# Patient Record
Sex: Female | Born: 1965 | Race: Black or African American | Hispanic: No | Smoking: Never smoker
Health system: Southern US, Community
[De-identification: ages and names within clinical notes are randomized; demographics above are authoritative.]

## PROBLEM LIST (undated history)

## (undated) DIAGNOSIS — E669 Obesity, unspecified: Secondary | ICD-10-CM

## (undated) DIAGNOSIS — F329 Major depressive disorder, single episode, unspecified: Secondary | ICD-10-CM

## (undated) DIAGNOSIS — M1990 Unspecified osteoarthritis, unspecified site: Secondary | ICD-10-CM

## (undated) DIAGNOSIS — K219 Gastro-esophageal reflux disease without esophagitis: Secondary | ICD-10-CM

## (undated) DIAGNOSIS — I10 Essential (primary) hypertension: Secondary | ICD-10-CM

## (undated) DIAGNOSIS — G43909 Migraine, unspecified, not intractable, without status migrainosus: Secondary | ICD-10-CM

## (undated) DIAGNOSIS — F3181 Bipolar II disorder: Secondary | ICD-10-CM

## (undated) DIAGNOSIS — G4730 Sleep apnea, unspecified: Secondary | ICD-10-CM

## (undated) DIAGNOSIS — J329 Chronic sinusitis, unspecified: Secondary | ICD-10-CM

## (undated) DIAGNOSIS — F32A Depression, unspecified: Secondary | ICD-10-CM

## (undated) DIAGNOSIS — M4800 Spinal stenosis, site unspecified: Secondary | ICD-10-CM

## (undated) DIAGNOSIS — M4802 Spinal stenosis, cervical region: Secondary | ICD-10-CM

## (undated) DIAGNOSIS — D259 Leiomyoma of uterus, unspecified: Secondary | ICD-10-CM

## (undated) DIAGNOSIS — G5600 Carpal tunnel syndrome, unspecified upper limb: Secondary | ICD-10-CM

## (undated) HISTORY — PX: ABDOMINAL HYSTERECTOMY: SHX81

## (undated) HISTORY — PX: CARPAL TUNNEL RELEASE: SHX101

## (undated) HISTORY — DX: Leiomyoma of uterus, unspecified: D25.9

## (undated) HISTORY — PX: KNEE SURGERY: SHX244

## (undated) HISTORY — PX: BREAST REDUCTION SURGERY: SHX8

## (undated) HISTORY — DX: Spinal stenosis, cervical region: M48.02

## (undated) HISTORY — PX: MYELOGRAM: SHX5347

---

## 2008-09-17 ENCOUNTER — Emergency Department (HOSPITAL_COMMUNITY): Admission: EM | Admit: 2008-09-17 | Discharge: 2008-09-17 | Payer: Self-pay | Admitting: Family Medicine

## 2008-09-22 ENCOUNTER — Emergency Department (HOSPITAL_COMMUNITY): Admission: EM | Admit: 2008-09-22 | Discharge: 2008-09-22 | Payer: Self-pay | Admitting: Emergency Medicine

## 2008-09-26 ENCOUNTER — Ambulatory Visit (HOSPITAL_COMMUNITY): Admission: RE | Admit: 2008-09-26 | Discharge: 2008-09-26 | Payer: Self-pay | Admitting: Obstetrics

## 2008-11-20 ENCOUNTER — Ambulatory Visit (HOSPITAL_COMMUNITY): Admission: RE | Admit: 2008-11-20 | Discharge: 2008-11-20 | Payer: Self-pay | Admitting: Obstetrics

## 2008-12-28 ENCOUNTER — Emergency Department (HOSPITAL_COMMUNITY): Admission: EM | Admit: 2008-12-28 | Discharge: 2008-12-28 | Payer: Self-pay | Admitting: Emergency Medicine

## 2009-02-08 ENCOUNTER — Emergency Department (HOSPITAL_COMMUNITY): Admission: EM | Admit: 2009-02-08 | Discharge: 2009-02-08 | Payer: Self-pay | Admitting: Emergency Medicine

## 2009-03-14 ENCOUNTER — Emergency Department (HOSPITAL_COMMUNITY): Admission: EM | Admit: 2009-03-14 | Discharge: 2009-03-15 | Payer: Self-pay | Admitting: Emergency Medicine

## 2009-03-31 ENCOUNTER — Emergency Department (HOSPITAL_COMMUNITY): Admission: EM | Admit: 2009-03-31 | Discharge: 2009-03-31 | Payer: Self-pay | Admitting: Emergency Medicine

## 2009-04-04 ENCOUNTER — Emergency Department (HOSPITAL_COMMUNITY): Admission: EM | Admit: 2009-04-04 | Discharge: 2009-04-04 | Payer: Self-pay | Admitting: Emergency Medicine

## 2009-05-01 ENCOUNTER — Encounter: Admission: RE | Admit: 2009-05-01 | Discharge: 2009-05-03 | Payer: Self-pay | Admitting: Orthopaedic Surgery

## 2009-05-08 ENCOUNTER — Encounter: Admission: RE | Admit: 2009-05-08 | Discharge: 2009-05-21 | Payer: Self-pay | Admitting: Orthopaedic Surgery

## 2009-05-29 ENCOUNTER — Encounter: Admission: RE | Admit: 2009-05-29 | Discharge: 2009-05-29 | Payer: Self-pay | Admitting: Orthopaedic Surgery

## 2009-06-07 ENCOUNTER — Emergency Department (HOSPITAL_COMMUNITY): Admission: EM | Admit: 2009-06-07 | Discharge: 2009-06-07 | Payer: Self-pay | Admitting: Emergency Medicine

## 2009-06-22 ENCOUNTER — Emergency Department (HOSPITAL_COMMUNITY): Admission: EM | Admit: 2009-06-22 | Discharge: 2009-06-22 | Payer: Self-pay | Admitting: Emergency Medicine

## 2009-07-25 ENCOUNTER — Ambulatory Visit (HOSPITAL_COMMUNITY): Admission: RE | Admit: 2009-07-25 | Discharge: 2009-07-25 | Payer: Self-pay | Admitting: Obstetrics

## 2009-12-06 ENCOUNTER — Emergency Department (HOSPITAL_COMMUNITY): Admission: EM | Admit: 2009-12-06 | Discharge: 2009-12-07 | Payer: Self-pay | Admitting: General Surgery

## 2010-02-12 ENCOUNTER — Encounter: Admission: RE | Admit: 2010-02-12 | Discharge: 2010-02-12 | Payer: Self-pay | Admitting: Orthopaedic Surgery

## 2010-05-01 ENCOUNTER — Ambulatory Visit (HOSPITAL_COMMUNITY)
Admission: RE | Admit: 2010-05-01 | Discharge: 2010-05-01 | Payer: Self-pay | Source: Home / Self Care | Attending: Obstetrics | Admitting: Obstetrics

## 2010-05-25 ENCOUNTER — Encounter: Payer: Self-pay | Admitting: Gastroenterology

## 2010-06-28 ENCOUNTER — Emergency Department (HOSPITAL_COMMUNITY)
Admission: EM | Admit: 2010-06-28 | Discharge: 2010-06-28 | Disposition: A | Discharge status: Home / Self Care | Payer: Medicaid Other | Attending: Emergency Medicine | Admitting: Emergency Medicine

## 2010-06-28 DIAGNOSIS — R209 Unspecified disturbances of skin sensation: Secondary | ICD-10-CM | POA: Insufficient documentation

## 2010-06-28 DIAGNOSIS — M542 Cervicalgia: Secondary | ICD-10-CM | POA: Insufficient documentation

## 2010-06-28 DIAGNOSIS — M25519 Pain in unspecified shoulder: Secondary | ICD-10-CM | POA: Insufficient documentation

## 2010-06-28 DIAGNOSIS — M545 Low back pain, unspecified: Secondary | ICD-10-CM | POA: Insufficient documentation

## 2010-06-28 DIAGNOSIS — M546 Pain in thoracic spine: Secondary | ICD-10-CM | POA: Insufficient documentation

## 2010-06-28 DIAGNOSIS — I10 Essential (primary) hypertension: Secondary | ICD-10-CM | POA: Insufficient documentation

## 2010-06-28 DIAGNOSIS — M5380 Other specified dorsopathies, site unspecified: Secondary | ICD-10-CM | POA: Insufficient documentation

## 2010-06-28 DIAGNOSIS — E669 Obesity, unspecified: Secondary | ICD-10-CM | POA: Insufficient documentation

## 2010-06-28 DIAGNOSIS — G8929 Other chronic pain: Secondary | ICD-10-CM | POA: Insufficient documentation

## 2010-06-28 DIAGNOSIS — K219 Gastro-esophageal reflux disease without esophagitis: Secondary | ICD-10-CM | POA: Insufficient documentation

## 2010-07-18 LAB — URINALYSIS, ROUTINE W REFLEX MICROSCOPIC
Glucose, UA: NEGATIVE mg/dL
Ketones, ur: NEGATIVE mg/dL
Nitrite: NEGATIVE
Protein, ur: NEGATIVE mg/dL
Urobilinogen, UA: 1 mg/dL (ref 0.0–1.0)

## 2010-07-18 LAB — POCT PREGNANCY, URINE: Preg Test, Ur: NEGATIVE

## 2010-07-19 ENCOUNTER — Emergency Department (HOSPITAL_COMMUNITY): Payer: Medicaid Other

## 2010-07-19 ENCOUNTER — Emergency Department (HOSPITAL_COMMUNITY)
Admission: EM | Admit: 2010-07-19 | Discharge: 2010-07-20 | Disposition: A | Discharge status: Home / Self Care | Payer: Medicaid Other | Attending: Emergency Medicine | Admitting: Emergency Medicine

## 2010-07-19 DIAGNOSIS — I10 Essential (primary) hypertension: Secondary | ICD-10-CM | POA: Insufficient documentation

## 2010-07-19 DIAGNOSIS — R059 Cough, unspecified: Secondary | ICD-10-CM | POA: Insufficient documentation

## 2010-07-19 DIAGNOSIS — R0789 Other chest pain: Secondary | ICD-10-CM | POA: Insufficient documentation

## 2010-07-19 DIAGNOSIS — R0609 Other forms of dyspnea: Secondary | ICD-10-CM | POA: Insufficient documentation

## 2010-07-19 DIAGNOSIS — R05 Cough: Secondary | ICD-10-CM | POA: Insufficient documentation

## 2010-07-19 DIAGNOSIS — K219 Gastro-esophageal reflux disease without esophagitis: Secondary | ICD-10-CM | POA: Insufficient documentation

## 2010-07-19 DIAGNOSIS — R0989 Other specified symptoms and signs involving the circulatory and respiratory systems: Secondary | ICD-10-CM | POA: Insufficient documentation

## 2010-07-19 DIAGNOSIS — R091 Pleurisy: Secondary | ICD-10-CM | POA: Insufficient documentation

## 2010-07-19 LAB — POCT I-STAT, CHEM 8
Calcium, Ion: 1.15 mmol/L (ref 1.12–1.32)
Glucose, Bld: 112 mg/dL — ABNORMAL HIGH (ref 70–99)
HCT: 33 % — ABNORMAL LOW (ref 36.0–46.0)
Hemoglobin: 11.2 g/dL — ABNORMAL LOW (ref 12.0–15.0)
TCO2: 23 mmol/L (ref 0–100)

## 2010-07-19 LAB — CBC
MCV: 90.9 fL (ref 78.0–100.0)
Platelets: 241 10*3/uL (ref 150–400)
RBC: 3.41 MIL/uL — ABNORMAL LOW (ref 3.87–5.11)
RDW: 13.7 % (ref 11.5–15.5)
WBC: 6.3 10*3/uL (ref 4.0–10.5)

## 2010-07-19 LAB — DIFFERENTIAL
Basophils Absolute: 0 10*3/uL (ref 0.0–0.1)
Basophils Relative: 0 % (ref 0–1)
Eosinophils Absolute: 0 10*3/uL (ref 0.0–0.7)
Eosinophils Relative: 1 % (ref 0–5)
Lymphs Abs: 2.4 10*3/uL (ref 0.7–4.0)
Neutrophils Relative %: 56 % (ref 43–77)

## 2010-07-20 LAB — D-DIMER, QUANTITATIVE: D-Dimer, Quant: 0.27 ug/mL-FEU (ref 0.00–0.48)

## 2010-07-23 LAB — CBC
HCT: 35.5 % — ABNORMAL LOW (ref 36.0–46.0)
Hemoglobin: 12.3 g/dL (ref 12.0–15.0)
MCHC: 33.8 g/dL (ref 30.0–36.0)
MCHC: 34.5 g/dL (ref 30.0–36.0)
MCV: 92.5 fL (ref 78.0–100.0)
MCV: 95.2 fL (ref 78.0–100.0)
Platelets: 213 10*3/uL (ref 150–400)
RBC: 3.84 MIL/uL — ABNORMAL LOW (ref 3.87–5.11)
WBC: 5.9 10*3/uL (ref 4.0–10.5)

## 2010-07-23 LAB — URINALYSIS, ROUTINE W REFLEX MICROSCOPIC
Bilirubin Urine: NEGATIVE
Glucose, UA: NEGATIVE mg/dL
Hgb urine dipstick: NEGATIVE
Hgb urine dipstick: NEGATIVE
Nitrite: NEGATIVE
Protein, ur: NEGATIVE mg/dL
Specific Gravity, Urine: 1.023 (ref 1.005–1.030)
Specific Gravity, Urine: 1.029 (ref 1.005–1.030)
Urobilinogen, UA: 1 mg/dL (ref 0.0–1.0)
pH: 6.5 (ref 5.0–8.0)

## 2010-07-23 LAB — COMPREHENSIVE METABOLIC PANEL
ALT: 19 U/L (ref 0–35)
ALT: 19 U/L (ref 0–35)
AST: 18 U/L (ref 0–37)
Albumin: 3.7 g/dL (ref 3.5–5.2)
CO2: 25 mEq/L (ref 19–32)
Calcium: 8.7 mg/dL (ref 8.4–10.5)
Calcium: 8.9 mg/dL (ref 8.4–10.5)
Chloride: 106 mEq/L (ref 96–112)
Creatinine, Ser: 0.58 mg/dL (ref 0.4–1.2)
Creatinine, Ser: 0.64 mg/dL (ref 0.4–1.2)
GFR calc Af Amer: >60 mL/min (ref >60)
GFR calc non Af Amer: >60 mL/min (ref >60)
Glucose, Bld: 103 mg/dL — ABNORMAL HIGH (ref 70–99)
Sodium: 135 mEq/L (ref 135–145)
Sodium: 139 mEq/L (ref 135–145)

## 2010-07-23 LAB — LIPASE, BLOOD: Lipase: 20 U/L (ref 11–59)

## 2010-07-23 LAB — DIFFERENTIAL
Basophils Relative: 0 % (ref 0–1)
Eosinophils Absolute: 0.1 10*3/uL (ref 0.0–0.7)
Eosinophils Relative: 1 % (ref 0–5)
Lymphocytes Relative: 50 % — ABNORMAL HIGH (ref 12–46)
Lymphs Abs: 1.2 10*3/uL (ref 0.7–4.0)
Lymphs Abs: 3 10*3/uL (ref 0.7–4.0)
Monocytes Absolute: 0.5 10*3/uL (ref 0.1–1.0)
Neutrophils Relative %: 41 % — ABNORMAL LOW (ref 43–77)

## 2010-08-05 LAB — COMPREHENSIVE METABOLIC PANEL
ALT: 34 U/L (ref 0–35)
AST: 22 U/L (ref 0–37)
Albumin: 3.8 g/dL (ref 3.5–5.2)
Alkaline Phosphatase: 62 U/L (ref 39–117)
Chloride: 104 mEq/L (ref 96–112)
GFR calc Af Amer: >60 mL/min (ref >60)
Potassium: 3.7 mEq/L (ref 3.5–5.1)
Sodium: 138 mEq/L (ref 135–145)
Total Bilirubin: 0.6 mg/dL (ref 0.3–1.2)

## 2010-08-05 LAB — D-DIMER, QUANTITATIVE: D-Dimer, Quant: <0.22 ug/mL-FEU (ref 0.00–0.48)

## 2010-08-05 LAB — DIFFERENTIAL
Basophils Absolute: 0 10*3/uL (ref 0.0–0.1)
Basophils Relative: 0 % (ref 0–1)
Eosinophils Relative: 0 % (ref 0–5)
Monocytes Absolute: 0.2 10*3/uL (ref 0.1–1.0)

## 2010-08-05 LAB — CBC
HCT: 36.1 % (ref 36.0–46.0)
Platelets: 267 10*3/uL (ref 150–400)
WBC: 7.8 10*3/uL (ref 4.0–10.5)

## 2010-08-05 LAB — POCT CARDIAC MARKERS
CKMB, poc: <1 ng/mL — ABNORMAL LOW (ref 1.0–8.0)
Myoglobin, poc: 53.3 ng/mL (ref 12–200)

## 2010-08-06 LAB — POCT PREGNANCY, URINE: Preg Test, Ur: NEGATIVE

## 2010-08-06 LAB — DIFFERENTIAL
Basophils Relative: 1 % (ref 0–1)
Eosinophils Absolute: 0.1 10*3/uL (ref 0.0–0.7)
Eosinophils Relative: 2 % (ref 0–5)
Monocytes Relative: 9 % (ref 3–12)
Neutrophils Relative %: 41 % — ABNORMAL LOW (ref 43–77)

## 2010-08-06 LAB — BASIC METABOLIC PANEL
BUN: 13 mg/dL (ref 6–23)
CO2: 24 mEq/L (ref 19–32)
Chloride: 105 mEq/L (ref 96–112)
Creatinine, Ser: 0.68 mg/dL (ref 0.4–1.2)
GFR calc Af Amer: >60 mL/min (ref >60)
Potassium: 4.1 mEq/L (ref 3.5–5.1)

## 2010-08-06 LAB — CBC
HCT: 34.5 % — ABNORMAL LOW (ref 36.0–46.0)
MCHC: 34.3 g/dL (ref 30.0–36.0)
MCV: 93.2 fL (ref 78.0–100.0)
RBC: 3.71 MIL/uL — ABNORMAL LOW (ref 3.87–5.11)

## 2010-08-06 LAB — URINALYSIS, ROUTINE W REFLEX MICROSCOPIC
Glucose, UA: NEGATIVE mg/dL
Hgb urine dipstick: NEGATIVE
Specific Gravity, Urine: 1.024 (ref 1.005–1.030)
pH: 7 (ref 5.0–8.0)

## 2010-08-06 LAB — POCT CARDIAC MARKERS
CKMB, poc: <1 ng/mL — ABNORMAL LOW (ref 1.0–8.0)
Myoglobin, poc: 56.7 ng/mL (ref 12–200)
Myoglobin, poc: 60.9 ng/mL (ref 12–200)
Troponin i, poc: <0.05 ng/mL (ref 0.00–0.09)

## 2010-08-09 LAB — CBC
Hemoglobin: 11.6 g/dL — ABNORMAL LOW (ref 12.0–15.0)
Platelets: 191 10*3/uL (ref 150–400)
RDW: 12.9 % (ref 11.5–15.5)

## 2010-08-09 LAB — DIFFERENTIAL
Basophils Absolute: 0 10*3/uL (ref 0.0–0.1)
Lymphocytes Relative: 48 % — ABNORMAL HIGH (ref 12–46)
Neutro Abs: 1.8 10*3/uL (ref 1.7–7.7)

## 2010-08-09 LAB — URINE CULTURE

## 2010-08-09 LAB — URINALYSIS, ROUTINE W REFLEX MICROSCOPIC
Bilirubin Urine: NEGATIVE
Glucose, UA: NEGATIVE mg/dL
Ketones, ur: 15 mg/dL — AB
Protein, ur: NEGATIVE mg/dL

## 2010-08-09 LAB — BASIC METABOLIC PANEL
BUN: 7 mg/dL (ref 6–23)
Calcium: 9.2 mg/dL (ref 8.4–10.5)
GFR calc non Af Amer: >60 mL/min (ref >60)
Glucose, Bld: 93 mg/dL (ref 70–99)
Sodium: 137 mEq/L (ref 135–145)

## 2010-08-09 LAB — GC/CHLAMYDIA PROBE AMP, GENITAL: GC Probe Amp, Genital: NEGATIVE

## 2010-08-09 LAB — WET PREP, GENITAL: Yeast Wet Prep HPF POC: NONE SEEN

## 2010-08-09 LAB — URINE MICROSCOPIC-ADD ON

## 2010-08-09 LAB — PREGNANCY, URINE: Preg Test, Ur: NEGATIVE

## 2010-08-12 LAB — URINALYSIS, ROUTINE W REFLEX MICROSCOPIC
Nitrite: NEGATIVE
Specific Gravity, Urine: 1.031 — ABNORMAL HIGH (ref 1.005–1.030)
Urobilinogen, UA: 1 mg/dL (ref 0.0–1.0)

## 2010-08-12 LAB — WET PREP, GENITAL: Yeast Wet Prep HPF POC: NONE SEEN

## 2010-08-12 LAB — URINE MICROSCOPIC-ADD ON

## 2010-08-12 LAB — POCT PREGNANCY, URINE: Preg Test, Ur: NEGATIVE

## 2010-08-12 LAB — GC/CHLAMYDIA PROBE AMP, GENITAL: GC Probe Amp, Genital: NEGATIVE

## 2010-08-16 IMAGING — CR DG CHEST 2V
2 series · 2 of 2 positions shown · non-contrast
Comparison: 03/14/2009

CLINICAL DATA: Shortness of breath.

CHEST - 2 VIEW

[w chest pa]
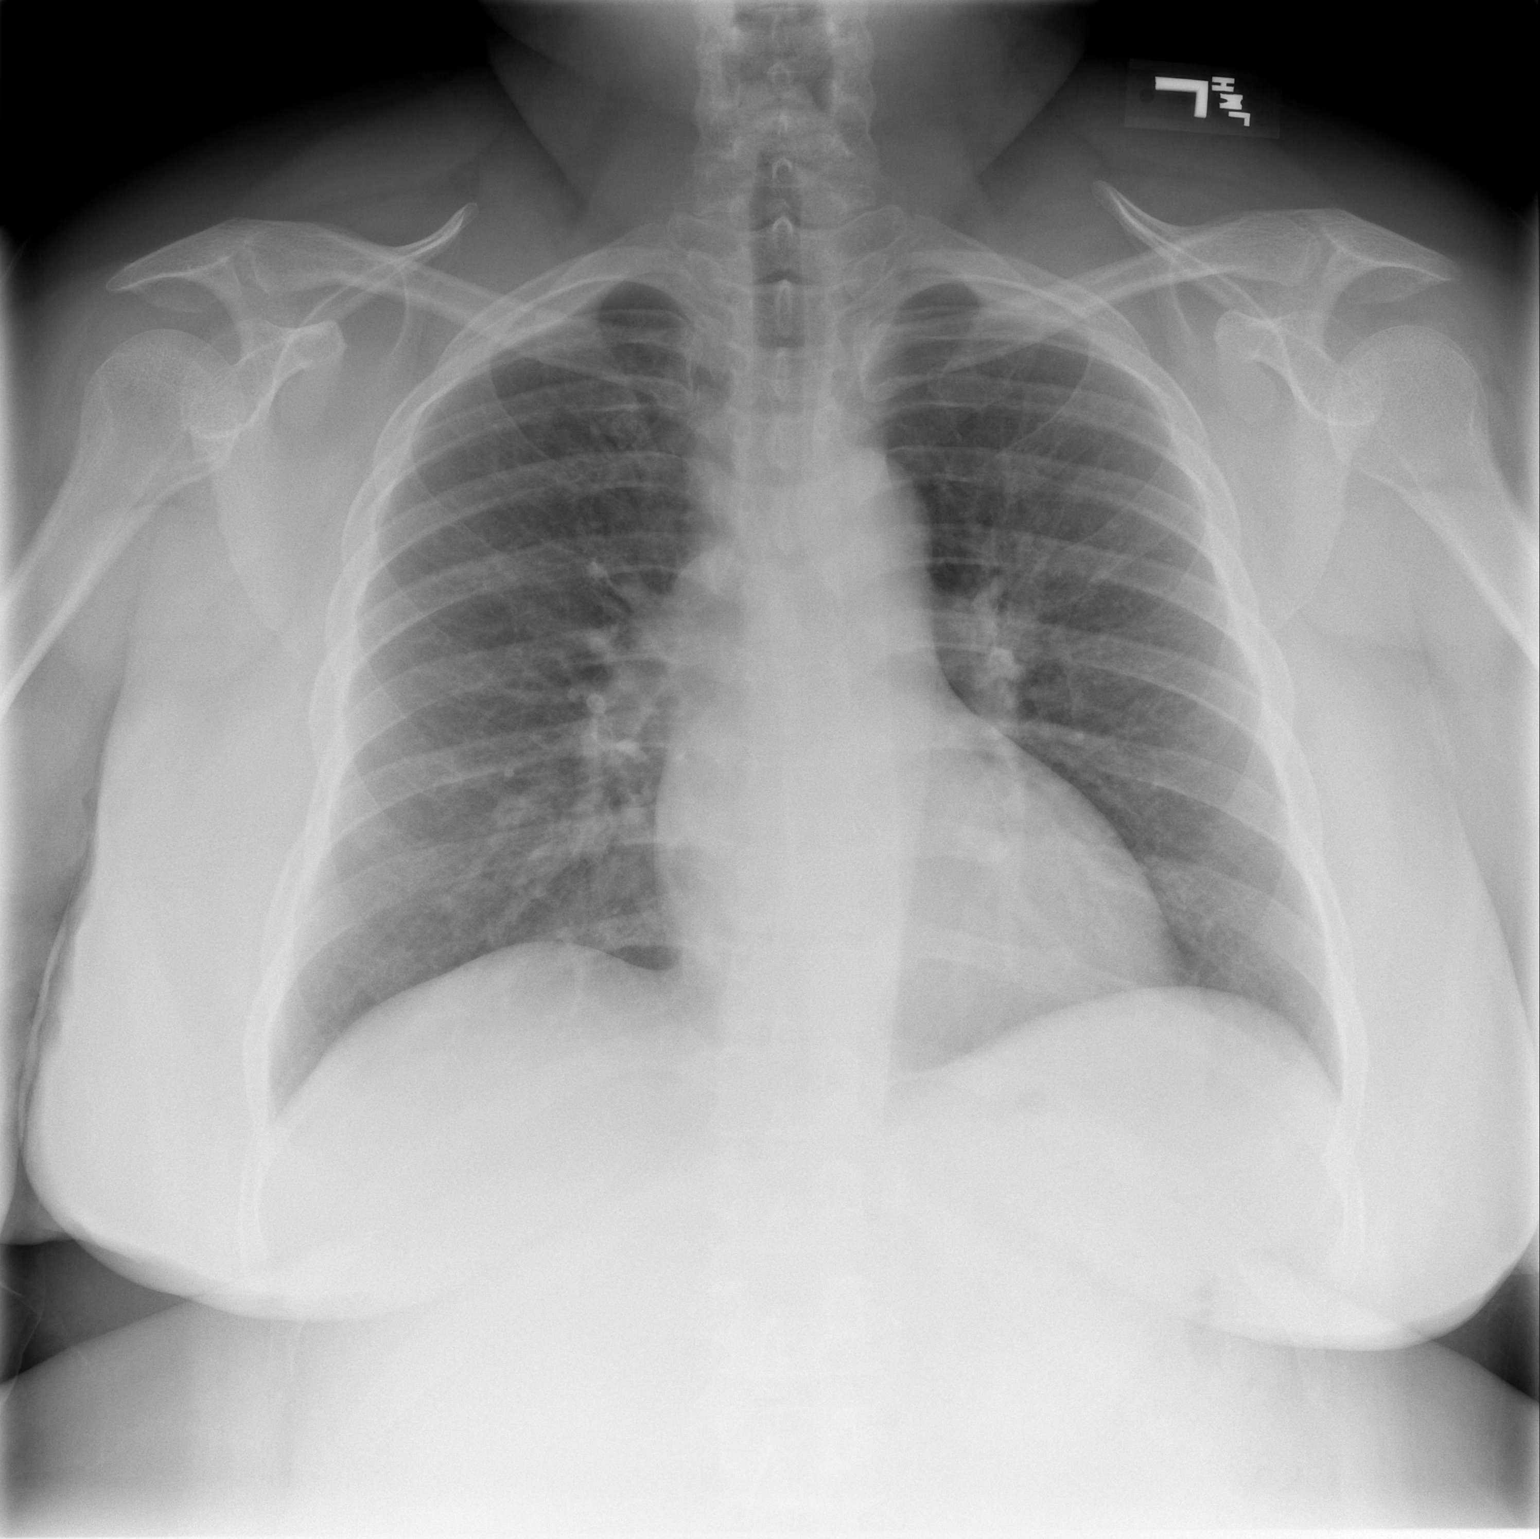

[w chest lat]
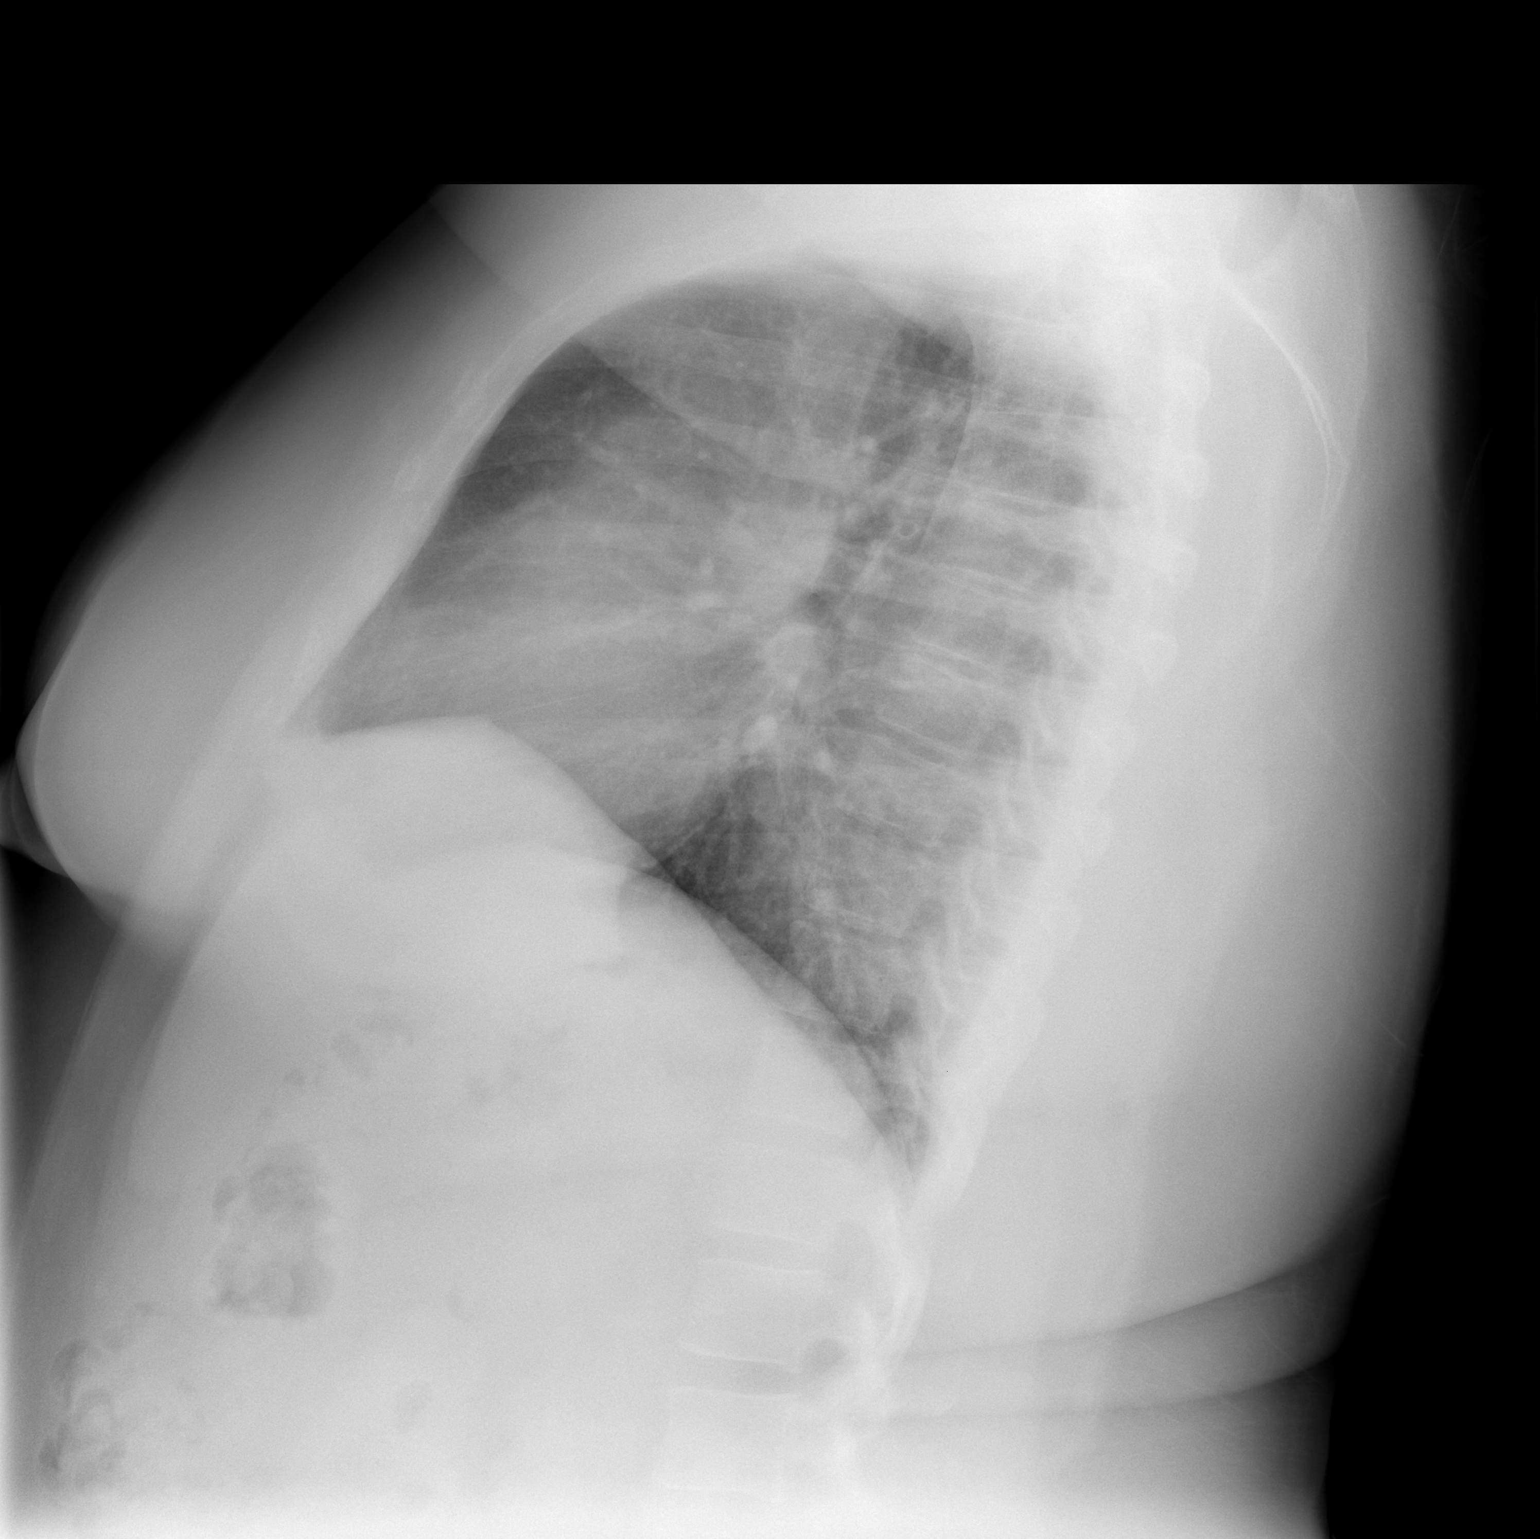

[2 of 2 positions shown; findings below may reference images not displayed]

FINDINGS: The cardiac silhouette, mediastinal and hilar contours
are within normal limits and stable.  There are mild bronchitic
type lung changes which may suggest bronchitis or reactive airways
disease.  No focal infiltrates or pleural effusion.  The bony
thorax is intact.
IMPRESSION: Bronchitic type lung changes suggesting bronchitis or reactive
airways disease.  No focal infiltrate.

## 2010-08-29 ENCOUNTER — Emergency Department (HOSPITAL_COMMUNITY)
Admission: EM | Admit: 2010-08-29 | Discharge: 2010-08-29 | Disposition: A | Discharge status: Home / Self Care | Payer: Medicaid Other | Attending: Emergency Medicine | Admitting: Emergency Medicine

## 2010-08-29 DIAGNOSIS — M546 Pain in thoracic spine: Secondary | ICD-10-CM | POA: Insufficient documentation

## 2010-08-29 DIAGNOSIS — I10 Essential (primary) hypertension: Secondary | ICD-10-CM | POA: Insufficient documentation

## 2010-08-29 DIAGNOSIS — M5430 Sciatica, unspecified side: Secondary | ICD-10-CM | POA: Insufficient documentation

## 2010-08-29 DIAGNOSIS — G8929 Other chronic pain: Secondary | ICD-10-CM | POA: Insufficient documentation

## 2010-08-29 DIAGNOSIS — Z79899 Other long term (current) drug therapy: Secondary | ICD-10-CM | POA: Insufficient documentation

## 2010-08-29 DIAGNOSIS — M542 Cervicalgia: Secondary | ICD-10-CM | POA: Insufficient documentation

## 2010-08-29 DIAGNOSIS — M545 Low back pain, unspecified: Secondary | ICD-10-CM | POA: Insufficient documentation

## 2010-08-29 DIAGNOSIS — K219 Gastro-esophageal reflux disease without esophagitis: Secondary | ICD-10-CM | POA: Insufficient documentation

## 2010-11-07 IMAGING — CR DG CHEST 2V
2 series · 2 of 2 positions shown · non-contrast
Comparison: 04/04/2009

CLINICAL DATA: Cough and shortness of breath.  Fever.  Chest pain.

CHEST - 2 VIEW

[w chest pa]
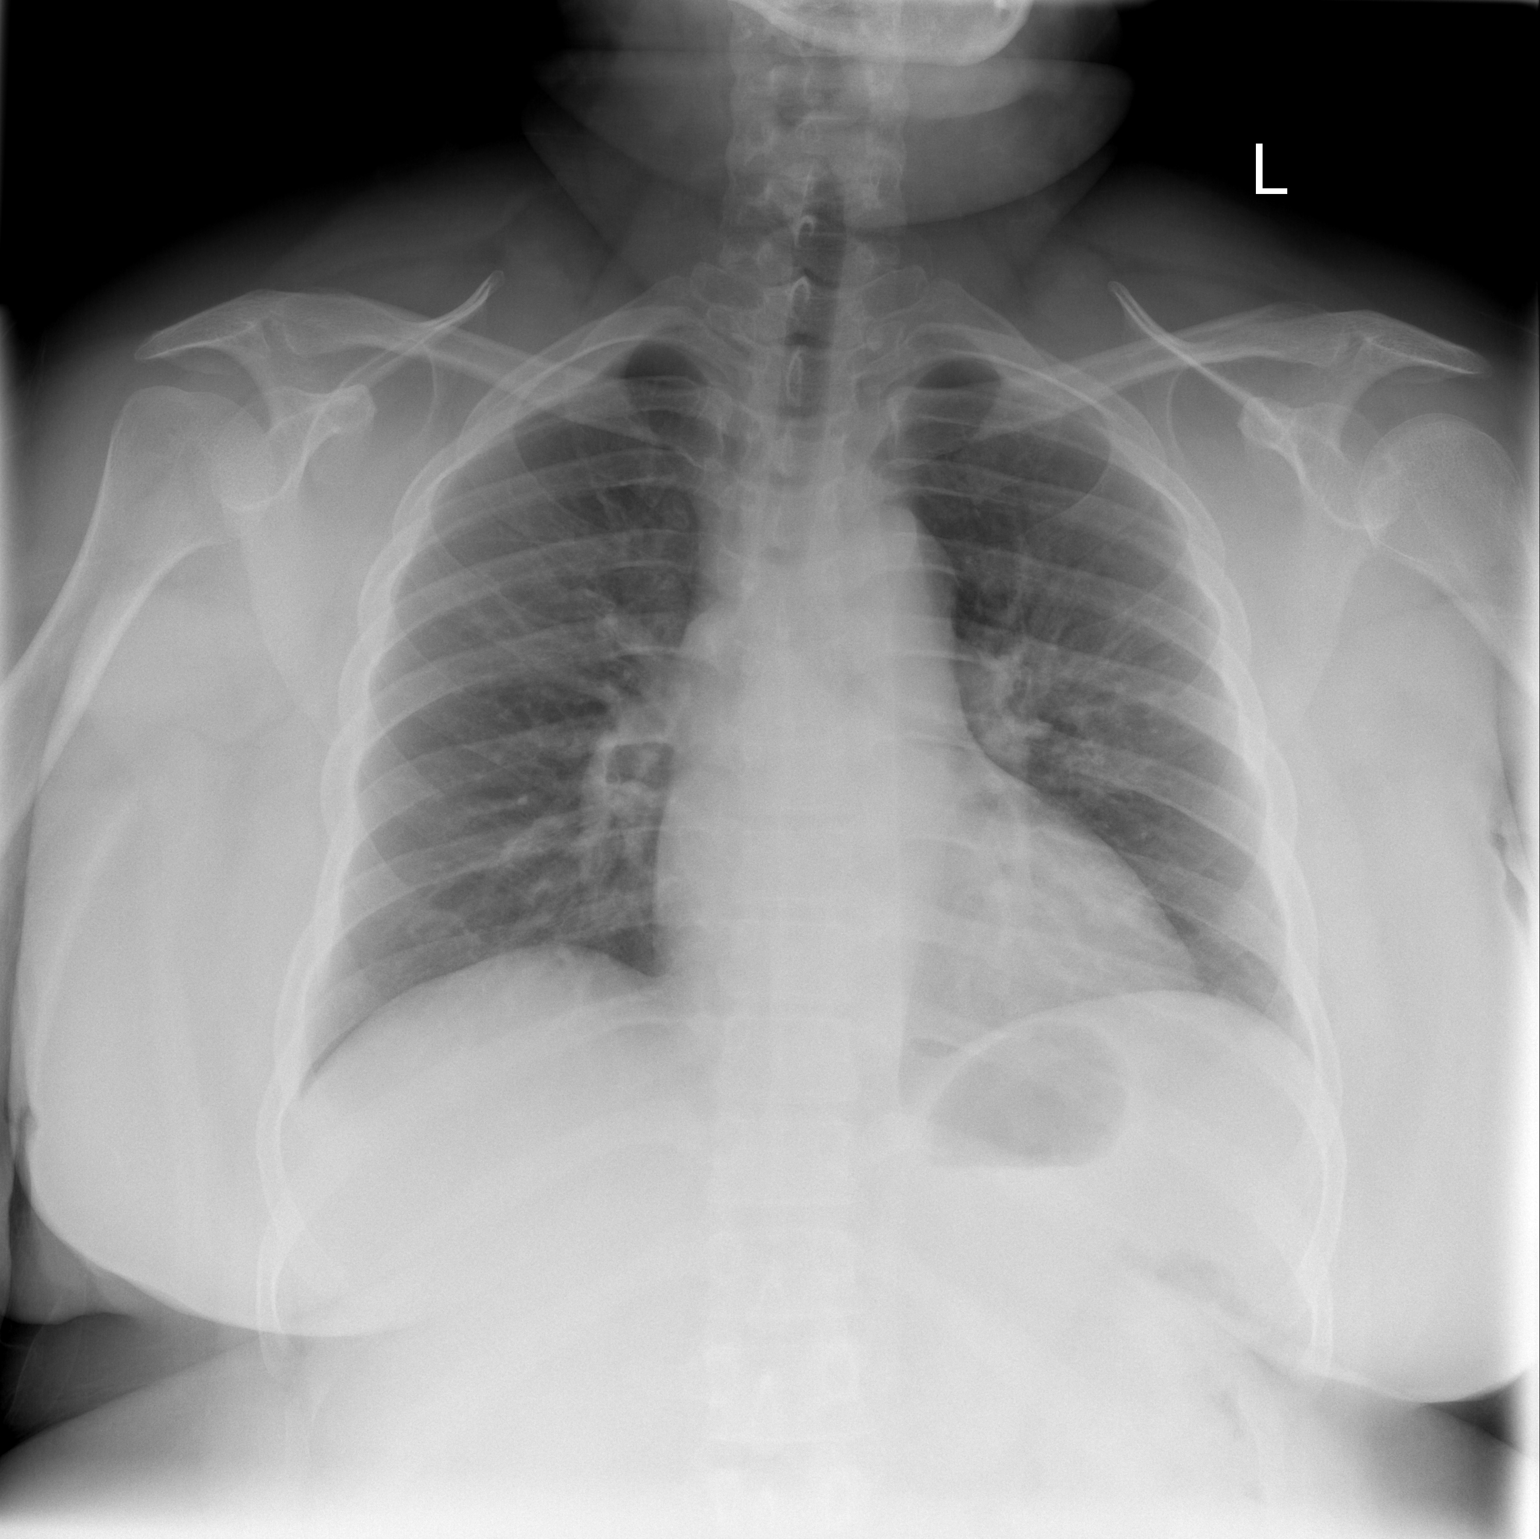

[w chest lat]
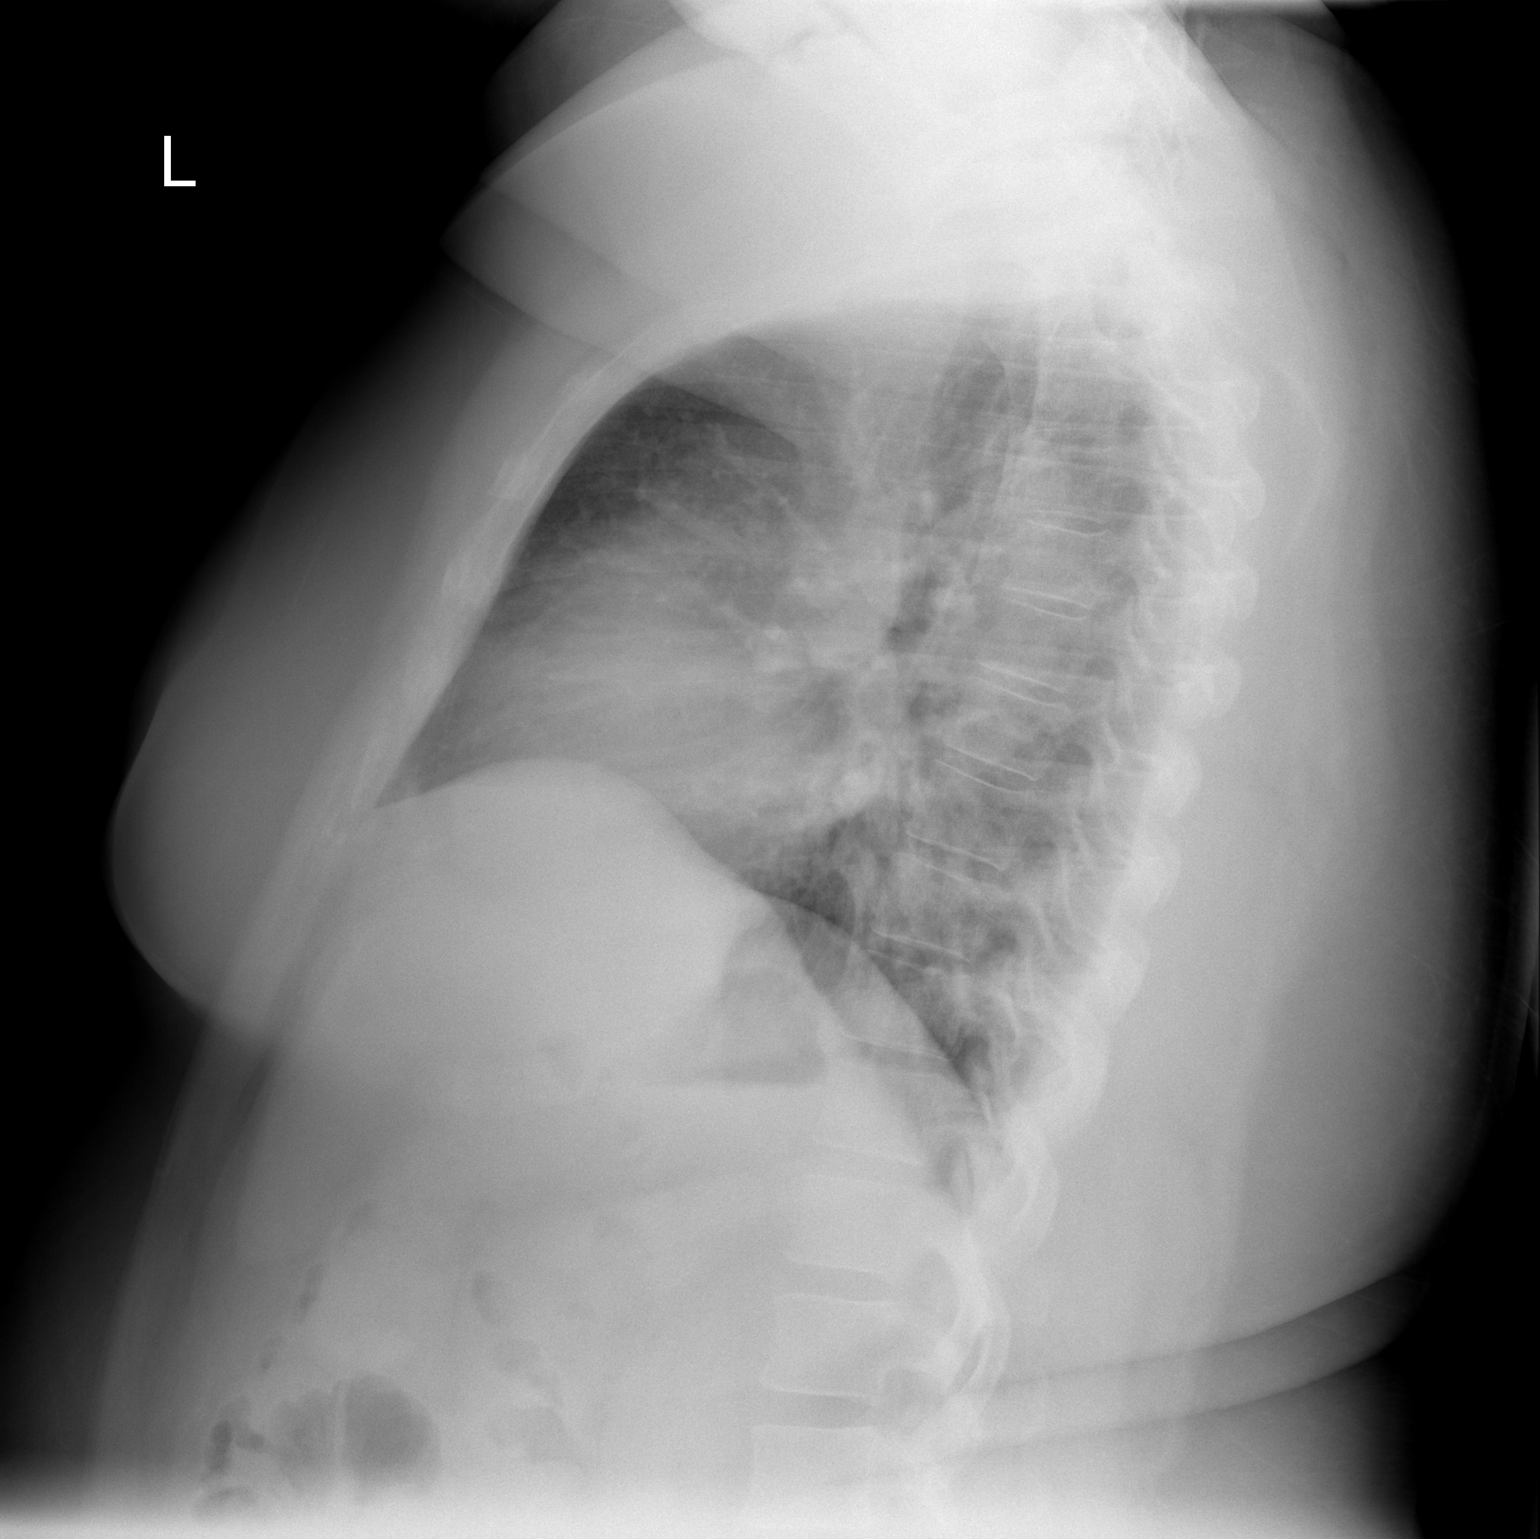

[2 of 2 positions shown; findings below may reference images not displayed]

FINDINGS: The cardiomediastinal silhouette is unremarkable.
Mild peribronchial thickening is stable.
There is no evidence of focal airspace disease, pulmonary edema,
pleural effusion, or pneumothorax.
No acute bony abnormalities are identified.
IMPRESSION: No evidence of acute cardiopulmonary disease.

Mild chronic peribronchial thickening.

## 2010-12-18 ENCOUNTER — Emergency Department (HOSPITAL_COMMUNITY)
Admission: EM | Admit: 2010-12-18 | Discharge: 2010-12-18 | Disposition: A | Discharge status: Home / Self Care | Payer: Medicaid Other | Attending: Emergency Medicine | Admitting: Emergency Medicine

## 2010-12-18 DIAGNOSIS — I10 Essential (primary) hypertension: Secondary | ICD-10-CM | POA: Insufficient documentation

## 2010-12-18 DIAGNOSIS — K219 Gastro-esophageal reflux disease without esophagitis: Secondary | ICD-10-CM | POA: Insufficient documentation

## 2010-12-18 DIAGNOSIS — M545 Low back pain, unspecified: Secondary | ICD-10-CM | POA: Insufficient documentation

## 2010-12-18 DIAGNOSIS — Z79899 Other long term (current) drug therapy: Secondary | ICD-10-CM | POA: Insufficient documentation

## 2010-12-18 DIAGNOSIS — M5430 Sciatica, unspecified side: Secondary | ICD-10-CM | POA: Insufficient documentation

## 2010-12-18 DIAGNOSIS — G8929 Other chronic pain: Secondary | ICD-10-CM | POA: Insufficient documentation

## 2010-12-18 LAB — URINALYSIS, ROUTINE W REFLEX MICROSCOPIC
Hgb urine dipstick: NEGATIVE
Protein, ur: NEGATIVE mg/dL
Urobilinogen, UA: 2 mg/dL — ABNORMAL HIGH (ref 0.0–1.0)

## 2011-03-18 ENCOUNTER — Other Ambulatory Visit (HOSPITAL_COMMUNITY): Payer: Self-pay | Admitting: Internal Medicine

## 2011-03-18 DIAGNOSIS — Z1231 Encounter for screening mammogram for malignant neoplasm of breast: Secondary | ICD-10-CM

## 2011-04-16 ENCOUNTER — Ambulatory Visit (HOSPITAL_COMMUNITY)
Admission: RE | Admit: 2011-04-16 | Discharge: 2011-04-16 | Disposition: A | Discharge status: Home / Self Care | Payer: Medicaid Other | Source: Ambulatory Visit | Attending: Internal Medicine | Admitting: Internal Medicine

## 2011-04-16 DIAGNOSIS — Z1231 Encounter for screening mammogram for malignant neoplasm of breast: Secondary | ICD-10-CM | POA: Insufficient documentation

## 2011-05-18 ENCOUNTER — Encounter: Payer: Medicaid Other | Admitting: *Deleted

## 2011-05-18 ENCOUNTER — Ambulatory Visit: Payer: Medicaid Other

## 2011-05-26 ENCOUNTER — Ambulatory Visit: Payer: Medicaid Other | Attending: Neurosurgery | Admitting: *Deleted

## 2011-05-26 DIAGNOSIS — M6281 Muscle weakness (generalized): Secondary | ICD-10-CM | POA: Insufficient documentation

## 2011-05-26 DIAGNOSIS — M25549 Pain in joints of unspecified hand: Secondary | ICD-10-CM | POA: Insufficient documentation

## 2011-05-26 DIAGNOSIS — R279 Unspecified lack of coordination: Secondary | ICD-10-CM | POA: Insufficient documentation

## 2011-05-26 DIAGNOSIS — IMO0001 Reserved for inherently not codable concepts without codable children: Secondary | ICD-10-CM | POA: Insufficient documentation

## 2011-05-29 ENCOUNTER — Encounter: Payer: Medicaid Other | Admitting: Occupational Therapy

## 2011-05-29 ENCOUNTER — Ambulatory Visit: Payer: Medicaid Other | Admitting: *Deleted

## 2011-06-01 ENCOUNTER — Encounter: Payer: Medicaid Other | Admitting: Occupational Therapy

## 2011-06-03 ENCOUNTER — Encounter: Payer: Medicaid Other | Admitting: Occupational Therapy

## 2011-06-08 ENCOUNTER — Ambulatory Visit: Payer: Medicaid Other | Attending: Neurosurgery | Admitting: Occupational Therapy

## 2011-06-08 DIAGNOSIS — M25549 Pain in joints of unspecified hand: Secondary | ICD-10-CM | POA: Insufficient documentation

## 2011-06-08 DIAGNOSIS — R279 Unspecified lack of coordination: Secondary | ICD-10-CM | POA: Insufficient documentation

## 2011-06-08 DIAGNOSIS — IMO0001 Reserved for inherently not codable concepts without codable children: Secondary | ICD-10-CM | POA: Insufficient documentation

## 2011-06-08 DIAGNOSIS — M6281 Muscle weakness (generalized): Secondary | ICD-10-CM | POA: Insufficient documentation

## 2011-06-10 ENCOUNTER — Encounter: Payer: Medicaid Other | Admitting: Occupational Therapy

## 2011-06-15 ENCOUNTER — Encounter: Payer: Medicaid Other | Admitting: Occupational Therapy

## 2011-06-17 ENCOUNTER — Encounter: Payer: Medicaid Other | Admitting: Occupational Therapy

## 2011-06-20 ENCOUNTER — Emergency Department (HOSPITAL_COMMUNITY)
Admission: EM | Admit: 2011-06-20 | Discharge: 2011-06-21 | Disposition: A | Discharge status: Home / Self Care | Payer: Medicaid Other | Attending: Emergency Medicine | Admitting: Emergency Medicine

## 2011-06-20 ENCOUNTER — Encounter (HOSPITAL_COMMUNITY): Payer: Self-pay | Admitting: Emergency Medicine

## 2011-06-20 DIAGNOSIS — M129 Arthropathy, unspecified: Secondary | ICD-10-CM | POA: Insufficient documentation

## 2011-06-20 DIAGNOSIS — IMO0001 Reserved for inherently not codable concepts without codable children: Secondary | ICD-10-CM | POA: Insufficient documentation

## 2011-06-20 DIAGNOSIS — R070 Pain in throat: Secondary | ICD-10-CM | POA: Insufficient documentation

## 2011-06-20 DIAGNOSIS — I10 Essential (primary) hypertension: Secondary | ICD-10-CM | POA: Insufficient documentation

## 2011-06-20 DIAGNOSIS — F3181 Bipolar II disorder: Secondary | ICD-10-CM | POA: Insufficient documentation

## 2011-06-20 DIAGNOSIS — H409 Unspecified glaucoma: Secondary | ICD-10-CM | POA: Insufficient documentation

## 2011-06-20 DIAGNOSIS — J3489 Other specified disorders of nose and nasal sinuses: Secondary | ICD-10-CM | POA: Insufficient documentation

## 2011-06-20 DIAGNOSIS — R05 Cough: Secondary | ICD-10-CM | POA: Insufficient documentation

## 2011-06-20 DIAGNOSIS — H9209 Otalgia, unspecified ear: Secondary | ICD-10-CM | POA: Insufficient documentation

## 2011-06-20 DIAGNOSIS — Z79899 Other long term (current) drug therapy: Secondary | ICD-10-CM | POA: Insufficient documentation

## 2011-06-20 DIAGNOSIS — F329 Major depressive disorder, single episode, unspecified: Secondary | ICD-10-CM | POA: Insufficient documentation

## 2011-06-20 DIAGNOSIS — B9789 Other viral agents as the cause of diseases classified elsewhere: Secondary | ICD-10-CM

## 2011-06-20 DIAGNOSIS — F3130 Bipolar disorder, current episode depressed, mild or moderate severity, unspecified: Secondary | ICD-10-CM | POA: Insufficient documentation

## 2011-06-20 DIAGNOSIS — J329 Chronic sinusitis, unspecified: Secondary | ICD-10-CM | POA: Insufficient documentation

## 2011-06-20 DIAGNOSIS — J988 Other specified respiratory disorders: Secondary | ICD-10-CM

## 2011-06-20 DIAGNOSIS — R509 Fever, unspecified: Secondary | ICD-10-CM | POA: Insufficient documentation

## 2011-06-20 DIAGNOSIS — M4800 Spinal stenosis, site unspecified: Secondary | ICD-10-CM | POA: Insufficient documentation

## 2011-06-20 DIAGNOSIS — G5600 Carpal tunnel syndrome, unspecified upper limb: Secondary | ICD-10-CM | POA: Insufficient documentation

## 2011-06-20 DIAGNOSIS — F32A Depression, unspecified: Secondary | ICD-10-CM | POA: Insufficient documentation

## 2011-06-20 DIAGNOSIS — R059 Cough, unspecified: Secondary | ICD-10-CM | POA: Insufficient documentation

## 2011-06-20 HISTORY — DX: Carpal tunnel syndrome, unspecified upper limb: G56.00

## 2011-06-20 HISTORY — DX: Essential (primary) hypertension: I10

## 2011-06-20 HISTORY — DX: Bipolar II disorder: F31.81

## 2011-06-20 HISTORY — DX: Spinal stenosis, site unspecified: M48.00

## 2011-06-20 HISTORY — DX: Unspecified osteoarthritis, unspecified site: M19.90

## 2011-06-20 HISTORY — DX: Chronic sinusitis, unspecified: J32.9

## 2011-06-20 HISTORY — DX: Major depressive disorder, single episode, unspecified: F32.9

## 2011-06-20 HISTORY — DX: Depression, unspecified: F32.A

## 2011-06-20 MED ORDER — ACETAMINOPHEN-CODEINE #3 300-30 MG PO TABS
1.0000 | ORAL_TABLET | Freq: Once | ORAL | Status: AC
  Administered 2011-06-21: 1 via ORAL
  Filled 2011-06-20: qty 1

## 2011-06-20 NOTE — ED Provider Notes (Signed)
History     CSN: [id number]  Arrival date & time 06/20/11  2233   First MD Initiated Contact with Patient 06/20/11 2325      Chief Complaint  Patient presents with  . Influenza    (Consider location/radiation/quality/duration/timing/severity/associated sxs/prior treatment) HPI Dry cough, body aches, fever today up to 102 at home, sore throat and flulike symptoms for the last week. Patient saw her primary care physician 5 days ago in clinic and finished a Z-Pak today. Symptoms persistent and unchanged. No difficulty breathing. No chest pains. No abdominal pain. No nausea vomiting or diarrhea. No known sick contacts. Did not get a flu shot this season. Patient complaining of difficulty sleeping due to cough and not feeling well. Moderate in severity.sore throat described as scratchy without trouble swallowing. It feels better to drink cold fluids.    Past Medical History  Diagnosis Date  . Sinus infection   . Hypertension   . Arthritis   . Spinal stenosis   . Glaucoma   . Carpal tunnel syndrome   . Depression   . Bipolar 2 disorder     Past Surgical History  Procedure Date  . Carpal tunnel release     Left Hand  . Abdominal hysterectomy     History reviewed. No pertinent family history.  History  Substance Use Topics  . Smoking status: Never Smoker   . Smokeless tobacco: Not on file  . Alcohol Use: No    OB History    Grav Para Term Preterm Abortions TAB SAB Ect Mult Living                  Review of Systems  Constitutional: Positive for fever and chills.  HENT: Positive for ear pain, congestion and sore throat. Negative for drooling, mouth sores, trouble swallowing, neck pain and neck stiffness.   Eyes: Negative for pain.  Respiratory: Positive for cough. Negative for shortness of breath.   Cardiovascular: Negative for chest pain, palpitations and leg swelling.  Gastrointestinal: Negative for abdominal pain.  Genitourinary: Negative for dysuria.    Musculoskeletal: Negative for back pain.  Skin: Negative for rash.  Neurological: Negative for syncope and headaches.  All other systems reviewed and are negative.    Allergies  Ibuprofen  Home Medications   Current Outpatient Rx  Name Route Sig Dispense Refill  . BUSPIRONE HCL 7.5 MG PO TABS Oral Take 7.5 mg by mouth 2 (two) times daily.    Marland Kitchen FLUOXETINE HCL 40 MG PO CAPS Oral Take 40 mg by mouth daily.    Marland Kitchen HYDROCODONE-ACETAMINOPHEN 7.5-325 MG PO TABS Oral Take 1 tablet by mouth 3 (three) times daily as needed. For pain    . LATANOPROST 0.005 % OP SOLN Both Eyes Place 1 drop into both eyes every morning.    Marland Kitchen LISINOPRIL 40 MG PO TABS Oral Take 40 mg by mouth daily.    . TOPIRAMATE 50 MG PO TABS Oral Take 50 mg by mouth 2 (two) times daily.      Pulse 86  Temp(Src) 99.1 F (37.3 C) (Oral)  Resp 18  SpO2 98%  Physical Exam  Constitutional: She is oriented to person, place, and time. She appears well-developed and well-nourished.  HENT:  Head: Normocephalic and atraumatic.  Right Ear: External ear normal.  Left Ear: External ear normal.       Mild posterior pharynx erythema without exudates. Uvula midline. Nasal congestion.  Eyes: Conjunctivae and EOM are normal. Pupils are equal, round, and reactive to  light. No scleral icterus.  Neck: Trachea normal. Neck supple.  Cardiovascular: Normal rate, regular rhythm, S1 normal, S2 normal and normal pulses.     No systolic murmur is present   No diastolic murmur is present  Pulses:      Radial pulses are 2+ on the right side, and 2+ on the left side.  Pulmonary/Chest: Effort normal and breath sounds normal. She has no wheezes. She has no rhonchi. She has no rales. She exhibits no tenderness.  Abdominal: Soft. Normal appearance and bowel sounds are normal. There is no tenderness. There is no rebound, no CVA tenderness and negative Murphy's sign.  Musculoskeletal:       BLE:s Calves nontender, no cords or erythema, negative Homans  sign  Lymphadenopathy:    She has no cervical adenopathy.  Neurological: She is alert and oriented to person, place, and time. She has normal strength. No cranial nerve deficit or sensory deficit. GCS eye subscore is 4. GCS verbal subscore is 5. GCS motor subscore is 6.  Skin: Skin is warm and dry. No rash noted. She is not diaphoretic.  Psychiatric: Her speech is normal.       Cooperative and appropriate    ED Course  Procedures (including critical care time)  PO fluids. Tylenol  c codeine.   MDM   URI / Flulike symptoms unchanged with a Z-Pak. No hypoxia with room air sats 98%- adequate. Lungs sounds clear. No indication for chest x-ray this time. Plan primary care followup as needed. Reliable historian verbalizes understanding viral precautions.         Sunnie Nielsen, MD 06/21/11 [phone]

## 2011-06-20 NOTE — ED Notes (Signed)
Patient complaining of multiple flu-like symptoms (sore throat, non-productive cough, headache, chills, and fever).  Patient started on a Z-Pak on Tuesday by her PCP due to an inflamed throat; patient states that she does not feel any better.  Given mask to wear.

## 2011-06-21 MED ORDER — ACETAMINOPHEN-CODEINE 120-12 MG/5ML PO SUSP: 5.0000 mL | Freq: Three times a day (TID) | ORAL | Status: AC | PRN

## 2011-06-22 ENCOUNTER — Encounter: Payer: Medicaid Other | Admitting: Occupational Therapy

## 2011-06-24 ENCOUNTER — Encounter: Payer: Medicaid Other | Admitting: Occupational Therapy

## 2011-08-03 ENCOUNTER — Other Ambulatory Visit: Payer: Self-pay | Admitting: Obstetrics

## 2011-08-05 ENCOUNTER — Emergency Department (HOSPITAL_COMMUNITY)
Admission: EM | Admit: 2011-08-05 | Discharge: 2011-08-06 | Disposition: A | Discharge status: Home / Self Care | Payer: Medicaid Other | Attending: Emergency Medicine | Admitting: Emergency Medicine

## 2011-08-05 ENCOUNTER — Encounter (HOSPITAL_COMMUNITY): Payer: Self-pay | Admitting: Emergency Medicine

## 2011-08-05 DIAGNOSIS — R079 Chest pain, unspecified: Secondary | ICD-10-CM | POA: Insufficient documentation

## 2011-08-05 DIAGNOSIS — M549 Dorsalgia, unspecified: Secondary | ICD-10-CM

## 2011-08-05 DIAGNOSIS — M4800 Spinal stenosis, site unspecified: Secondary | ICD-10-CM | POA: Insufficient documentation

## 2011-08-05 DIAGNOSIS — R509 Fever, unspecified: Secondary | ICD-10-CM | POA: Insufficient documentation

## 2011-08-05 DIAGNOSIS — F3130 Bipolar disorder, current episode depressed, mild or moderate severity, unspecified: Secondary | ICD-10-CM | POA: Insufficient documentation

## 2011-08-05 DIAGNOSIS — Z79899 Other long term (current) drug therapy: Secondary | ICD-10-CM | POA: Insufficient documentation

## 2011-08-05 DIAGNOSIS — M546 Pain in thoracic spine: Secondary | ICD-10-CM | POA: Insufficient documentation

## 2011-08-05 DIAGNOSIS — I10 Essential (primary) hypertension: Secondary | ICD-10-CM | POA: Insufficient documentation

## 2011-08-05 DIAGNOSIS — M129 Arthropathy, unspecified: Secondary | ICD-10-CM | POA: Insufficient documentation

## 2011-08-05 HISTORY — DX: Obesity, unspecified: E66.9

## 2011-08-05 NOTE — ED Notes (Signed)
PT. REPORTS UPPER AND MID BACK PAIN WITH STIFFNESS ONSET LAST Monday , DENIES INJURY OR FALL , STATES HISTORY OF ARTHRITIS.

## 2011-08-06 ENCOUNTER — Emergency Department (HOSPITAL_COMMUNITY): Payer: Medicaid Other

## 2011-08-06 MED ORDER — HYDROMORPHONE HCL PF 2 MG/ML IJ SOLN
2.0000 mg | Freq: Once | INTRAMUSCULAR | Status: AC
  Administered 2011-08-06: 2 mg via INTRAMUSCULAR
  Filled 2011-08-06: qty 1

## 2011-08-06 MED ORDER — METAXALONE 800 MG PO TABS: 800.0000 mg | ORAL_TABLET | Freq: Three times a day (TID) | ORAL | Status: AC

## 2011-08-06 MED ORDER — ONDANSETRON 4 MG PO TBDP
8.0000 mg | ORAL_TABLET | Freq: Once | ORAL | Status: AC
  Administered 2011-08-06: 8 mg via ORAL
  Filled 2011-08-06: qty 2

## 2011-08-06 NOTE — ED Provider Notes (Signed)
History     CSN: [id number]  Arrival date & time 08/05/11  2219   First MD Initiated Contact with Patient 08/05/11 2356      Chief Complaint  Patient presents with  . Back Pain    (Consider location/radiation/quality/duration/timing/severity/associated sxs/prior treatment) HPI Comments: Patient has a history of spinal stenosis, and arthritis noticed on Tuesday.  She had a fever, unsure of exact level.  She was fine on Wednesday, but did develop thoracic back pain, which has persisted  Patient is a 46 y.o. female presenting with back pain. The history is provided by the patient.  Back Pain  This is a new problem. The current episode started more than 2 days ago. Associated symptoms include a fever. Pertinent negatives include no headaches.    Past Medical History  Diagnosis Date  . Sinus infection   . Hypertension   . Arthritis   . Spinal stenosis   . Glaucoma   . Carpal tunnel syndrome   . Depression   . Bipolar 2 disorder   . Obesity   . Arthritis   . Glaucoma     Past Surgical History  Procedure Date  . Carpal tunnel release     Left Hand  . Abdominal hysterectomy   . Knee surgery   . Glaucoma surgery     No family history on file.  History  Substance Use Topics  . Smoking status: Never Smoker   . Smokeless tobacco: Not on file  . Alcohol Use: No    OB History    Grav Para Term Preterm Abortions TAB SAB Ect Mult Living                  Review of Systems  Constitutional: Positive for fever and chills.  HENT: Negative for nosebleeds and congestion.   Respiratory: Negative for shortness of breath.   Musculoskeletal: Positive for back pain.  Neurological: Negative for headaches.    Allergies  Ibuprofen  Home Medications   Current Outpatient Rx  Name Route Sig Dispense Refill  . BUSPIRONE HCL 7.5 MG PO TABS Oral Take 7.5 mg by mouth 2 (two) times daily.    Marland Kitchen FLUOXETINE HCL 40 MG PO CAPS Oral Take 40 mg by mouth daily.    Marland Kitchen  HYDROCODONE-ACETAMINOPHEN 7.5-325 MG PO TABS Oral Take 1 tablet by mouth 3 (three) times daily as needed. For pain    . LATANOPROST 0.005 % OP SOLN Both Eyes Place 1 drop into both eyes every morning.    Marland Kitchen LISINOPRIL 40 MG PO TABS Oral Take 40 mg by mouth daily.    Marland Kitchen METAXALONE 800 MG PO TABS Oral Take 1 tablet (800 mg total) by mouth 3 (three) times daily. 21 tablet 0    BP 114/71  Pulse 73  Temp(Src) 98.6 F (37 C) (Oral)  Resp 20  SpO2 99%  Physical Exam  Constitutional: She is oriented to person, place, and time. She appears well-developed.  HENT:  Head: Normocephalic.  Neck: Normal range of motion.  Cardiovascular: Normal rate.   Pulmonary/Chest: Effort normal.  Musculoskeletal:       Back:  Neurological: She is alert and oriented to person, place, and time.    ED Course  Procedures (including critical care time)  Labs Reviewed - No data to display Dg Chest 2 View  08/06/2011  *RADIOLOGY REPORT*  Clinical Data: Chest pain and fever.  CHEST - 2 VIEW  Comparison: 07/20/2010.  Findings: The cardiac silhouette, mediastinal and hilar contours are  within normal limits and stable.  The lungs are clear.  No pleural effusion.  The bony thorax is intact.  IMPRESSION: Normal chest x-ray.  Original Report Authenticated By: P. Loralie Champagne, M.D.     1. Back pain       MDM   Due to fever and change in location of her chronic, pain.  We'll x-ray to rule out pneumonia        Arman Filter, NP 08/06/11 0114  Arman Filter, NP 08/06/11 [phone]

## 2011-08-06 NOTE — ED Provider Notes (Signed)
Medical screening examination/treatment/procedure(s) were performed by non-physician practitioner and as supervising physician I was immediately available for consultation/collaboration.  Olivia Mackie, MD 08/06/11 [phone]

## 2011-08-06 NOTE — Discharge Instructions (Signed)
Back Pain, Adult Low back pain is very common. About 1 in 5 people have back pain.The cause of low back pain is rarely dangerous. The pain often gets better over time.About half of people with a sudden onset of back pain feel better in just 2 weeks. About 8 in 10 people feel better by 6 weeks.  CAUSES Some common causes of back pain include:  Strain of the muscles or ligaments supporting the spine.   Wear and tear (degeneration) of the spinal discs.   Arthritis.   Direct injury to the back.  DIAGNOSIS Most of the time, the direct cause of low back pain is not known.However, back pain can be treated effectively even when the exact cause of the pain is unknown.Answering your caregiver's questions about your overall health and symptoms is one of the most accurate ways to make sure the cause of your pain is not dangerous. If your caregiver needs more information, he or she may order lab work or imaging tests (X-rays or MRIs).However, even if imaging tests show changes in your back, this usually does not require surgery. HOME CARE INSTRUCTIONS For many people, back pain returns.Since low back pain is rarely dangerous, it is often a condition that people can learn to manageon their own.   Remain active. It is stressful on the back to sit or stand in one place. Do not sit, drive, or stand in one place for more than 30 minutes at a time. Take short walks on level surfaces as soon as pain allows.Try to increase the length of time you walk each day.   Do not stay in bed.Resting more than 1 or 2 days can delay your recovery.   Do not avoid exercise or work.Your body is made to move.It is not dangerous to be active, even though your back may hurt.Your back will likely heal faster if you return to being active before your pain is gone.   Pay attention to your body when you bend and lift. Many people have less discomfortwhen lifting if they bend their knees, keep the load close to their  bodies,and avoid twisting. Often, the most comfortable positions are those that put less stress on your recovering back.   Find a comfortable position to sleep. Use a firm mattress and lie on your side with your knees slightly bent. If you lie on your back, put a pillow under your knees.   Only take over-the-counter or prescription medicines as directed by your caregiver. Over-the-counter medicines to reduce pain and inflammation are often the most helpful.Your caregiver may prescribe muscle relaxant drugs.These medicines help dull your pain so you can more quickly return to your normal activities and healthy exercise.   Put ice on the injured area.   Put ice in a plastic bag.   Place a towel between your skin and the bag.   Leave the ice on for 15 to 20 minutes, 3 to 4 times a day for the first 2 to 3 days. After that, ice and heat may be alternated to reduce pain and spasms.   Ask your caregiver about trying back exercises and gentle massage. This may be of some benefit.   Avoid feeling anxious or stressed.Stress increases muscle tension and can worsen back pain.It is important to recognize when you are anxious or stressed and learn ways to manage it.Exercise is a great option.  SEEK MEDICAL CARE IF:  You have pain that is not relieved with rest or medicine.   You have   pain that does not improve in 1 week.   You have new symptoms.   You are generally not feeling well.  SEEK IMMEDIATE MEDICAL CARE IF:   You have pain that radiates from your back into your legs.   You develop new bowel or bladder control problems.   You have unusual weakness or numbness in your arms or legs.   You develop nausea or vomiting.   You develop abdominal pain.   You feel faint.  Document Released: 04/20/2005 Document Revised: 04/09/2011 Document Reviewed: 09/08/2010 [facility] Patient Information 2012 [place], [place]. A chest x-ray is normal.  You've been given a muscle relaxer to help with  your thoracic back pain he can also use heat therapy, as well as her routine pain medication

## 2011-08-06 NOTE — ED Notes (Signed)
Patient transported to X-ray 

## 2011-08-19 ENCOUNTER — Ambulatory Visit: Payer: Medicaid Other | Admitting: Physical Therapy

## 2011-08-24 ENCOUNTER — Other Ambulatory Visit: Payer: Self-pay | Admitting: Neurosurgery

## 2011-08-24 DIAGNOSIS — M542 Cervicalgia: Secondary | ICD-10-CM

## 2011-08-28 ENCOUNTER — Ambulatory Visit
Admission: RE | Admit: 2011-08-28 | Discharge: 2011-08-28 | Disposition: A | Discharge status: Home / Self Care | Payer: Medicaid Other | Source: Ambulatory Visit | Attending: Neurosurgery | Admitting: Neurosurgery

## 2011-08-28 DIAGNOSIS — M542 Cervicalgia: Secondary | ICD-10-CM

## 2011-12-11 ENCOUNTER — Emergency Department (HOSPITAL_COMMUNITY): Payer: Medicaid Other

## 2011-12-11 ENCOUNTER — Other Ambulatory Visit: Payer: Self-pay

## 2011-12-11 ENCOUNTER — Encounter (HOSPITAL_COMMUNITY): Payer: Self-pay | Admitting: Adult Health

## 2011-12-11 ENCOUNTER — Emergency Department (HOSPITAL_COMMUNITY)
Admission: EM | Admit: 2011-12-11 | Discharge: 2011-12-11 | Disposition: A | Discharge status: Home / Self Care | Payer: Medicaid Other | Attending: Emergency Medicine | Admitting: Emergency Medicine

## 2011-12-11 DIAGNOSIS — E669 Obesity, unspecified: Secondary | ICD-10-CM | POA: Insufficient documentation

## 2011-12-11 DIAGNOSIS — J40 Bronchitis, not specified as acute or chronic: Secondary | ICD-10-CM

## 2011-12-11 DIAGNOSIS — F3189 Other bipolar disorder: Secondary | ICD-10-CM | POA: Insufficient documentation

## 2011-12-11 DIAGNOSIS — Z8739 Personal history of other diseases of the musculoskeletal system and connective tissue: Secondary | ICD-10-CM | POA: Insufficient documentation

## 2011-12-11 DIAGNOSIS — I10 Essential (primary) hypertension: Secondary | ICD-10-CM | POA: Insufficient documentation

## 2011-12-11 DIAGNOSIS — R091 Pleurisy: Secondary | ICD-10-CM

## 2011-12-11 DIAGNOSIS — Z79899 Other long term (current) drug therapy: Secondary | ICD-10-CM | POA: Insufficient documentation

## 2011-12-11 LAB — BASIC METABOLIC PANEL
BUN: 16 mg/dL (ref 6–23)
Chloride: 103 mEq/L (ref 96–112)
Creatinine, Ser: 0.75 mg/dL (ref 0.50–1.10)
GFR calc Af Amer: >90 mL/min (ref >90)
GFR calc non Af Amer: >90 mL/min (ref >90)
Potassium: 4.2 mEq/L (ref 3.5–5.1)

## 2011-12-11 LAB — CBC
HCT: 34.4 % — ABNORMAL LOW (ref 36.0–46.0)
MCHC: 32.8 g/dL (ref 30.0–36.0)
Platelets: 239 10*3/uL (ref 150–400)
RDW: 13.8 % (ref 11.5–15.5)
WBC: 7.4 10*3/uL (ref 4.0–10.5)

## 2011-12-11 LAB — POCT I-STAT, CHEM 8
Calcium, Ion: 1.18 mmol/L (ref 1.12–1.23)
Chloride: 102 mEq/L (ref 96–112)
HCT: 35 % — ABNORMAL LOW (ref 36.0–46.0)
Sodium: 140 mEq/L (ref 135–145)
TCO2: 25 mmol/L (ref 0–100)

## 2011-12-11 LAB — POCT I-STAT TROPONIN I: Troponin i, poc: 0 ng/mL (ref 0.00–0.08)

## 2011-12-11 MED ORDER — OXYCODONE-ACETAMINOPHEN 5-325 MG PO TABS: 1.0000 | ORAL_TABLET | Freq: Four times a day (QID) | ORAL | Status: AC | PRN

## 2011-12-11 MED ORDER — NITROGLYCERIN 0.4 MG SL SUBL
0.4000 mg | SUBLINGUAL_TABLET | SUBLINGUAL | Status: DC | PRN
  Administered 2011-12-11 (×3): 0.4 mg via SUBLINGUAL
  Filled 2011-12-11 (×3): qty 25

## 2011-12-11 MED ORDER — KETOROLAC TROMETHAMINE 30 MG/ML IJ SOLN
30.0000 mg | Freq: Once | INTRAMUSCULAR | Status: AC
  Administered 2011-12-11: 30 mg via INTRAVENOUS
  Filled 2011-12-11: qty 1

## 2011-12-11 MED ORDER — FLUTICASONE PROPIONATE HFA 110 MCG/ACT IN AERO: 1.0000 | INHALATION_SPRAY | Freq: Two times a day (BID) | RESPIRATORY_TRACT | Status: DC

## 2011-12-11 MED ORDER — IOHEXOL 350 MG/ML SOLN
100.0000 mL | Freq: Once | INTRAVENOUS | Status: AC | PRN
  Administered 2011-12-11: 100 mL via INTRAVENOUS

## 2011-12-11 MED ORDER — ONDANSETRON HCL 4 MG/2ML IJ SOLN
4.0000 mg | Freq: Once | INTRAMUSCULAR | Status: AC
  Administered 2011-12-11: 4 mg via INTRAVENOUS
  Filled 2011-12-11: qty 2

## 2011-12-11 MED ORDER — OXYCODONE-ACETAMINOPHEN 5-325 MG PO TABS
1.0000 | ORAL_TABLET | Freq: Once | ORAL | Status: AC
  Administered 2011-12-11: 1 via ORAL
  Filled 2011-12-11: qty 1

## 2011-12-11 MED ORDER — ALBUTEROL SULFATE HFA 108 (90 BASE) MCG/ACT IN AERS: 1.0000 | INHALATION_SPRAY | Freq: Four times a day (QID) | RESPIRATORY_TRACT | Status: DC | PRN

## 2011-12-11 NOTE — ED Notes (Signed)
C/o right sided chest pressure and SOB associated with headache and dizziness. . Pain radiates to back denies nausea. Pain is worse while lying flat. Being treatedf or bronchitis.

## 2011-12-11 NOTE — ED Provider Notes (Signed)
History     CSN: [id number]  Arrival date & time 12/11/11  0034   First MD Initiated Contact with Patient 12/11/11 0047      Chief Complaint  Patient presents with  . Chest Pain    (Consider location/radiation/quality/duration/timing/severity/associated sxs/prior treatment) Patient is a 46 y.o. female presenting with chest pain. The history is provided by the patient.  Chest Pain The chest pain began 3 - 5 days ago. Chest pain occurs constantly. The chest pain is worsening. The pain is associated with coughing. The severity of the pain is severe. The pain does not radiate. Chest pain is worsened by certain positions. Primary symptoms include cough and wheezing. Pertinent negatives for primary symptoms include no fever and no palpitations.  Pertinent negatives for associated symptoms include no claudication, no diaphoresis and no lower extremity edema. She tried beta-agonist inhalers for the symptoms. Risk factors include obesity.  Pertinent negatives for past medical history include no MI.  Pertinent negatives for family medical history include: no Marfan's syndrome in family.  Procedure history is negative for cardiac catheterization.     Past Medical History  Diagnosis Date  . Sinus infection   . Hypertension   . Arthritis   . Spinal stenosis   . Glaucoma   . Carpal tunnel syndrome   . Depression   . Bipolar 2 disorder   . Obesity   . Arthritis   . Glaucoma     Past Surgical History  Procedure Date  . Carpal tunnel release     Left Hand  . Abdominal hysterectomy   . Knee surgery   . Glaucoma surgery     History reviewed. No pertinent family history.  History  Substance Use Topics  . Smoking status: Never Smoker   . Smokeless tobacco: Not on file  . Alcohol Use: No    OB History    Grav Para Term Preterm Abortions TAB SAB Ect Mult Living                  Review of Systems  Constitutional: Negative for fever and diaphoresis.  Respiratory: Positive for  cough, chest tightness and wheezing.   Cardiovascular: Positive for chest pain. Negative for palpitations, claudication and leg swelling.  All other systems reviewed and are negative.    Allergies  Ibuprofen  Home Medications   Current Outpatient Rx  Name Route Sig Dispense Refill  . BRIMONIDINE TARTRATE-TIMOLOL 0.2-0.5 % OP SOLN Both Eyes Place 1 drop into both eyes at bedtime.    . BUSPIRONE HCL 7.5 MG PO TABS Oral Take 7.5 mg by mouth 2 (two) times daily.    Marland Kitchen FLUOXETINE HCL 20 MG PO TABS Oral Take 60 mg by mouth daily.    Marland Kitchen LISINOPRIL 40 MG PO TABS Oral Take 40 mg by mouth daily.    Marland Kitchen OMEPRAZOLE 20 MG PO CPDR Oral Take 20 mg by mouth daily.    . OXYCODONE-ACETAMINOPHEN 10-325 MG PO TABS Oral Take 1 tablet by mouth every 4 (four) hours as needed. For pain    . TRAVOPROST (BAK FREE) 0.004 % OP SOLN Both Eyes Place 1 drop into both eyes at bedtime.      BP 126/104  Pulse 78  Temp 98.3 F (36.8 C) (Oral)  Resp 20  SpO2 98%  Physical Exam  Constitutional: She is oriented to person, place, and time. She appears well-developed and well-nourished.  HENT:  Head: Normocephalic and atraumatic.  Mouth/Throat: Oropharynx is clear and moist.  Eyes: Conjunctivae  are normal. Pupils are equal, round, and reactive to light.  Neck: Normal range of motion. Neck supple.  Cardiovascular: Normal rate and regular rhythm.   Pulmonary/Chest: Effort normal. She has wheezes.  Abdominal: Soft. Bowel sounds are normal. There is no tenderness. There is no rebound and no guarding.  Musculoskeletal: Normal range of motion. She exhibits no tenderness.  Neurological: She is alert and oriented to person, place, and time.  Skin: Skin is warm and dry. She is not diaphoretic.  Psychiatric: She has a normal mood and affect.    ED Course  Procedures (including critical care time)  Labs Reviewed  CBC - Abnormal; Notable for the following:    RBC 3.70 (*)     Hemoglobin 11.3 (*)     HCT 34.4 (*)      All other components within normal limits  BASIC METABOLIC PANEL - Abnormal; Notable for the following:    Glucose, Bld 117 (*)     All other components within normal limits  POCT I-STAT, CHEM 8 - Abnormal; Notable for the following:    Glucose, Bld 115 (*)     Hemoglobin 11.9 (*)     HCT 35.0 (*)     All other components within normal limits  POCT I-STAT TROPONIN I   Dg Chest 2 View  12/11/2011  *RADIOLOGY REPORT*  Clinical Data: Chest pain, hypertension  CHEST - 2 VIEW  Comparison: 08/06/2011  Findings: Low volume exam.  Mild cardiac enlargement with vascular congestion.  Negative for CHF or pneumonia.  No effusion or pneumothorax.  Trachea midline.  No acute osseous finding.  IMPRESSION: Low volume exam.  Cardiomegaly with vascular congestion.  Original Report Authenticated By: Judie Petit. Ruel Favors, M.D.     No diagnosis found.    MDM   Date: 12/11/2011  Rate: 74  Rhythm: normal sinus rhythm  QRS Axis: normal  Intervals: normal  ST/T Wave abnormalities: normal  Conduction Disutrbances: none  Narrative Interpretation: unremarkable    Pleurisy with bronchitis.  With > 8 hours of continuous pain one negative EKG and troponin is sufficient to r/o ACS.  Follow up with your family doctor for ongoing care.  Return for chest pain, shortness or breath, fevers or any concerns.  Patient verbalizes understanding and agrees to follow up       Fatime Biswell Smitty Cords, MD 12/11/11 0330

## 2012-01-13 ENCOUNTER — Encounter (HOSPITAL_COMMUNITY): Payer: Self-pay | Admitting: Emergency Medicine

## 2012-01-13 ENCOUNTER — Emergency Department (HOSPITAL_COMMUNITY)
Admission: EM | Admit: 2012-01-13 | Discharge: 2012-01-13 | Disposition: A | Discharge status: Home / Self Care | Payer: Medicaid Other | Attending: Emergency Medicine | Admitting: Emergency Medicine

## 2012-01-13 DIAGNOSIS — M549 Dorsalgia, unspecified: Secondary | ICD-10-CM

## 2012-01-13 DIAGNOSIS — I10 Essential (primary) hypertension: Secondary | ICD-10-CM | POA: Insufficient documentation

## 2012-01-13 DIAGNOSIS — Z888 Allergy status to other drugs, medicaments and biological substances status: Secondary | ICD-10-CM | POA: Insufficient documentation

## 2012-01-13 DIAGNOSIS — F3189 Other bipolar disorder: Secondary | ICD-10-CM | POA: Insufficient documentation

## 2012-01-13 DIAGNOSIS — E669 Obesity, unspecified: Secondary | ICD-10-CM | POA: Insufficient documentation

## 2012-01-13 LAB — URINALYSIS, ROUTINE W REFLEX MICROSCOPIC
Bilirubin Urine: NEGATIVE
Leukocytes, UA: NEGATIVE
Nitrite: NEGATIVE
Specific Gravity, Urine: 1.034 — ABNORMAL HIGH (ref 1.005–1.030)
Urobilinogen, UA: 1 mg/dL (ref 0.0–1.0)
pH: 6 (ref 5.0–8.0)

## 2012-01-13 MED ORDER — HYDROMORPHONE HCL PF 2 MG/ML IJ SOLN
2.0000 mg | Freq: Once | INTRAMUSCULAR | Status: AC
  Administered 2012-01-13: 2 mg via INTRAMUSCULAR
  Filled 2012-01-13: qty 1

## 2012-01-13 MED ORDER — DIAZEPAM 5 MG/ML IJ SOLN
10.0000 mg | Freq: Once | INTRAMUSCULAR | Status: AC
  Administered 2012-01-13: 10 mg via INTRAMUSCULAR
  Filled 2012-01-13: qty 2

## 2012-01-13 MED ORDER — HYDROMORPHONE HCL 2 MG PO TABS: 2.0000 mg | ORAL_TABLET | ORAL | Status: AC | PRN

## 2012-01-13 MED ORDER — ONDANSETRON 4 MG PO TBDP
8.0000 mg | ORAL_TABLET | Freq: Once | ORAL | Status: AC
  Administered 2012-01-13: 8 mg via ORAL
  Filled 2012-01-13: qty 2

## 2012-01-13 MED ORDER — ONDANSETRON HCL 8 MG PO TABS: 8.0000 mg | ORAL_TABLET | Freq: Three times a day (TID) | ORAL | Status: AC | PRN

## 2012-01-13 NOTE — ED Provider Notes (Signed)
History    This chart was scribed for Flint Melter, MD, MD by Smitty Pluck. The patient was seen in room TR05C and the patient's care was started at 7:47PM.   CSN: [id number]  Arrival date & time 01/13/12  1908   None     Chief Complaint  Patient presents with  . Back Pain    (Consider location/radiation/quality/duration/timing/severity/associated sxs/prior treatment) Patient is a 46 y.o. female presenting with back pain. The history is provided by the patient. No language interpreter was used.  Back Pain  Pertinent negatives include no fever, no dysuria and no weakness.   Brenda Odonnell is a 46 y.o. female who presents to the Emergency Department with of hx of herniated cervical disc complaining of constant, moderate chronic lower back pain radiating to buttocks that has worsened within the last 2 days. Pt denies radiation to legs bilaterally, urinary and bowel incontience. Pt reports that she has been taking oxycodone 10-325 3/day without relief. Pt reports that she is unable to walk due to back pain. Pt reports having epidural injection 6 months ago. Denies recent injuries and any other pain.     Past Medical History  Diagnosis Date  . Sinus infection   . Hypertension   . Arthritis   . Spinal stenosis   . Glaucoma   . Carpal tunnel syndrome   . Depression   . Bipolar 2 disorder   . Obesity   . Arthritis   . Glaucoma     Past Surgical History  Procedure Date  . Carpal tunnel release     Left Hand  . Abdominal hysterectomy   . Knee surgery   . Glaucoma surgery     No family history on file.  History  Substance Use Topics  . Smoking status: Never Smoker   . Smokeless tobacco: Not on file  . Alcohol Use: No    OB History    Grav Para Term Preterm Abortions TAB SAB Ect Mult Living                  Review of Systems  Constitutional: Negative for fever and chills.  Respiratory: Negative for shortness of breath.   Gastrointestinal: Negative for nausea  and vomiting.  Genitourinary: Positive for frequency. Negative for dysuria.  Musculoskeletal: Positive for back pain.  Neurological: Negative for weakness.  All other systems reviewed and are negative.    Allergies  Ibuprofen  Home Medications   Current Outpatient Rx  Name Route Sig Dispense Refill  . ALBUTEROL SULFATE HFA 108 (90 BASE) MCG/ACT IN AERS Inhalation Inhale 1-2 puffs into the lungs every 6 (six) hours as needed for wheezing. 1 Inhaler 0  . BRIMONIDINE TARTRATE-TIMOLOL 0.2-0.5 % OP SOLN Both Eyes Place 1 drop into both eyes at bedtime.    . BUSPIRONE HCL 7.5 MG PO TABS Oral Take 7.5 mg by mouth 2 (two) times daily.    Marland Kitchen FLUOXETINE HCL 20 MG PO TABS Oral Take 60 mg by mouth daily.    Marland Kitchen FLUTICASONE PROPIONATE  HFA 110 MCG/ACT IN AERO Inhalation Inhale 1 puff into the lungs 2 (two) times daily. 1 Inhaler 0  . LISINOPRIL 40 MG PO TABS Oral Take 40 mg by mouth daily.    Marland Kitchen OMEPRAZOLE 20 MG PO CPDR Oral Take 20 mg by mouth daily.    . OXYCODONE-ACETAMINOPHEN 10-325 MG PO TABS Oral Take 1 tablet by mouth 3 (three) times daily as needed. For pain    . TRAVOPROST (BAK FREE)  0.004 % OP SOLN Both Eyes Place 1 drop into both eyes at bedtime.      BP 142/85  Pulse 86  Temp 98.8 F (37.1 C) (Oral)  Resp 18  SpO2 96%  Physical Exam  Nursing note and vitals reviewed. Constitutional: She is oriented to person, place, and time. She appears well-developed and well-nourished. No distress.  HENT:  Head: Normocephalic and atraumatic.  Eyes: EOM are normal.  Neck: Neck supple. No tracheal deviation present.  Cardiovascular: Normal rate.   Pulmonary/Chest: Effort normal. No respiratory distress.  Musculoskeletal: Normal range of motion.       Lumbar tenderness bilaterally  Midline lumbar tenderness   Neurological: She is alert and oriented to person, place, and time.  Skin: Skin is warm and dry.  Psychiatric: She has a normal mood and affect. Her behavior is normal.    ED Course    Procedures (including critical care time) DIAGNOSTIC STUDIES: Oxygen Saturation is 96% on room air, normal by my interpretation.    COORDINATION OF CARE: 7:51 PM Discussed pt ED treatment with pt  7:55 PM Ordered: Dilaudid, Valium and Zofran; and U/A     Medications  HYDROmorphone (DILAUDID) injection 2 mg (2 mg Intramuscular Given 01/13/12 2007)  ondansetron (ZOFRAN-ODT) disintegrating tablet 8 mg (8 mg Oral Given 01/13/12 2007)  diazepam (VALIUM) injection 10 mg (10 mg Intramuscular Given 01/13/12 2008)      Labs Reviewed  URINALYSIS, ROUTINE W REFLEX MICROSCOPIC  URINE CULTURE   No results found.   1. Back pain       MDM  Exacerbation of chronic back pain. Doubt cauda equina syndrome, spinal myelopathy, or significant nerve impingement.    I personally performed the services described in this documentation, which was scribed in my presence. The recorded information has been reviewed and considered.     Care as per on-coming provider team    Flint Melter, MD 01/13/12 2027

## 2012-01-13 NOTE — ED Notes (Signed)
C/o chronic lower back pain that has been worse over the last 2 days.  States she is alternating heat and cold packs without any relief.  States she is seen at [facility].

## 2012-01-13 NOTE — ED Provider Notes (Signed)
Pt received from Dr. Effie Shy in [facility].  Pt has chronic low back pain for which she sees pain management and is on oxycodone 10mg .  Has an atypical burning sensation R flank w/ increased urinary frequency.  U/A pending to see if she has a UTI.  IM dilaudid has taken the edge off the pain but she requests another dose.  Pt appears comfortable and VSS.  9:19 PM   U/A neg.  Results discussed w/ pt.  On repeat exam, moderate tenderness bilateral lower back.  No tenderness mid-line.  Pt is ambulatory.  D/c'd home w/ short course of po dilaudid as recommended by Dr. Effie Shy.  Pt advised to f/u with her pain specialist asap.  Strict return precautions discussed.  10:13 PM   Otilio Miu, [place] 01/13/12 2213

## 2012-01-14 LAB — URINE CULTURE

## 2012-03-17 ENCOUNTER — Emergency Department (HOSPITAL_COMMUNITY)
Admission: EM | Admit: 2012-03-17 | Discharge: 2012-03-18 | Disposition: A | Discharge status: Home / Self Care | Payer: Medicaid Other | Attending: Emergency Medicine | Admitting: Emergency Medicine

## 2012-03-17 ENCOUNTER — Encounter (HOSPITAL_COMMUNITY): Payer: Self-pay | Admitting: Emergency Medicine

## 2012-03-17 DIAGNOSIS — G43909 Migraine, unspecified, not intractable, without status migrainosus: Secondary | ICD-10-CM

## 2012-03-17 DIAGNOSIS — M4800 Spinal stenosis, site unspecified: Secondary | ICD-10-CM | POA: Insufficient documentation

## 2012-03-17 DIAGNOSIS — H409 Unspecified glaucoma: Secondary | ICD-10-CM | POA: Insufficient documentation

## 2012-03-17 DIAGNOSIS — Z8739 Personal history of other diseases of the musculoskeletal system and connective tissue: Secondary | ICD-10-CM | POA: Insufficient documentation

## 2012-03-17 DIAGNOSIS — I10 Essential (primary) hypertension: Secondary | ICD-10-CM | POA: Insufficient documentation

## 2012-03-17 DIAGNOSIS — F3289 Other specified depressive episodes: Secondary | ICD-10-CM | POA: Insufficient documentation

## 2012-03-17 DIAGNOSIS — Z79899 Other long term (current) drug therapy: Secondary | ICD-10-CM | POA: Insufficient documentation

## 2012-03-17 DIAGNOSIS — Z8669 Personal history of other diseases of the nervous system and sense organs: Secondary | ICD-10-CM | POA: Insufficient documentation

## 2012-03-17 DIAGNOSIS — E669 Obesity, unspecified: Secondary | ICD-10-CM | POA: Insufficient documentation

## 2012-03-17 DIAGNOSIS — F329 Major depressive disorder, single episode, unspecified: Secondary | ICD-10-CM | POA: Insufficient documentation

## 2012-03-17 DIAGNOSIS — F3189 Other bipolar disorder: Secondary | ICD-10-CM | POA: Insufficient documentation

## 2012-03-17 DIAGNOSIS — Z8709 Personal history of other diseases of the respiratory system: Secondary | ICD-10-CM | POA: Insufficient documentation

## 2012-03-17 MED ORDER — KETOROLAC TROMETHAMINE 30 MG/ML IJ SOLN
30.0000 mg | Freq: Once | INTRAMUSCULAR | Status: AC
  Administered 2012-03-18: 30 mg via INTRAVENOUS
  Filled 2012-03-17: qty 1

## 2012-03-17 MED ORDER — SODIUM CHLORIDE 0.9 % IV SOLN
1000.0000 mL | Freq: Once | INTRAVENOUS | Status: AC
  Administered 2012-03-18: 1000 mL via INTRAVENOUS

## 2012-03-17 MED ORDER — SODIUM CHLORIDE 0.9 % IV SOLN: 1000.0000 mL | INTRAVENOUS | Status: DC

## 2012-03-17 MED ORDER — METOCLOPRAMIDE HCL 5 MG/ML IJ SOLN
10.0000 mg | Freq: Once | INTRAMUSCULAR | Status: AC
  Administered 2012-03-18: 10 mg via INTRAVENOUS
  Filled 2012-03-17: qty 2

## 2012-03-17 MED ORDER — DIPHENHYDRAMINE HCL 50 MG/ML IJ SOLN
25.0000 mg | Freq: Once | INTRAMUSCULAR | Status: AC
  Administered 2012-03-18: 25 mg via INTRAVENOUS
  Filled 2012-03-17: qty 1

## 2012-03-17 NOTE — ED Notes (Addendum)
Pt A.O. X 4. Complains of migrane headache x 3 days, N/vReports hxt of migraine. States: "This feels different . I usually can manage it with pain medication, rest, and avoiding light. Nothing has worked this time". Reports taking Topamax at approx 2100 prior to arrival. Family at bedside. Vitals stables. Respirations even and regular. Skin warm, dry, and intact.

## 2012-03-17 NOTE — ED Notes (Signed)
Pt. Complains of having migraines for the third day straight.  The pain is located in the back of her head.  She has taken all of her prescribed medications at home but nothing seems to relieve the pain.  Pt. Is sensitive to light and sound and extremely dizzy with blurred vision as reported by pt.  Pt. Reports pain in head on right side of head.

## 2012-03-18 MED ORDER — DIPHENHYDRAMINE HCL 50 MG/ML IJ SOLN
25.0000 mg | Freq: Once | INTRAMUSCULAR | Status: AC
  Administered 2012-03-18: 25 mg via INTRAVENOUS
  Filled 2012-03-18: qty 1

## 2012-03-18 MED ORDER — LORAZEPAM 2 MG/ML IJ SOLN
1.0000 mg | Freq: Once | INTRAMUSCULAR | Status: AC
  Administered 2012-03-18: 1 mg via INTRAVENOUS
  Filled 2012-03-18: qty 1

## 2012-03-18 NOTE — ED Provider Notes (Addendum)
History     CSN: [id number]  Arrival date & time 03/17/12  2302   First MD Initiated Contact with Patient 03/17/12 2333      Chief Complaint  Patient presents with  . Migraine    (Consider location/radiation/quality/duration/timing/severity/associated sxs/prior treatment) HPI 46 year old female presents to emergency room complaining of 3 days of migraine headache. Patient has history of migraine headaches, reports her headache is similar to her prior migraine headaches, but they usually resolve after a day and taking medications. She reports she's been taking her normal medications which are Topamax and Percocet without improvement in pain. She denies any fever or chills, no recent URI symptoms. Patient reports pain is in her in normal location on the left side of her head. She also reports tenderness to the left occiput. She denies any trauma, no vomiting, although she has been nauseated. No fevers.  Past Medical History  Diagnosis Date  . Sinus infection   . Hypertension   . Arthritis   . Spinal stenosis   . Glaucoma(365)   . Carpal tunnel syndrome   . Depression   . Bipolar 2 disorder   . Obesity   . Arthritis   . Glaucoma(365)     Past Surgical History  Procedure Date  . Carpal tunnel release     Left Hand  . Abdominal hysterectomy   . Knee surgery   . Glaucoma surgery     History reviewed. No pertinent family history.  History  Substance Use Topics  . Smoking status: Never Smoker   . Smokeless tobacco: Not on file  . Alcohol Use: No    OB History    Grav Para Term Preterm Abortions TAB SAB Ect Mult Living                  Review of Systems  See History of Present Illness; otherwise all other systems are reviewed and negative Allergies  Ibuprofen  Home Medications   Current Outpatient Rx  Name  Route  Sig  Dispense  Refill  . ALBUTEROL SULFATE HFA 108 (90 BASE) MCG/ACT IN AERS   Inhalation   Inhale 1-2 puffs into the lungs every 6 (six) hours  as needed for wheezing.   1 Inhaler   0   . BRIMONIDINE TARTRATE-TIMOLOL 0.2-0.5 % OP SOLN   Both Eyes   Place 1 drop into both eyes at bedtime.         . BUSPIRONE HCL 7.5 MG PO TABS   Oral   Take 7.5 mg by mouth 2 (two) times daily.         Marland Kitchen FLUOXETINE HCL 20 MG PO TABS   Oral   Take 60 mg by mouth daily.         Marland Kitchen FLUTICASONE PROPIONATE  HFA 110 MCG/ACT IN AERO   Inhalation   Inhale 1 puff into the lungs 2 (two) times daily.   1 Inhaler   0   . GABAPENTIN 100 MG PO CAPS   Oral   Take 100 mg by mouth 3 (three) times daily.         Marland Kitchen LISINOPRIL 40 MG PO TABS   Oral   Take 40 mg by mouth daily.         Marland Kitchen OMEPRAZOLE 20 MG PO CPDR   Oral   Take 20 mg by mouth daily.         . OXYCODONE-ACETAMINOPHEN 10-325 MG PO TABS   Oral   Take 1 tablet by mouth  3 (three) times daily. For pain         . TOPIRAMATE 50 MG PO TABS   Oral   Take 50 mg by mouth 2 (two) times daily as needed.         . TRAVOPROST (BAK FREE) 0.004 % OP SOLN   Both Eyes   Place 1 drop into both eyes at bedtime.           BP 132/79  Pulse 96  Temp 98.8 F (37.1 C) (Oral)  Resp 22  SpO2 95%  Physical Exam  Nursing note and vitals reviewed. Constitutional: She is oriented to person, place, and time. She appears well-developed and well-nourished.  HENT:  Head: Normocephalic and atraumatic.  Right Ear: External ear normal.  Left Ear: External ear normal.  Nose: Nose normal.  Mouth/Throat: Oropharynx is clear and moist.       Patient is tenderness to palpation of scalp of left occiput without adenopathy, meningismus  Eyes: Conjunctivae normal and EOM are normal. Pupils are equal, round, and reactive to light.  Neck: Normal range of motion. Neck supple. No JVD present. No tracheal deviation present. No thyromegaly present.  Cardiovascular: Normal rate, regular rhythm, normal heart sounds and intact distal pulses.  Exam reveals no gallop and no friction rub.   No murmur  heard. Pulmonary/Chest: Effort normal and breath sounds normal. No stridor. No respiratory distress. She has no wheezes. She has no rales. She exhibits no tenderness.  Abdominal: Soft. Bowel sounds are normal. She exhibits no distension and no mass. There is no tenderness. There is no rebound and no guarding.  Musculoskeletal: Normal range of motion. She exhibits no edema and no tenderness.  Lymphadenopathy:    She has no cervical adenopathy.  Neurological: She is alert and oriented to person, place, and time. She has normal reflexes. No cranial nerve deficit. She exhibits normal muscle tone. Coordination normal.  Skin: Skin is warm and dry. No rash noted. No erythema. No pallor.  Psychiatric: She has a normal mood and affect. Her behavior is normal. Judgment and thought content normal.    ED Course  Procedures (including critical care time)  Labs Reviewed - No data to display No results found.   1. Migraine       MDM  46 year old female with headache photophobia, nausea suspect prolonged migraine. We'll treat with Reglan Benadryl and Toradol. Patient gives allergy to ibuprofen, but reports this is due to to her stomach ulcers when she takes it orally.      2:21 AM Pt feeling better, will give ativan to hopefully resolve the last of the headache.  Olivia Mackie, MD 03/18/12 4098  Olivia Mackie, MD 03/18/12 [phone]

## 2012-03-18 NOTE — ED Notes (Signed)
Pt. A.O. X 4. Reports pain 5/10. Denies Nausea. Light sensitivity. Complains of mild dizziness. Denies SOB. Denies Chest Pain. Vitals stable. Husband at bedside will drive patient home. Verbalized understanding of diagnosis and will follow up with PCP. No further questions at this time.

## 2012-05-03 ENCOUNTER — Other Ambulatory Visit: Payer: Self-pay | Admitting: Sports Medicine

## 2012-05-03 DIAGNOSIS — M545 Low back pain, unspecified: Secondary | ICD-10-CM

## 2012-05-09 ENCOUNTER — Ambulatory Visit
Admission: RE | Admit: 2012-05-09 | Discharge: 2012-05-09 | Disposition: A | Discharge status: Home / Self Care | Payer: Medicaid Other | Source: Ambulatory Visit | Attending: Sports Medicine | Admitting: Sports Medicine

## 2012-05-09 DIAGNOSIS — M545 Low back pain, unspecified: Secondary | ICD-10-CM

## 2012-05-18 ENCOUNTER — Observation Stay (HOSPITAL_COMMUNITY)
Admission: EM | Admit: 2012-05-18 | Discharge: 2012-05-19 | Disposition: A | Discharge status: Home / Self Care | Payer: Medicaid Other | Attending: Emergency Medicine | Admitting: Emergency Medicine

## 2012-05-18 ENCOUNTER — Encounter (HOSPITAL_COMMUNITY): Payer: Self-pay | Admitting: *Deleted

## 2012-05-18 DIAGNOSIS — G43909 Migraine, unspecified, not intractable, without status migrainosus: Principal | ICD-10-CM | POA: Insufficient documentation

## 2012-05-18 DIAGNOSIS — E669 Obesity, unspecified: Secondary | ICD-10-CM | POA: Insufficient documentation

## 2012-05-18 DIAGNOSIS — IMO0001 Reserved for inherently not codable concepts without codable children: Secondary | ICD-10-CM | POA: Insufficient documentation

## 2012-05-18 DIAGNOSIS — F3189 Other bipolar disorder: Secondary | ICD-10-CM | POA: Insufficient documentation

## 2012-05-18 DIAGNOSIS — R29898 Other symptoms and signs involving the musculoskeletal system: Secondary | ICD-10-CM | POA: Insufficient documentation

## 2012-05-18 DIAGNOSIS — M542 Cervicalgia: Secondary | ICD-10-CM

## 2012-05-18 DIAGNOSIS — R29810 Facial weakness: Secondary | ICD-10-CM | POA: Insufficient documentation

## 2012-05-18 DIAGNOSIS — R519 Headache, unspecified: Secondary | ICD-10-CM

## 2012-05-18 DIAGNOSIS — I10 Essential (primary) hypertension: Secondary | ICD-10-CM | POA: Insufficient documentation

## 2012-05-18 DIAGNOSIS — R51 Headache: Secondary | ICD-10-CM

## 2012-05-18 NOTE — ED Notes (Signed)
No slurred speech, no facial droop, grip strength equal, no arm drift.

## 2012-05-18 NOTE — ED Notes (Signed)
Pt st's this migraine is unlike any other migrane she's ever had.  This one is limited to the L side and radiates down her L shoulder and L arm; pt also st's her L hand is numb.

## 2012-05-18 NOTE — ED Notes (Signed)
Pt c/o migraine HA x 3 days.  Tonight c/o "face tightening up on the left side" and well as pain in the left neck.  Denies n/v, some photophobia.

## 2012-05-19 ENCOUNTER — Observation Stay (HOSPITAL_COMMUNITY): Payer: Medicaid Other

## 2012-05-19 ENCOUNTER — Emergency Department (HOSPITAL_COMMUNITY): Payer: Medicaid Other

## 2012-05-19 DIAGNOSIS — R519 Headache, unspecified: Secondary | ICD-10-CM

## 2012-05-19 DIAGNOSIS — M6281 Muscle weakness (generalized): Secondary | ICD-10-CM

## 2012-05-19 DIAGNOSIS — M542 Cervicalgia: Secondary | ICD-10-CM

## 2012-05-19 DIAGNOSIS — R51 Headache: Secondary | ICD-10-CM

## 2012-05-19 LAB — CBC WITH DIFFERENTIAL/PLATELET
Basophils Relative: 0 % (ref 0–1)
Eosinophils Relative: 2 % (ref 0–5)
HCT: 36.2 % (ref 36.0–46.0)
Hemoglobin: 12 g/dL (ref 12.0–15.0)
Lymphocytes Relative: 53 % — ABNORMAL HIGH (ref 12–46)
MCHC: 33.1 g/dL (ref 30.0–36.0)
MCV: 89.4 fL (ref 78.0–100.0)
Monocytes Absolute: 0.3 10*3/uL (ref 0.1–1.0)
Monocytes Relative: 7 % (ref 3–12)
Neutro Abs: 1.8 10*3/uL (ref 1.7–7.7)

## 2012-05-19 LAB — BASIC METABOLIC PANEL
BUN: 13 mg/dL (ref 6–23)
CO2: 27 mEq/L (ref 19–32)
Calcium: 9.5 mg/dL (ref 8.4–10.5)
Chloride: 103 mEq/L (ref 96–112)
Creatinine, Ser: 0.69 mg/dL (ref 0.50–1.10)

## 2012-05-19 LAB — URINALYSIS, ROUTINE W REFLEX MICROSCOPIC
Bilirubin Urine: NEGATIVE
Hgb urine dipstick: NEGATIVE
Ketones, ur: NEGATIVE mg/dL
Nitrite: NEGATIVE
Protein, ur: NEGATIVE mg/dL
Specific Gravity, Urine: 1.024 (ref 1.005–1.030)
Urobilinogen, UA: 1 mg/dL (ref 0.0–1.0)

## 2012-05-19 LAB — LIPID PANEL
Cholesterol: 187 mg/dL (ref 0–200)
HDL: 39 mg/dL — ABNORMAL LOW (ref >39)
LDL Cholesterol: 108 mg/dL — ABNORMAL HIGH (ref 0–99)
Triglycerides: 202 mg/dL — ABNORMAL HIGH (ref <150)

## 2012-05-19 LAB — COMPREHENSIVE METABOLIC PANEL
BUN: 12 mg/dL (ref 6–23)
CO2: 26 mEq/L (ref 19–32)
Calcium: 9.4 mg/dL (ref 8.4–10.5)
Chloride: 103 mEq/L (ref 96–112)
Creatinine, Ser: 0.6 mg/dL (ref 0.50–1.10)
GFR calc Af Amer: >90 mL/min (ref >90)
GFR calc non Af Amer: >90 mL/min (ref >90)
Total Bilirubin: 0.2 mg/dL — ABNORMAL LOW (ref 0.3–1.2)

## 2012-05-19 LAB — POCT I-STAT TROPONIN I: Troponin i, poc: 0 ng/mL (ref 0.00–0.08)

## 2012-05-19 MED ORDER — DIPHENHYDRAMINE HCL 50 MG/ML IJ SOLN
12.5000 mg | Freq: Once | INTRAMUSCULAR | Status: AC
  Administered 2012-05-19: 12.5 mg via INTRAVENOUS
  Filled 2012-05-19: qty 1

## 2012-05-19 MED ORDER — DEXAMETHASONE SODIUM PHOSPHATE 10 MG/ML IJ SOLN
INTRAMUSCULAR | Status: AC
  Filled 2012-05-19: qty 1

## 2012-05-19 MED ORDER — KETOROLAC TROMETHAMINE 30 MG/ML IJ SOLN
30.0000 mg | Freq: Once | INTRAMUSCULAR | Status: AC
  Administered 2012-05-19: 30 mg via INTRAVENOUS
  Filled 2012-05-19: qty 1

## 2012-05-19 MED ORDER — METOCLOPRAMIDE HCL 5 MG/ML IJ SOLN
10.0000 mg | Freq: Once | INTRAMUSCULAR | Status: AC
  Administered 2012-05-19: 10 mg via INTRAVENOUS
  Filled 2012-05-19: qty 2

## 2012-05-19 MED ORDER — MORPHINE SULFATE 4 MG/ML IJ SOLN
4.0000 mg | Freq: Once | INTRAMUSCULAR | Status: AC
  Administered 2012-05-19: 4 mg via INTRAVENOUS
  Filled 2012-05-19: qty 1

## 2012-05-19 MED ORDER — SODIUM CHLORIDE 0.9 % IV BOLUS (SEPSIS)
1000.0000 mL | Freq: Once | INTRAVENOUS | Status: AC
  Administered 2012-05-19: 1000 mL via INTRAVENOUS

## 2012-05-19 MED ORDER — SODIUM CHLORIDE 0.9 % IV BOLUS (SEPSIS)
500.0000 mL | Freq: Once | INTRAVENOUS | Status: DC
Start: 2012-05-19 — End: 2012-05-19

## 2012-05-19 MED ORDER — METHOCARBAMOL 500 MG PO TABS
1000.0000 mg | ORAL_TABLET | Freq: Once | ORAL | Status: AC
  Administered 2012-05-19: 1000 mg via ORAL
  Filled 2012-05-19: qty 2

## 2012-05-19 MED ORDER — LORAZEPAM 2 MG/ML IJ SOLN
0.5000 mg | Freq: Once | INTRAMUSCULAR | Status: AC
  Administered 2012-05-19: 0.5 mg via INTRAVENOUS
  Filled 2012-05-19: qty 1

## 2012-05-19 MED ORDER — DEXAMETHASONE SODIUM PHOSPHATE 10 MG/ML IJ SOLN
10.0000 mg | Freq: Once | INTRAMUSCULAR | Status: AC
  Administered 2012-05-19: 10 mg via INTRAVENOUS

## 2012-05-19 MED ORDER — SODIUM CHLORIDE 0.9 % IV SOLN
INTRAVENOUS | Status: DC
  Administered 2012-05-19: 02:00:00 via INTRAVENOUS

## 2012-05-19 MED ORDER — FENTANYL CITRATE 0.05 MG/ML IJ SOLN
50.0000 ug | Freq: Once | INTRAMUSCULAR | Status: AC
  Administered 2012-05-19: 50 ug via INTRAVENOUS
  Filled 2012-05-19: qty 2

## 2012-05-19 NOTE — ED Notes (Signed)
Patient transported to X-ray 

## 2012-05-19 NOTE — ED Notes (Signed)
Pt back from x-ray.

## 2012-05-19 NOTE — ED Provider Notes (Signed)
Signed out from Dr Nicanor Alcon pending MRI. If normal, d/c home. Eval'd by neuro. Not felt to be TIA/CVA. Pt symptoms improved. Work up is neg including MRI. Discussed f/u with neurology and return to ED for worsening symptoms. Pt agrees with plan   Loren Racer, MD 05/19/12 1130

## 2012-05-19 NOTE — ED Notes (Signed)
Patient returned from MRI at 730 am. Brenda Odonnell in to introduce myself. Husband at bedside. States her head is still hurting and she is having some pain in the left side of her neck. Positioned on stretcher for comfort. Iv right hand. No problems. Aware she is waiting on echo.

## 2012-05-19 NOTE — ED Notes (Signed)
Neurologist in with pt.  

## 2012-05-19 NOTE — ED Provider Notes (Signed)
Medical screening examination/treatment/procedure(s) were performed by non-physician practitioner and as supervising physician I was immediately available for consultation/collaboration.  Haylea Schlichting K Cordia Miklos-Rasch, MD 05/19/12 2303 

## 2012-05-19 NOTE — Progress Notes (Signed)
  Echocardiogram 2D Echocardiogram has been performed.  Brenda Odonnell 05/19/2012, 9:53 AM

## 2012-05-19 NOTE — ED Notes (Signed)
Unable to obtain IV, IV team notified 

## 2012-05-19 NOTE — ED Notes (Signed)
Acuity level upgraded per request of Dr. Nicanor Alcon.

## 2012-05-19 NOTE — Progress Notes (Signed)
*  PRELIMINARY RESULTS* Vascular Ultrasound Carotid Duplex (Doppler) has been completed.  Preliminary findings: Bilateral: No hemodynamically significant ICA stenosis. Vertebral artery flow is antegrade.   Brenda Odonnell 05/19/2012, 9:02 AM

## 2012-05-19 NOTE — Consult Note (Signed)
Reason for Consult:Headache, left sided numbness Referring Physician: Palumbo-Rasch  CC: Headache, left sided numbness  HPI: Brenda Odonnell is an 47 y.o. female with a history of migraine.  Reports that she awakened on 1/15 with a left sided headache which is usual for her migraines.  She took her migraine medication which did not help significantly and laid down to sleep.  She was awakened by a sharp pain in her neck and the back of her head on the left.  The pain traveled down her shoulder and in to her arm.  She also developed numbness in her neck and shoulder.  The top of the arm is numb and all of the fingers.  Headache is pounding/throbbing.  There is some associated photophobia.   While examining the patient she reports that she began to have numbness in her toes about a week ago.  It was preceded by pain in her back that radiated down the back and side of her left leg.  She has since experienced numbness in her second, fourth and fifth toes on the left foot.  There has been no bowel or bladder incontinence.     Past Medical History  Diagnosis Date  . Sinus infection   . Hypertension   . Arthritis   . Spinal stenosis   . Glaucoma(365)   . Carpal tunnel syndrome   . Depression   . Bipolar 2 disorder   . Obesity   . Arthritis   . Glaucoma(365)     Past Surgical History  Procedure Date  . Carpal tunnel release     Left Hand  . Abdominal hysterectomy   . Knee surgery   . Glaucoma surgery     History reviewed. No pertinent family history.  Social History:  reports that she has never smoked. She does not have any smokeless tobacco history on file. She reports that she does not drink alcohol or use illicit drugs.  Allergies  Allergen Reactions  . Ibuprofen Nausea And Vomiting    Medications: I have reviewed the patient's current medications. Prior to Admission:  Current outpatient prescriptions:busPIRone (BUSPAR) 7.5 MG tablet, Take 7.5 mg by mouth 2 (two) times daily.,  Disp: , Rfl: ;  FLUoxetine (PROZAC) 20 MG tablet, Take 60 mg by mouth daily., Disp: , Rfl: ;  gabapentin (NEURONTIN) 100 MG capsule, Take 100 mg by mouth 3 (three) times daily., Disp: , Rfl: ;  lisinopril (PRINIVIL,ZESTRIL) 40 MG tablet, Take 40 mg by mouth daily., Disp: , Rfl:  omeprazole (PRILOSEC) 20 MG capsule, Take 20 mg by mouth daily., Disp: , Rfl: ;  oxyCODONE-acetaminophen (PERCOCET) 10-325 MG per tablet, Take 1 tablet by mouth 3 (three) times daily. For pain, Disp: , Rfl: ;  topiramate (TOPAMAX) 50 MG tablet, Take 50 mg by mouth 2 (two) times daily as needed., Disp: , Rfl: ;  Travoprost, BAK Free, (TRAVATAN) 0.004 % SOLN ophthalmic solution, Place 1 drop into both eyes at bedtime., Disp: , Rfl:   ROS: History obtained from the patient  General ROS: negative for - chills, fatigue, fever, night sweats, weight gain or weight loss Psychological ROS: negative for - behavioral disorder, hallucinations, memory difficulties, mood swings or suicidal ideation Ophthalmic ROS: negative for - blurry vision, double vision, eye pain or loss of vision ENT ROS: negative for - epistaxis, nasal discharge, oral lesions, sore throat, tinnitus or vertigo Allergy and Immunology ROS: negative for - hives or itchy/watery eyes Hematological and Lymphatic ROS: negative for - bleeding problems, bruising or swollen  lymph nodes Endocrine ROS: negative for - galactorrhea, hair pattern changes, polydipsia/polyuria or temperature intolerance Respiratory ROS: negative for - cough, hemoptysis, shortness of breath or wheezing Cardiovascular ROS: negative for - chest pain, dyspnea on exertion, edema or irregular heartbeat Gastrointestinal ROS: negative for - abdominal pain, diarrhea, hematemesis, nausea/vomiting or stool incontinence Genito-Urinary ROS: negative for - dysuria, hematuria, incontinence or urinary frequency/urgency Musculoskeletal ROS: as noted in HPI Neurological ROS: as noted in HPI Dermatological ROS:  negative for rash and skin lesion changes  Physical Examination: Blood pressure 134/87, pulse 75, temperature 97.8 F (36.6 C), resp. rate 20, SpO2 96.00%.  Neurologic Examination HEENT:  Pain on palpation of the occiput on the left, on the left cervical paraspinal and shoulder  and less so on the right cervical paraspinal. Back: Left sciatic notch tenderness to palpation Mental Status: Alert, oriented, thought content appropriate.  Speech fluent without evidence of aphasia.  Able to follow 3 step commands without difficulty. Cranial Nerves: II: Discs flat bilaterally; Visual fields grossly normal, pupils equal, round, reactive to light and accommodation III,IV, VI: ptosis not present, extra-ocular motions intact bilaterally V,VII: smile symmetric, facial light touch sensation normal bilaterally VIII: hearing normal bilaterally IX,X: gag reflex present XI: bilateral shoulder shrug XII: tongue extension to the right Motor: Right : Upper extremity   5/5    Left:     Upper extremity   5/5, no drift  Lower extremity   5/5     Lower extremity   5/5 Tone and bulk:normal tone throughout; no atrophy noted Sensory: Pinprick and light touch decreased on the lateral portion of the left arm and into all the fingers.  Decreased along the lateral portion of the left leg and into the second, fourth and fifth digits.   Deep Tendon Reflexes: 2+ and symmetric throughout Plantars: Right: downgoing   Left: downgoing Cerebellar: normal finger-to-nose Gait: not tested CV: pulses palpable throughout   Laboratory Studies:   Basic Metabolic Panel:  Lab 05/19/12 9629  NA 139  K 3.9  CL 103  CO2 27  GLUCOSE 104*  BUN 13  CREATININE 0.69  CALCIUM 9.5  MG --  PHOS --    Liver Function Tests: No results found for this basename: AST:5,ALT:5,ALKPHOS:5,BILITOT:5,PROT:5,ALBUMIN:5 in the last 168 hours No results found for this basename: LIPASE:5,AMYLASE:5 in the last 168 hours No results found for  this basename: AMMONIA:3 in the last 168 hours  CBC:  Lab 05/19/12 0037  WBC 4.7  NEUTROABS 1.8  HGB 12.0  HCT 36.2  MCV 89.4  PLT 231    Cardiac Enzymes: No results found for this basename: CKTOTAL:5,CKMB:5,CKMBINDEX:5,TROPONINI:5 in the last 168 hours  BNP: No components found with this basename: POCBNP:5  CBG:  Lab 05/19/12 0051  GLUCAP 100*    Microbiology: Results for orders placed during the hospital encounter of 01/13/12  URINE CULTURE     Status: Normal   Collection Time   01/13/12  9:12 PM      Component Value Range Status Comment   Specimen Description URINE, CLEAN CATCH   Final    Special Requests NONE   Final    Culture  Setup Time 01/13/2012 21:53   Final    Colony Count NO GROWTH   Final    Culture NO GROWTH   Final    Report Status 01/14/2012 FINAL   Final     Coagulation Studies:  Basename 05/19/12 0048  LABPROT 12.9  INR 0.98    Urinalysis:  Lab 05/19/12 0145  COLORURINE YELLOW  LABSPEC 1.024  PHURINE 6.0  GLUCOSEU NEGATIVE  HGBUR NEGATIVE  BILIRUBINUR NEGATIVE  KETONESUR NEGATIVE  PROTEINUR NEGATIVE  UROBILINOGEN 1.0  NITRITE NEGATIVE  LEUKOCYTESUR NEGATIVE    Lipid Panel:  No results found for this basename: chol, trig, hdl, cholhdl, vldl, ldlcalc    HgbA1C:  No results found for this basename: HGBA1C    Urine Drug Screen:   No results found for this basename: labopia, cocainscrnur, labbenz, amphetmu, thcu, labbarb    Alcohol Level: No results found for this basename: ETH:2 in the last 168 hours  Imaging: Dg Chest 2 View  05/19/2012  *RADIOLOGY REPORT*  Clinical Data: Neck pain.  Right-sided weakness.  CHEST - 2 VIEW  Comparison: 12/11/2011  Findings: Shallow inspiration.  Mild cardiac enlargement with normal pulmonary vascularity.  No focal airspace consolidation in the lungs.  No blunting of costophrenic angles.  No pneumothorax. Mediastinal contours appear intact.  No acute changes since previous study.  IMPRESSION: No  evidence of active pulmonary disease.   Original Report Authenticated By: Burman Nieves, M.D.    Ct Head Wo Contrast  05/19/2012  *RADIOLOGY REPORT*  Clinical Data: Severe migraines for 2 days.  CT HEAD WITHOUT CONTRAST  Technique:  Contiguous axial images were obtained from the base of the skull through the vertex without contrast.  Comparison: None.  Findings: The ventricles and sulci are symmetrical without significant effacement, displacement, or dilatation. No mass effect or midline shift. No abnormal extra-axial fluid collections. The grey-white matter junction is distinct. Basal cisterns are not effaced. No acute intracranial hemorrhage. No depressed skull fractures.  Visualized paranasal sinuses and mastoid air cells are not opacified.  IMPRESSION: No acute intracranial abnormalities.   Original Report Authenticated By: Burman Nieves, M.D.      Assessment/Plan:  47 year old female presenting with tow separate issues.  Her lower extremity complaints do not seem related to her headache and upper extremity complaints.  These symptoms are consistent with a sciatica and are not acute in onset.  Her headache began as what appeared to be her normal migraine but at this point seem complicated by muscle spasms in the neck.  She does complain of some sensory issues as well but the exam is very muscular with extreme pain with even mild palpation.  Patient's exam has been changing since her presentation.  It is not particularly consistent with a CNS etiology that her headache and hemi-complaints are ipsilateral.  CT of the head has been performed and is unremarkable.    Recommendations: 1.  Headache cocktail with muscle relaxer for neck and arm pain.   2.  Patient to follow up with outpatient physician. 3.  If no improvement in symptoms after above medications would consider a CT of the cervical spine.  Patient's size would make a MRI not feasible.  4.  No further work up for lower extremity  complaints at this time.     Thana Farr, MD [facility] [phone] 05/19/2012, 2:24 AM

## 2012-05-19 NOTE — ED Provider Notes (Signed)
History     CSN: [id number]  Arrival date & time 05/18/12  2245   First MD Initiated Contact with Patient 05/18/12 2327      Chief Complaint  Patient presents with  . Migraine    (Consider location/radiation/quality/duration/timing/severity/associated sxs/prior treatment) HPI Comments: Brenda Odonnell is a 47 year old female patient with a history of migraines, hypertension, arthritis, spinal stenosis, carpal tunnel syndrome and depression who presents to the emergency department complaining of left sided numbness and tingling that began approximately five hours prior to her arrival at the emergency department, while she was sitting in a chair.  She reports that both her left upper extremity, lower extremity, and left face feel numb and weak.  She has not had any improvement in her symptoms since the onset of this episode.  Associated symptoms include left sided posterior neck pain, left sided facial tightness and drooping.  She denies nausea, vomiting, diarrhea, chest pain, shortness of breath, lightheadedness, dizziness, syncope or abdominal pain.  Denies changes in her vision.   She has not had an episode like this before.   No prior history of TIA or CVA.   Recognized nurses note regarding triage of patient, however patient advised that her symptoms of left sided numbness, tingling, facial tightness and drooping began five hours prior to arrival at ED.   The history is provided by the patient.    Past Medical History  Diagnosis Date  . Sinus infection   . Hypertension   . Arthritis   . Spinal stenosis   . Glaucoma(365)   . Carpal tunnel syndrome   . Depression   . Bipolar 2 disorder   . Obesity   . Arthritis   . Glaucoma(365)     Past Surgical History  Procedure Date  . Carpal tunnel release     Left Hand  . Abdominal hysterectomy   . Knee surgery   . Glaucoma surgery     History reviewed. No pertinent family history.  History  Substance Use Topics  . Smoking  status: Never Smoker   . Smokeless tobacco: Not on file  . Alcohol Use: No    OB History    Grav Para Term Preterm Abortions TAB SAB Ect Mult Living                  Review of Systems  Constitutional: Negative for fever and chills.  HENT: Positive for neck pain.   Eyes: Negative for pain and visual disturbance.  Respiratory: Negative for shortness of breath.   Cardiovascular: Negative for chest pain.  Gastrointestinal: Negative for nausea and vomiting.  Neurological: Positive for facial asymmetry, weakness, numbness and headaches. Negative for dizziness, tremors, seizures, syncope, speech difficulty and light-headedness.  Psychiatric/Behavioral: Negative for confusion.  All other systems reviewed and are negative.    Allergies  Ibuprofen  Home Medications   Current Outpatient Rx  Name  Route  Sig  Dispense  Refill  . BUSPIRONE HCL 7.5 MG PO TABS   Oral   Take 7.5 mg by mouth 2 (two) times daily.         Marland Kitchen FLUOXETINE HCL 20 MG PO TABS   Oral   Take 60 mg by mouth daily.         Marland Kitchen GABAPENTIN 100 MG PO CAPS   Oral   Take 100 mg by mouth 3 (three) times daily.         Marland Kitchen LISINOPRIL 40 MG PO TABS   Oral   Take  40 mg by mouth daily.         Marland Kitchen OMEPRAZOLE 20 MG PO CPDR   Oral   Take 20 mg by mouth daily.         . OXYCODONE-ACETAMINOPHEN 10-325 MG PO TABS   Oral   Take 1 tablet by mouth 3 (three) times daily. For pain         . TOPIRAMATE 50 MG PO TABS   Oral   Take 50 mg by mouth 2 (two) times daily as needed.         . TRAVOPROST (BAK FREE) 0.004 % OP SOLN   Both Eyes   Place 1 drop into both eyes at bedtime.           BP 134/87  Pulse 75  Temp 97.8 F (36.6 C)  Resp 20  SpO2 96%  Physical Exam  Nursing note and vitals reviewed. Constitutional: She is oriented to person, place, and time. She appears well-developed and well-nourished.  HENT:  Head: Normocephalic and atraumatic.  Mouth/Throat: Oropharynx is clear and moist.  Eyes:  EOM are normal. Pupils are equal, round, and reactive to light.  Neck: Normal range of motion. Neck supple.  Cardiovascular: Normal rate, regular rhythm and normal heart sounds.   Pulmonary/Chest: Effort normal and breath sounds normal.  Abdominal: Soft. There is no tenderness.  Musculoskeletal: Normal range of motion.  Neurological: She is alert and oriented to person, place, and time.  Reflex Scores:      Brachioradialis reflexes are 2+ on the right side and 2+ on the left side.      Patellar reflexes are 2+ on the right side and 2+ on the left side.      Achilles reflexes are 2+ on the right side and 2+ on the left side.      Tongue deviation to the right Decreased sensation of the left side of the face Slight facial droop when smiling Patient able to raise both eye brows Grip strength 4/5 on left, 5/5 on right Decreased sensation of the fingers of the left hand Muscle strength 3/5 LLE, 5/5 RLE Pronator drift of left side present   Skin: Skin is warm and dry.  Psychiatric: She has a normal mood and affect.    ED Course  Procedures (including critical care time)  Labs Reviewed  CBC WITH DIFFERENTIAL - Abnormal; Notable for the following:    Neutrophils Relative 38 (*)     Lymphocytes Relative 53 (*)     All other components within normal limits  BASIC METABOLIC PANEL - Abnormal; Notable for the following:    Glucose, Bld 104 (*)     All other components within normal limits  GLUCOSE, CAPILLARY - Abnormal; Notable for the following:    Glucose-Capillary 100 (*)     All other components within normal limits  PROTIME-INR  APTT  POCT I-STAT TROPONIN I  URINALYSIS, ROUTINE W REFLEX MICROSCOPIC   Ct Head Wo Contrast  05/19/2012  *RADIOLOGY REPORT*  Clinical Data: Severe migraines for 2 days.  CT HEAD WITHOUT CONTRAST  Technique:  Contiguous axial images were obtained from the base of the skull through the vertex without contrast.  Comparison: None.  Findings: The ventricles  and sulci are symmetrical without significant effacement, displacement, or dilatation. No mass effect or midline shift. No abnormal extra-axial fluid collections. The grey-white matter junction is distinct. Basal cisterns are not effaced. No acute intracranial hemorrhage. No depressed skull fractures.  Visualized paranasal sinuses and mastoid  air cells are not opacified.  IMPRESSION: No acute intracranial abnormalities.   Original Report Authenticated By: Burman Nieves, M.D.      1. Left-sided weakness   2. Neck pain     12:30 AM Discussed with Dr. Thad Ranger with Neurology.  She reports that she will come seen the patient in the ED.  1:45 AM Patient signed out to Dr. Nicanor Alcon  MDM  Patient presenting today with left sided posterior neck pain, associated with left sided weakness and facial droop.  Symptoms began approximately 5 hours prior to arrival in the ED.  Concern for CVA.  Patient beyond the window for tPA.  CT head ordered immediately after exam and Neurology consulted.  Disposition pending Neurology recommendations.          Pascal Lux [place], PA-C 05/19/12 1221

## 2012-06-21 ENCOUNTER — Other Ambulatory Visit: Payer: Self-pay | Admitting: Neurology

## 2012-06-21 DIAGNOSIS — M5412 Radiculopathy, cervical region: Secondary | ICD-10-CM

## 2012-06-27 ENCOUNTER — Ambulatory Visit
Admission: RE | Admit: 2012-06-27 | Discharge: 2012-06-27 | Disposition: A | Discharge status: Home / Self Care | Payer: Medicaid Other | Source: Ambulatory Visit | Attending: Neurology | Admitting: Neurology

## 2012-06-27 DIAGNOSIS — M5412 Radiculopathy, cervical region: Secondary | ICD-10-CM

## 2012-07-25 ENCOUNTER — Ambulatory Visit: Payer: Medicaid Other | Attending: Neurology | Admitting: Physical Therapy

## 2012-09-22 ENCOUNTER — Telehealth: Payer: Self-pay | Admitting: Neurology

## 2012-09-27 ENCOUNTER — Telehealth: Payer: Self-pay | Admitting: *Deleted

## 2012-10-12 ENCOUNTER — Ambulatory Visit (INDEPENDENT_AMBULATORY_CARE_PROVIDER_SITE_OTHER): Payer: Medicaid Other | Admitting: Obstetrics

## 2012-10-12 ENCOUNTER — Encounter: Payer: Self-pay | Admitting: Obstetrics

## 2012-10-12 VITALS — BP 126/77 | HR 98 | Temp 97.0°F | Ht 66.0 in | Wt 327.0 lb

## 2012-10-12 DIAGNOSIS — L0293 Carbuncle, unspecified: Secondary | ICD-10-CM | POA: Insufficient documentation

## 2012-10-12 DIAGNOSIS — B3731 Acute candidiasis of vulva and vagina: Secondary | ICD-10-CM

## 2012-10-12 DIAGNOSIS — B373 Candidiasis of vulva and vagina: Secondary | ICD-10-CM

## 2012-10-12 DIAGNOSIS — R3915 Urgency of urination: Secondary | ICD-10-CM

## 2012-10-12 DIAGNOSIS — L0292 Furuncle, unspecified: Secondary | ICD-10-CM

## 2012-10-12 LAB — POCT URINALYSIS DIPSTICK
Bilirubin, UA: NEGATIVE
Glucose, UA: NEGATIVE
Ketones, UA: NEGATIVE
Spec Grav, UA: 1.015

## 2012-10-12 MED ORDER — AMOXICILLIN-POT CLAVULANATE 875-125 MG PO TABS: 1.0000 | ORAL_TABLET | Freq: Two times a day (BID) | ORAL | Status: DC

## 2012-10-12 MED ORDER — FLUCONAZOLE 150 MG PO TABS: 150.0000 mg | ORAL_TABLET | Freq: Once | ORAL | Status: DC

## 2012-10-12 NOTE — Patient Instructions (Signed)
Management of carbuncles.

## 2012-10-12 NOTE — Progress Notes (Signed)
Subjective:     Brenda Odonnell is a 47 y.o. female here for a problem visit.  Current complaints: urinary urgency and frequency x 1 week.    Gynecologic History No LMP recorded. Patient has had a hysterectomy. Contraception: abstinence     The following portions of the patient's history were reviewed and updated as appropriate: allergies, current medications, past family history, past medical history, past social history, past surgical history and problem list.  Review of Systems Pertinent items are noted in HPI.    Objective:    General appearance: alert, no distress and morbidly obese Abdomen: normal findings: soft, non-tender Pelvic: cervix normal in appearance, no adnexal masses or tenderness, no cervical motion tenderness, uterus normal size, shape, and consistency, vagina normal without discharge and tender vulva lesion c/w carbuncle.    Assessment:    Healthy female exam.   Urinary urgency and frequency.  Morbid obesity.  Currently being evaluated at [facility] for weight loss surgery.  Vulva carbuncle   Plan:    Education reviewed: weight bearing exercise and management of carbuncles. Contraception: abstinence. Follow up in: several months. Augmentin/Diflucan Rx. Urine culture sent.

## 2012-10-14 LAB — URINE CULTURE
Colony Count: NO GROWTH
Organism ID, Bacteria: NO GROWTH

## 2012-11-14 ENCOUNTER — Other Ambulatory Visit: Payer: Self-pay | Admitting: Neurosurgery

## 2012-11-14 DIAGNOSIS — M4802 Spinal stenosis, cervical region: Secondary | ICD-10-CM

## 2012-11-17 ENCOUNTER — Ambulatory Visit
Admission: RE | Admit: 2012-11-17 | Discharge: 2012-11-17 | Disposition: A | Discharge status: Home / Self Care | Payer: Medicaid Other | Source: Ambulatory Visit | Attending: Neurosurgery | Admitting: Neurosurgery

## 2012-11-17 VITALS — BP 119/75 | HR 74 | Ht 67.0 in | Wt 349.8 lb

## 2012-11-17 DIAGNOSIS — M4802 Spinal stenosis, cervical region: Secondary | ICD-10-CM

## 2012-11-17 MED ORDER — ONDANSETRON HCL 4 MG/2ML IJ SOLN
4.0000 mg | Freq: Once | INTRAMUSCULAR | Status: AC
  Administered 2012-11-17: 4 mg via INTRAMUSCULAR

## 2012-11-17 MED ORDER — DIAZEPAM 5 MG PO TABS
10.0000 mg | ORAL_TABLET | Freq: Once | ORAL | Status: AC
  Administered 2012-11-17: 10 mg via ORAL

## 2012-11-17 MED ORDER — HYDROMORPHONE HCL PF 2 MG/ML IJ SOLN
2.0000 mg | Freq: Once | INTRAMUSCULAR | Status: AC
  Administered 2012-11-17: 2 mg via INTRAMUSCULAR

## 2012-11-17 MED ORDER — OXYCODONE-ACETAMINOPHEN 5-325 MG PO TABS
2.0000 | ORAL_TABLET | Freq: Once | ORAL | Status: AC
  Administered 2012-11-17: 2 via ORAL

## 2012-11-17 MED ORDER — IOHEXOL 300 MG/ML  SOLN
10.0000 mL | Freq: Once | INTRAMUSCULAR | Status: AC | PRN
  Administered 2012-11-17: 10 mL via INTRATHECAL

## 2012-11-17 NOTE — Progress Notes (Signed)
Pt states she has been off buspar, prozac and phentermine for the last 2 days.

## 2012-11-17 NOTE — Progress Notes (Addendum)
Came back after CT and upright films with severe headache, cold pack to neck and pain meds given. Velna Hatchet called and given discharge time to pick pt up.

## 2012-11-17 NOTE — Progress Notes (Signed)
Cold pack has helped some with headache but it is still painful.

## 2012-11-20 ENCOUNTER — Emergency Department (HOSPITAL_COMMUNITY)
Admission: EM | Admit: 2012-11-20 | Discharge: 2012-11-20 | Disposition: A | Discharge status: Home / Self Care | Payer: Medicaid Other | Attending: Emergency Medicine | Admitting: Emergency Medicine

## 2012-11-20 ENCOUNTER — Encounter (HOSPITAL_COMMUNITY): Payer: Self-pay | Admitting: Emergency Medicine

## 2012-11-20 DIAGNOSIS — I10 Essential (primary) hypertension: Secondary | ICD-10-CM | POA: Insufficient documentation

## 2012-11-20 DIAGNOSIS — H409 Unspecified glaucoma: Secondary | ICD-10-CM | POA: Insufficient documentation

## 2012-11-20 DIAGNOSIS — G971 Other reaction to spinal and lumbar puncture: Secondary | ICD-10-CM

## 2012-11-20 DIAGNOSIS — R110 Nausea: Secondary | ICD-10-CM | POA: Insufficient documentation

## 2012-11-20 DIAGNOSIS — Z79899 Other long term (current) drug therapy: Secondary | ICD-10-CM | POA: Insufficient documentation

## 2012-11-20 DIAGNOSIS — Z8742 Personal history of other diseases of the female genital tract: Secondary | ICD-10-CM | POA: Insufficient documentation

## 2012-11-20 DIAGNOSIS — Z888 Allergy status to other drugs, medicaments and biological substances status: Secondary | ICD-10-CM | POA: Insufficient documentation

## 2012-11-20 DIAGNOSIS — F3189 Other bipolar disorder: Secondary | ICD-10-CM | POA: Insufficient documentation

## 2012-11-20 DIAGNOSIS — M129 Arthropathy, unspecified: Secondary | ICD-10-CM | POA: Insufficient documentation

## 2012-11-20 DIAGNOSIS — E669 Obesity, unspecified: Secondary | ICD-10-CM | POA: Insufficient documentation

## 2012-11-20 DIAGNOSIS — M4800 Spinal stenosis, site unspecified: Secondary | ICD-10-CM | POA: Insufficient documentation

## 2012-11-20 DIAGNOSIS — Z8739 Personal history of other diseases of the musculoskeletal system and connective tissue: Secondary | ICD-10-CM | POA: Insufficient documentation

## 2012-11-20 DIAGNOSIS — M542 Cervicalgia: Secondary | ICD-10-CM | POA: Insufficient documentation

## 2012-11-20 MED ORDER — DEXAMETHASONE SODIUM PHOSPHATE 10 MG/ML IJ SOLN
10.0000 mg | Freq: Once | INTRAMUSCULAR | Status: AC
  Administered 2012-11-20: 10 mg via INTRAVENOUS
  Filled 2012-11-20: qty 1

## 2012-11-20 MED ORDER — ONDANSETRON HCL 4 MG/2ML IJ SOLN
4.0000 mg | Freq: Once | INTRAMUSCULAR | Status: AC
  Administered 2012-11-20: 4 mg via INTRAVENOUS

## 2012-11-20 MED ORDER — OXYCODONE-ACETAMINOPHEN 5-325 MG PO TABS: 2.0000 | ORAL_TABLET | Freq: Four times a day (QID) | ORAL | Status: DC | PRN

## 2012-11-20 MED ORDER — LORAZEPAM 2 MG/ML IJ SOLN
1.0000 mg | Freq: Once | INTRAMUSCULAR | Status: AC
  Administered 2012-11-20: 1 mg via INTRAVENOUS
  Filled 2012-11-20: qty 1

## 2012-11-20 MED ORDER — ONDANSETRON HCL 4 MG/2ML IJ SOLN
INTRAMUSCULAR | Status: AC
  Filled 2012-11-20: qty 2

## 2012-11-20 MED ORDER — HYDROMORPHONE HCL PF 1 MG/ML IJ SOLN
1.0000 mg | Freq: Once | INTRAMUSCULAR | Status: AC
  Administered 2012-11-20: 1 mg via INTRAVENOUS
  Filled 2012-11-20: qty 1

## 2012-11-20 MED ORDER — ONDANSETRON HCL 4 MG/2ML IJ SOLN
4.0000 mg | Freq: Once | INTRAMUSCULAR | Status: AC
  Administered 2012-11-20: 4 mg via INTRAVENOUS
  Filled 2012-11-20: qty 2

## 2012-11-20 MED ORDER — OXYCODONE-ACETAMINOPHEN 5-325 MG PO TABS: 2.0000 | ORAL_TABLET | Freq: Once | ORAL | Status: DC

## 2012-11-20 MED ORDER — PROMETHAZINE HCL 25 MG PO TABS: 25.0000 mg | ORAL_TABLET | Freq: Four times a day (QID) | ORAL | Status: DC | PRN

## 2012-11-20 MED ORDER — ONDANSETRON 4 MG PO TBDP: 8.0000 mg | ORAL_TABLET | Freq: Once | ORAL | Status: DC

## 2012-11-20 MED ORDER — SODIUM CHLORIDE 0.9 % IV BOLUS (SEPSIS)
1000.0000 mL | Freq: Once | INTRAVENOUS | Status: AC
  Administered 2012-11-20: 1000 mL via INTRAVENOUS

## 2012-11-20 NOTE — ED Provider Notes (Signed)
History    CSN: [id number] Arrival date & time 11/20/12  1331  First MD Initiated Contact with Patient 11/20/12 1401     Chief Complaint  Patient presents with  . Headache  . Neck Pain   (Consider location/radiation/quality/duration/timing/severity/associated sxs/prior Treatment) HPI Comments: Patient is a 47 year old female with history of myelogram done on 11/17/12 who presents today with a headache. She describes it as a pressure and tightness. It has been present since the time of her myelogram, but has gradually worsened. It is associated with nausea, but she is unsure if this is related to her headache or the fact that she has been taking a lot of her narcotics for the pain. She has also attempted drinking fluids and caffeine without relief. The headache is not positional. She gets no relief when she lays flat. Movement makes the pain worse. She denies fever, rash, vomiting, diarrhea, abdominal pain.   The history is provided by the patient. No language interpreter was used.   Past Medical History  Diagnosis Date  . Sinus infection   . Hypertension   . Arthritis   . Spinal stenosis   . Glaucoma   . Carpal tunnel syndrome   . Depression   . Bipolar 2 disorder   . Obesity   . Arthritis   . Glaucoma   . Leiomyoma of uterus, unspecified    Past Surgical History  Procedure Laterality Date  . Carpal tunnel release      Left Hand  . Abdominal hysterectomy    . Knee surgery Left   . Myelogram     Family History  Problem Relation Age of Onset  . Hypertension     History  Substance Use Topics  . Smoking status: Never Smoker   . Smokeless tobacco: Never Used  . Alcohol Use: No   OB History   Grav Para Term Preterm Abortions TAB SAB Ect Mult Living                 Review of Systems  Respiratory: Negative for shortness of breath.   Gastrointestinal: Positive for nausea. Negative for vomiting and abdominal pain.  Skin: Negative for rash.  Neurological: Positive for  headaches.  All other systems reviewed and are negative.    Allergies  Ibuprofen  Home Medications   Current Outpatient Rx  Name  Route  Sig  Dispense  Refill  . brimonidine-timolol (COMBIGAN) 0.2-0.5 % ophthalmic solution   Both Eyes   Place 1 drop into both eyes 2 (two) times daily.         . busPIRone (BUSPAR) 7.5 MG tablet   Oral   Take 7.5 mg by mouth 2 (two) times daily.         Marland Kitchen FLUoxetine (PROZAC) 40 MG capsule   Oral   Take 40 mg by mouth daily.         Marland Kitchen gabapentin (NEURONTIN) 600 MG tablet   Oral   Take 600 mg by mouth 2 (two) times daily.         Marland Kitchen lisinopril (PRINIVIL,ZESTRIL) 40 MG tablet   Oral   Take 40 mg by mouth daily.         Marland Kitchen LORazepam (ATIVAN) 0.5 MG tablet   Oral   Take 0.5 mg by mouth 2 (two) times daily.         Marland Kitchen omeprazole (PRILOSEC) 40 MG capsule   Oral   Take 40 mg by mouth daily.         Marland Kitchen  oxyCODONE-acetaminophen (PERCOCET) 10-325 MG per tablet   Oral   Take 1 tablet by mouth 3 (three) times daily. For pain         . phentermine 37.5 MG capsule   Oral   Take 37.5 mg by mouth every morning.         . topiramate (TOPAMAX) 50 MG tablet   Oral   Take 50 mg by mouth 2 (two) times daily as needed (Migraine).          . Travoprost, BAK Free, (TRAVATAN) 0.004 % SOLN ophthalmic solution   Both Eyes   Place 1 drop into both eyes at bedtime.          BP 142/87  Pulse 75  Temp(Src) 98.2 F (36.8 C) (Oral)  Resp 18  Ht 5\' 7"  (1.702 m)  Wt 298 lb (135.172 kg)  BMI 46.66 kg/m2  SpO2 96% Physical Exam  Nursing note and vitals reviewed. Constitutional: She is oriented to person, place, and time. She appears well-developed and well-nourished. No distress.  HENT:  Head: Normocephalic and atraumatic.  Right Ear: External ear normal.  Left Ear: External ear normal.  Nose: Nose normal.  Mouth/Throat: Oropharynx is clear and moist.  Eyes: Conjunctivae are normal.  Neck: Trachea normal, normal range of motion  and phonation normal.    Cardiovascular: Normal rate, regular rhythm and normal heart sounds.   Pulmonary/Chest: Effort normal and breath sounds normal. No stridor. No respiratory distress. She has no wheezes. She has no rales.  Abdominal: Soft. She exhibits no distension. There is no tenderness.  Musculoskeletal: Normal range of motion.  Neurological: She is alert and oriented to person, place, and time. She has normal strength.  Skin: Skin is warm and dry. She is not diaphoretic. No erythema.  Psychiatric: She has a normal mood and affect. Her behavior is normal.    ED Course  Procedures (including critical care time)  Labs Reviewed - No data to display No results found.  Discussed case with Dr. Alto Denver. He believes this is likely a headache due to the irritation from the dye used in the myelogram. As the headache is not positional in nature, it is unlikely that it is a low pressure headache. Symptomatic treatment as time is the cure for this headache.   No diagnosis found.  MDM  Patient presents with HA s/p myelogram. Discussed case with Dr. Margo Aye who believes this is likely a headache due to the dye from the myelogram and will resolve with time. Unlikely that this is a low pressure headache, but number for [facility] given. She may call in the morning for possible blood patch. No concern for meningitis, temporal arteritis, SAH, or ICH. Given ativan, dilaudid, and decadron in ED. Dr. Freida Busman evaluated patient and agrees with plan. Patient just was given medications. Patient signed out to Dr. Ethelda Chick at shift change. Plan to d/c with response to pain medication.    Mora Bellman, PA-C 11/20/12 1644

## 2012-11-20 NOTE — ED Notes (Signed)
Pt reports had myelogram done on Thursday. Pt reports headache and neck pain onset yesterday. Pt reports unable to turn her head.

## 2012-11-20 NOTE — ED Provider Notes (Signed)
5:35 PM patient alert Glasgow Coma Score 15. Headache and nausea improved after treatment with intravenous opioids and antiemetics. She feels ready to go home.  Doug Sou, MD 11/20/12 1736

## 2012-11-20 NOTE — ED Notes (Signed)
IV start attempted x 2.  Wilhemina Cash, RN attempting IV start.

## 2012-11-20 NOTE — ED Notes (Signed)
IV team paged for IV start. 

## 2012-11-20 NOTE — ED Provider Notes (Signed)
Medical screening examination/treatment/procedure(s) were conducted as a shared visit with non-physician practitioner(s) and myself.  I personally evaluated the patient during the encounter  Pt without signs of meningitis, neuro stable, HA likely result of myelogram, stable for d/c  Toy Baker, MD 11/20/12 1551

## 2012-11-21 ENCOUNTER — Telehealth: Payer: Self-pay | Admitting: Radiology

## 2012-11-21 NOTE — Telephone Encounter (Signed)
Pt states she had myelo last Thursday. Developed a severe headache with nausea and vomiting and went to ER Sunday night. Still have nausea and vomiting, headache may be slightly better. Explained that bedrest would resolve these issues or we could see about a blood patch.  Pt wanted to pursue blood patch. Dr. Cassandria Santee office called for order  11/21/12 at 1505, talked to [place].

## 2012-11-21 NOTE — ED Provider Notes (Signed)
Medical screening examination/treatment/procedure(s) were performed by non-physician practitioner and as supervising physician I was immediately available for consultation/collaboration.  Lamyia Cdebaca T Garen Woolbright, MD 11/21/12 1117 

## 2012-11-22 ENCOUNTER — Telehealth: Payer: Self-pay | Admitting: Radiology

## 2012-11-22 NOTE — Telephone Encounter (Signed)
Pt feel better today and wants to wait for blood patch.

## 2012-11-30 ENCOUNTER — Telehealth: Payer: Self-pay | Admitting: Neurology

## 2012-11-30 ENCOUNTER — Encounter (HOSPITAL_COMMUNITY): Payer: Self-pay

## 2012-11-30 ENCOUNTER — Emergency Department (HOSPITAL_COMMUNITY)
Admission: EM | Admit: 2012-11-30 | Discharge: 2012-12-01 | Disposition: A | Discharge status: Other Acute Inpatient Hospital | Payer: Medicaid Other | Attending: Emergency Medicine | Admitting: Emergency Medicine

## 2012-11-30 DIAGNOSIS — Z79899 Other long term (current) drug therapy: Secondary | ICD-10-CM | POA: Insufficient documentation

## 2012-11-30 DIAGNOSIS — T50901A Poisoning by unspecified drugs, medicaments and biological substances, accidental (unintentional), initial encounter: Secondary | ICD-10-CM

## 2012-11-30 DIAGNOSIS — T43502A Poisoning by unspecified antipsychotics and neuroleptics, intentional self-harm, initial encounter: Secondary | ICD-10-CM | POA: Insufficient documentation

## 2012-11-30 DIAGNOSIS — E669 Obesity, unspecified: Secondary | ICD-10-CM | POA: Insufficient documentation

## 2012-11-30 DIAGNOSIS — I10 Essential (primary) hypertension: Secondary | ICD-10-CM | POA: Insufficient documentation

## 2012-11-30 DIAGNOSIS — Z8739 Personal history of other diseases of the musculoskeletal system and connective tissue: Secondary | ICD-10-CM | POA: Insufficient documentation

## 2012-11-30 DIAGNOSIS — T424X4A Poisoning by benzodiazepines, undetermined, initial encounter: Secondary | ICD-10-CM | POA: Insufficient documentation

## 2012-11-30 DIAGNOSIS — R51 Headache: Secondary | ICD-10-CM | POA: Insufficient documentation

## 2012-11-30 DIAGNOSIS — R45851 Suicidal ideations: Secondary | ICD-10-CM | POA: Insufficient documentation

## 2012-11-30 DIAGNOSIS — R5383 Other fatigue: Secondary | ICD-10-CM | POA: Insufficient documentation

## 2012-11-30 DIAGNOSIS — Z8542 Personal history of malignant neoplasm of other parts of uterus: Secondary | ICD-10-CM | POA: Insufficient documentation

## 2012-11-30 DIAGNOSIS — F3181 Bipolar II disorder: Secondary | ICD-10-CM

## 2012-11-30 DIAGNOSIS — Z90710 Acquired absence of both cervix and uterus: Secondary | ICD-10-CM | POA: Insufficient documentation

## 2012-11-30 DIAGNOSIS — F332 Major depressive disorder, recurrent severe without psychotic features: Secondary | ICD-10-CM

## 2012-11-30 DIAGNOSIS — F39 Unspecified mood [affective] disorder: Secondary | ICD-10-CM | POA: Insufficient documentation

## 2012-11-30 DIAGNOSIS — M2550 Pain in unspecified joint: Secondary | ICD-10-CM | POA: Insufficient documentation

## 2012-11-30 DIAGNOSIS — G43709 Chronic migraine without aura, not intractable, without status migrainosus: Secondary | ICD-10-CM | POA: Insufficient documentation

## 2012-11-30 DIAGNOSIS — Z8709 Personal history of other diseases of the respiratory system: Secondary | ICD-10-CM | POA: Insufficient documentation

## 2012-11-30 DIAGNOSIS — H409 Unspecified glaucoma: Secondary | ICD-10-CM | POA: Insufficient documentation

## 2012-11-30 DIAGNOSIS — F32A Depression, unspecified: Secondary | ICD-10-CM

## 2012-11-30 DIAGNOSIS — IMO0001 Reserved for inherently not codable concepts without codable children: Secondary | ICD-10-CM | POA: Insufficient documentation

## 2012-11-30 DIAGNOSIS — R5381 Other malaise: Secondary | ICD-10-CM | POA: Insufficient documentation

## 2012-11-30 DIAGNOSIS — F3189 Other bipolar disorder: Secondary | ICD-10-CM | POA: Insufficient documentation

## 2012-11-30 DIAGNOSIS — F329 Major depressive disorder, single episode, unspecified: Secondary | ICD-10-CM

## 2012-11-30 LAB — CBC WITH DIFFERENTIAL/PLATELET
Basophils Relative: 0 % (ref 0–1)
Eosinophils Relative: 1 % (ref 0–5)
HCT: 34.1 % — ABNORMAL LOW (ref 36.0–46.0)
Hemoglobin: 11.3 g/dL — ABNORMAL LOW (ref 12.0–15.0)
MCH: 29.8 pg (ref 26.0–34.0)
MCHC: 33.1 g/dL (ref 30.0–36.0)
MCV: 90 fL (ref 78.0–100.0)
Monocytes Absolute: 0.3 10*3/uL (ref 0.1–1.0)
Monocytes Relative: 6 % (ref 3–12)
Neutro Abs: 2.7 10*3/uL (ref 1.7–7.7)

## 2012-11-30 LAB — POCT I-STAT, CHEM 8
BUN: 16 mg/dL (ref 6–23)
Calcium, Ion: 1.15 mmol/L (ref 1.12–1.23)
Creatinine, Ser: 0.8 mg/dL (ref 0.50–1.10)
TCO2: 25 mmol/L (ref 0–100)

## 2012-11-30 NOTE — ED Notes (Signed)
Pt states that she took 14 ativan so she could fall asleep and not wake up

## 2012-11-30 NOTE — Telephone Encounter (Signed)
I called the patient. The patient indicates that she is having headaches in the front of the head, sometimes to the left. The patient feels "foggy headed". The patient had an MRI the brain in January 2014. The patient was seen by Dr. love, and she was having headaches at that time as well. The headaches have worsened over the last 2 months. The patient has significant cervical spinal stenosis at the C5-6 level, and the patient is being seen by Dr. Jeral Fruit. The patient may require surgery. The patient wants an earlier appointment, we will try to get something set up.

## 2012-11-30 NOTE — ED Provider Notes (Signed)
CSN: [id number]     Arrival date & time 11/30/12  2201 History     First MD Initiated Contact with Patient 11/30/12 2231     Chief Complaint  Patient presents with  . Drug Overdose   (Consider location/radiation/quality/duration/timing/severity/associated sxs/prior Treatment) HPI Comments: Patient presents after taking a drug overdose. She states that she's tired of her life and wanted to take something to go to sleep and not wake up. She states she has financial problems and she has chronic neuropathic pain. She has chronic migraines. She states she is just tired of her life. She states she took 12-14 of her Ativan which are 0.5 mg tablets. She states she took her about one to 2 hours prior to arrival. She denies taking any other medications. She denies any alcohol or drug use. She does currently see a counselor and is treated for depression. She denies any other recent illnesses or physical complaints other than her chronic pain.  Patient is a 47 y.o. female presenting with Overdose.  Drug Overdose Associated symptoms include headaches (chronic). Pertinent negatives include no chest pain, no abdominal pain and no shortness of breath.    Past Medical History  Diagnosis Date  . Sinus infection   . Hypertension   . Arthritis   . Spinal stenosis   . Glaucoma   . Carpal tunnel syndrome   . Depression   . Bipolar 2 disorder   . Obesity   . Arthritis   . Glaucoma   . Leiomyoma of uterus, unspecified    Past Surgical History  Procedure Laterality Date  . Carpal tunnel release      Left Hand  . Abdominal hysterectomy    . Knee surgery Left   . Myelogram     Family History  Problem Relation Age of Onset  . Hypertension     History  Substance Use Topics  . Smoking status: Never Smoker   . Smokeless tobacco: Never Used  . Alcohol Use: No   OB History   Grav Para Term Preterm Abortions TAB SAB Ect Mult Living                 Review of Systems  Constitutional: Positive  for fatigue. Negative for fever, chills and diaphoresis.  HENT: Negative for congestion, rhinorrhea and sneezing.   Eyes: Negative.   Respiratory: Negative for cough, chest tightness and shortness of breath.   Cardiovascular: Negative for chest pain and leg swelling.  Gastrointestinal: Negative for nausea, vomiting, abdominal pain, diarrhea and blood in stool.  Genitourinary: Negative for frequency, hematuria, flank pain and difficulty urinating.  Musculoskeletal: Positive for myalgias and arthralgias. Negative for back pain.  Skin: Negative for rash.  Neurological: Positive for headaches (chronic). Negative for dizziness, speech difficulty, weakness and numbness.  Psychiatric/Behavioral: Positive for suicidal ideas and dysphoric mood.    Allergies  Ibuprofen  Home Medications   Current Outpatient Rx  Name  Route  Sig  Dispense  Refill  . brimonidine-timolol (COMBIGAN) 0.2-0.5 % ophthalmic solution   Both Eyes   Place 1 drop into both eyes 2 (two) times daily.         . busPIRone (BUSPAR) 7.5 MG tablet   Oral   Take 7.5 mg by mouth 2 (two) times daily.         Marland Kitchen FLUoxetine (PROZAC) 40 MG capsule   Oral   Take 40 mg by mouth daily.         Marland Kitchen gabapentin (NEURONTIN) 600 MG tablet  Oral   Take 600 mg by mouth 2 (two) times daily.         Marland Kitchen lisinopril (PRINIVIL,ZESTRIL) 40 MG tablet   Oral   Take 40 mg by mouth daily.         Marland Kitchen LORazepam (ATIVAN) 0.5 MG tablet   Oral   Take 0.5 mg by mouth 2 (two) times daily.         Marland Kitchen omeprazole (PRILOSEC) 40 MG capsule   Oral   Take 40 mg by mouth daily.         Marland Kitchen oxyCODONE-acetaminophen (PERCOCET) 10-325 MG per tablet   Oral   Take 1 tablet by mouth 3 (three) times daily. For pain         . oxyCODONE-acetaminophen (PERCOCET/ROXICET) 5-325 MG per tablet   Oral   Take 2 tablets by mouth every 6 (six) hours as needed for pain.   12 tablet   0   . phentermine 37.5 MG capsule   Oral   Take 37.5 mg by mouth every  morning.         . promethazine (PHENERGAN) 25 MG tablet   Oral   Take 1 tablet (25 mg total) by mouth every 6 (six) hours as needed for nausea.   12 tablet   0   . topiramate (TOPAMAX) 50 MG tablet   Oral   Take 50 mg by mouth 2 (two) times daily as needed (Migraine).          . Travoprost, BAK Free, (TRAVATAN) 0.004 % SOLN ophthalmic solution   Both Eyes   Place 1 drop into both eyes at bedtime.          BP 123/68  Pulse 90  Temp(Src) 98.7 F (37.1 C) (Oral)  Resp 18  SpO2 97% Physical Exam  Constitutional: She is oriented to person, place, and time. She appears well-developed and well-nourished.  Obese   HENT:  Head: Normocephalic and atraumatic.  Eyes: Pupils are equal, round, and reactive to light.  Neck: Normal range of motion. Neck supple.  Cardiovascular: Normal rate, regular rhythm and normal heart sounds.   Pulmonary/Chest: Effort normal and breath sounds normal. No respiratory distress. She has no wheezes. She has no rales. She exhibits no tenderness.  Abdominal: Soft. Bowel sounds are normal. There is no tenderness. There is no rebound and no guarding.  Musculoskeletal: Normal range of motion. She exhibits no edema.  Lymphadenopathy:    She has no cervical adenopathy.  Neurological: She is alert and oriented to person, place, and time.  Skin: Skin is warm and dry. No rash noted.  Psychiatric: She has a normal mood and affect.    ED Course   Procedures (including critical care time)  Results for orders placed during the hospital encounter of 11/30/12  CBC WITH DIFFERENTIAL      Result Value Range   WBC 5.8  4.0 - 10.5 K/uL   RBC 3.79 (*) 3.87 - 5.11 MIL/uL   Hemoglobin 11.3 (*) 12.0 - 15.0 g/dL   HCT 16.1 (*) 09.6 - 04.5 %   MCV 90.0  78.0 - 100.0 fL   MCH 29.8  26.0 - 34.0 pg   MCHC 33.1  30.0 - 36.0 g/dL   RDW 40.9  81.1 - 91.4 %   Platelets 231  150 - 400 K/uL   Neutrophils Relative % 47  43 - 77 %   Neutro Abs 2.7  1.7 - 7.7 K/uL    Lymphocytes Relative 46  12 - 46 %   Lymphs Abs 2.7  0.7 - 4.0 K/uL   Monocytes Relative 6  3 - 12 %   Monocytes Absolute 0.3  0.1 - 1.0 K/uL   Eosinophils Relative 1  0 - 5 %   Eosinophils Absolute 0.1  0.0 - 0.7 K/uL   Basophils Relative 0  0 - 1 %   Basophils Absolute 0.0  0.0 - 0.1 K/uL  ETHANOL      Result Value Range   Alcohol, Ethyl (B) <11  0 - 11 mg/dL  ACETAMINOPHEN LEVEL      Result Value Range   Acetaminophen (Tylenol), Serum <15.0  10 - 30 ug/mL  SALICYLATE LEVEL      Result Value Range   Salicylate Lvl <2.0 (*) 2.8 - 20.0 mg/dL  POCT I-STAT, CHEM 8      Result Value Range   Sodium 142  135 - 145 mEq/L   Potassium 3.4 (*) 3.5 - 5.1 mEq/L   Chloride 105  96 - 112 mEq/L   BUN 16  6 - 23 mg/dL   Creatinine, Ser 9.14  0.50 - 1.10 mg/dL   Glucose, Bld 782 (*) 70 - 99 mg/dL   Calcium, Ion 9.56  2.13 - 1.23 mmol/L   TCO2 25  0 - 100 mmol/L   Hemoglobin 11.9 (*) 12.0 - 15.0 g/dL   HCT 08.6 (*) 57.8 - 46.9 %      Date: 11/30/2012  Rate: 86  Rhythm: normal sinus rhythm  QRS Axis: normal  Intervals: normal  ST/T Wave abnormalities: normal  Conduction Disutrbances:none  Narrative Interpretation:   Old EKG Reviewed: none available, unchanged    No results found. 1. Depression   2. Overdose, initial encounter     MDM  Patient is a minor for about 2 hours and she is awake and alert. Her Tylenol aspirin levels are negative. Her alcohol level is negative. I have paged the ACT team at 23:45, however have not got a call back from them. I did put in psych holding orders.      Rolan Bucco, MD 12/01/12 [phone]

## 2012-11-30 NOTE — ED Notes (Signed)
Pt staates she took 14 ativan about 40 minutes ago to go to sleep and not wake up, she states she has no finances, husband left her and she's in a lot of pain

## 2012-11-30 NOTE — Telephone Encounter (Signed)
Patient head hurts  and she can not remember anything. Patient thinks she has MS. Patient is also having dizziness and her movement is off. She has been having these problems for 4 months now and she can not wait till 03-29-13 to be seen. She wants to be seen sooner. Please advise.

## 2012-12-01 ENCOUNTER — Encounter (HOSPITAL_COMMUNITY): Payer: Self-pay | Admitting: *Deleted

## 2012-12-01 ENCOUNTER — Inpatient Hospital Stay (HOSPITAL_COMMUNITY)
Admission: AD | Admit: 2012-12-01 | Discharge: 2012-12-05 | DRG: 885 | Disposition: A | Discharge status: Home / Self Care | Payer: Medicaid Other | Source: Intra-hospital | Attending: Psychiatry | Admitting: Psychiatry

## 2012-12-01 DIAGNOSIS — F329 Major depressive disorder, single episode, unspecified: Secondary | ICD-10-CM

## 2012-12-01 DIAGNOSIS — I10 Essential (primary) hypertension: Secondary | ICD-10-CM | POA: Diagnosis present

## 2012-12-01 DIAGNOSIS — F411 Generalized anxiety disorder: Secondary | ICD-10-CM | POA: Diagnosis present

## 2012-12-01 DIAGNOSIS — T50901A Poisoning by unspecified drugs, medicaments and biological substances, accidental (unintentional), initial encounter: Secondary | ICD-10-CM

## 2012-12-01 DIAGNOSIS — F3289 Other specified depressive episodes: Secondary | ICD-10-CM

## 2012-12-01 DIAGNOSIS — F3189 Other bipolar disorder: Secondary | ICD-10-CM

## 2012-12-01 DIAGNOSIS — F332 Major depressive disorder, recurrent severe without psychotic features: Secondary | ICD-10-CM | POA: Diagnosis present

## 2012-12-01 DIAGNOSIS — Z79899 Other long term (current) drug therapy: Secondary | ICD-10-CM

## 2012-12-01 DIAGNOSIS — G35 Multiple sclerosis: Secondary | ICD-10-CM | POA: Diagnosis present

## 2012-12-01 DIAGNOSIS — F3181 Bipolar II disorder: Secondary | ICD-10-CM

## 2012-12-01 LAB — RAPID URINE DRUG SCREEN, HOSP PERFORMED
Barbiturates: NOT DETECTED
Benzodiazepines: NOT DETECTED
Cocaine: NOT DETECTED
Tetrahydrocannabinol: NOT DETECTED

## 2012-12-01 LAB — URINALYSIS, ROUTINE W REFLEX MICROSCOPIC
Bilirubin Urine: NEGATIVE
Hgb urine dipstick: NEGATIVE
Specific Gravity, Urine: 1.034 — ABNORMAL HIGH (ref 1.005–1.030)
Urobilinogen, UA: 1 mg/dL (ref 0.0–1.0)

## 2012-12-01 MED ORDER — OXYCODONE HCL 5 MG PO TABS
5.0000 mg | ORAL_TABLET | Freq: Three times a day (TID) | ORAL | Status: DC | PRN
  Administered 2012-12-01: 5 mg via ORAL
  Filled 2012-12-01: qty 1

## 2012-12-01 MED ORDER — BUSPIRONE HCL 15 MG PO TABS
7.5000 mg | ORAL_TABLET | Freq: Two times a day (BID) | ORAL | Status: DC
  Administered 2012-12-01: 7.5 mg via ORAL
  Filled 2012-12-01 (×3): qty 1

## 2012-12-01 MED ORDER — TRAVOPROST (BAK FREE) 0.004 % OP SOLN
1.0000 [drp] | Freq: Every day | OPHTHALMIC | Status: DC
  Filled 2012-12-01: qty 2.5

## 2012-12-01 MED ORDER — BRIMONIDINE TARTRATE 0.2 % OP SOLN
1.0000 [drp] | Freq: Two times a day (BID) | OPHTHALMIC | Status: DC
  Administered 2012-12-01 – 2012-12-05 (×8): 1 [drp] via OPHTHALMIC
  Filled 2012-12-01: qty 5

## 2012-12-01 MED ORDER — OXYCODONE-ACETAMINOPHEN 5-325 MG PO TABS
1.0000 | ORAL_TABLET | Freq: Three times a day (TID) | ORAL | Status: DC | PRN
  Administered 2012-12-01 (×2): 1 via ORAL
  Filled 2012-12-01 (×3): qty 1

## 2012-12-01 MED ORDER — GABAPENTIN 300 MG PO CAPS
600.0000 mg | ORAL_CAPSULE | Freq: Two times a day (BID) | ORAL | Status: DC
Start: 2012-12-01 — End: 2012-12-01
  Administered 2012-12-01: 600 mg via ORAL
  Filled 2012-12-01 (×3): qty 2

## 2012-12-01 MED ORDER — TOPIRAMATE 25 MG PO TABS
50.0000 mg | ORAL_TABLET | Freq: Two times a day (BID) | ORAL | Status: DC
  Administered 2012-12-01: 50 mg via ORAL
  Filled 2012-12-01 (×2): qty 1

## 2012-12-01 MED ORDER — PANTOPRAZOLE SODIUM 40 MG PO TBEC
40.0000 mg | DELAYED_RELEASE_TABLET | Freq: Every day | ORAL | Status: DC
  Administered 2012-12-01: 40 mg via ORAL
  Filled 2012-12-01: qty 1

## 2012-12-01 MED ORDER — MAGNESIUM HYDROXIDE 400 MG/5ML PO SUSP: 30.0000 mL | Freq: Every day | ORAL | Status: DC | PRN

## 2012-12-01 MED ORDER — ACETAMINOPHEN 325 MG PO TABS
650.0000 mg | ORAL_TABLET | Freq: Four times a day (QID) | ORAL | Status: DC | PRN
  Administered 2012-12-02: 650 mg via ORAL

## 2012-12-01 MED ORDER — AMITRIPTYLINE HCL 25 MG PO TABS: 50.0000 mg | ORAL_TABLET | Freq: Every evening | ORAL | Status: DC | PRN

## 2012-12-01 MED ORDER — ACETAMINOPHEN 325 MG PO TABS: 650.0000 mg | ORAL_TABLET | Freq: Four times a day (QID) | ORAL | Status: DC | PRN

## 2012-12-01 MED ORDER — OXYCODONE-ACETAMINOPHEN 10-325 MG PO TABS: 1.0000 | ORAL_TABLET | Freq: Three times a day (TID) | ORAL | Status: DC

## 2012-12-01 MED ORDER — AMITRIPTYLINE HCL 50 MG PO TABS
50.0000 mg | ORAL_TABLET | Freq: Every day | ORAL | Status: DC
  Administered 2012-12-01 – 2012-12-02 (×2): 50 mg via ORAL
  Filled 2012-12-01: qty 1
  Filled 2012-12-01: qty 2
  Filled 2012-12-01 (×3): qty 1

## 2012-12-01 MED ORDER — TRAVOPROST (BAK FREE) 0.004 % OP SOLN
1.0000 [drp] | Freq: Every day | OPHTHALMIC | Status: DC
  Administered 2012-12-01 – 2012-12-04 (×4): 1 [drp] via OPHTHALMIC
  Filled 2012-12-01: qty 2.5

## 2012-12-01 MED ORDER — TIMOLOL MALEATE 0.5 % OP SOLN
1.0000 [drp] | Freq: Two times a day (BID) | OPHTHALMIC | Status: DC
  Administered 2012-12-01: 1 [drp] via OPHTHALMIC
  Filled 2012-12-01 (×2): qty 5

## 2012-12-01 MED ORDER — FLUOXETINE HCL 20 MG PO CAPS
40.0000 mg | ORAL_CAPSULE | Freq: Every day | ORAL | Status: DC
  Administered 2012-12-01: 40 mg via ORAL
  Filled 2012-12-01 (×2): qty 2

## 2012-12-01 MED ORDER — BRIMONIDINE TARTRATE 0.2 % OP SOLN
1.0000 [drp] | Freq: Two times a day (BID) | OPHTHALMIC | Status: DC
  Administered 2012-12-01: 1 [drp] via OPHTHALMIC
  Filled 2012-12-01: qty 5

## 2012-12-01 MED ORDER — BRIMONIDINE TARTRATE-TIMOLOL 0.2-0.5 % OP SOLN: 1.0000 [drp] | Freq: Two times a day (BID) | OPHTHALMIC | Status: DC

## 2012-12-01 MED ORDER — LISINOPRIL 40 MG PO TABS
40.0000 mg | ORAL_TABLET | Freq: Every day | ORAL | Status: DC
  Administered 2012-12-01: 40 mg via ORAL
  Filled 2012-12-01 (×2): qty 1

## 2012-12-01 MED ORDER — ALUM & MAG HYDROXIDE-SIMETH 200-200-20 MG/5ML PO SUSP: 30.0000 mL | ORAL | Status: DC | PRN

## 2012-12-01 NOTE — ED Notes (Signed)
Pt unable to ambulate to restroom without wheelchair. Pt states "I normally use a cane." Pt very unsteady while ambulating. RN Autumn D notified

## 2012-12-01 NOTE — ED Notes (Signed)
Pt's belongings include:  Blue/black dress Dark blue dress Beige flip flops Black head wrap

## 2012-12-01 NOTE — ED Notes (Signed)
Telepsych request completed via Fax and telephone.

## 2012-12-01 NOTE — ED Notes (Signed)
pts daughter A.Burress at bedside, pt requests all medications (see list) accounted for by pharmacy tech be released to daughter. Daughter will also sign out all cash and cards when she leaves with security.

## 2012-12-01 NOTE — ED Notes (Signed)
ACT team at bedside.  

## 2012-12-01 NOTE — ED Notes (Signed)
Gena from poison control called to check on pt.

## 2012-12-01 NOTE — ED Notes (Addendum)
Psych NP re-evaluated pt, has decided that pt is a harm to herself and is re-admitting pt.  Silverio Lay EDP and pt's daughter Arinn aware.

## 2012-12-01 NOTE — ED Notes (Signed)
Telepsych in progress. 

## 2012-12-01 NOTE — Progress Notes (Signed)
Based on the severity of the patient's attempt to end her life and multiple stressors--husband left her, Multiple Sclerosis, chronic neck and back pain--patient of [facility], financial issues--no income, awaiting disability, previous suicide attempt, chronic pain, memory loss due to MS, feelings of worthlessness--patient will be admitted to [facility].  Patient agrees with the plan and wants to get help. Nanine Means, PMH-NP

## 2012-12-01 NOTE — BHH Counselor (Signed)
Patient accepted to [facility] by Donell Sievert to Dr. Elsie Saas. The bed assignment is 508-1. Nursing report # is [phone]. Pt's bed @ [facility] is not available at this time, however; expected to become available sometime today. ACT will notify patient's nurse-[place] when [facility] bed is ready.

## 2012-12-01 NOTE — ED Notes (Signed)
[place] [facility] was at bedside.  Pt is accepted at [facility], will let me know when the bed is ready for pt

## 2012-12-01 NOTE — BH Assessment (Signed)
Assessment Note   Brenda Odonnell is a 47 y.o. female who presents voluntarily to [facility] with SI/Depression.  Pt reports the following: she ingested 14 pills(Ativan) in an attempt to harm self.  Pt has been suicidal for an unknown period of time; has 1 previous attempt several years and 1 hospitalization with [facility] in [place], [place].  Pt has current outpatient services with [facility](therapist--Mike Blankenship; unk psychiatrist)    Pt says precip event: (1) Health problems(worsening issues with MS); (2) Financial issues and (3) Did not recv'd financial aid for school.  Pt is compliant with psychotropic meds, but doesn't feel they are effective.  Pt denies HI/AVH.  Pt endorses depression(crying spells, isolation, anger/irritabiity,  anxiety with panic attacks(last attack was 11/30/12), decreased grooming habits and anhedonia.       Axis I: Anxiety Disorder NOS and Major Depression, Recurrent severe Axis II: Deferred Axis III:  Past Medical History  Diagnosis Date  . Sinus infection   . Hypertension   . Arthritis   . Spinal stenosis   . Glaucoma   . Carpal tunnel syndrome   . Depression   . Bipolar 2 disorder   . Obesity   . Arthritis   . Glaucoma   . Leiomyoma of uterus, unspecified    Axis IV: economic problems, educational problems, other psychosocial or environmental problems, problems related to social environment and problems with primary support group Axis V: 31-40 impairment in reality testing  Past Medical History:  Past Medical History  Diagnosis Date  . Sinus infection   . Hypertension   . Arthritis   . Spinal stenosis   . Glaucoma   . Carpal tunnel syndrome   . Depression   . Bipolar 2 disorder   . Obesity   . Arthritis   . Glaucoma   . Leiomyoma of uterus, unspecified     Past Surgical History  Procedure Laterality Date  . Carpal tunnel release      Left Hand  . Abdominal hysterectomy    . Knee surgery Left   . Myelogram      Family History:   Family History  Problem Relation Age of Onset  . Hypertension      Social History:  reports that she has never smoked. She has never used smokeless tobacco. She reports that she does not drink alcohol or use illicit drugs.  Additional Social History:  Alcohol / Drug Use Pain Medications: See MAR  Prescriptions: See MAR  Over the Counter: See MAR  History of alcohol / drug use?: No history of alcohol / drug abuse Longest period of sobriety (when/how long): None   CIWA: CIWA-Ar BP: 128/76 mmHg Pulse Rate: 90 COWS:    Allergies:  Allergies  Allergen Reactions  . Ibuprofen Nausea And Vomiting    Due to gird    Home Medications:  (Not in a hospital admission)  OB/GYN Status:  No LMP recorded. Patient has had a hysterectomy.  General Assessment Data Location of Assessment: [facility] Living Arrangements: Alone Can pt return to current living arrangement?: Yes Admission Status: Voluntary Is patient capable of signing voluntary admission?: Yes Transfer from: Acute Hospital Referral Source: MD  Education Status Is patient currently in school?: No Current Grade: None  Highest grade of school patient has completed: None  Name of school: None  Contact person: None   Risk to self Suicidal Ideation: Yes-Currently Present Suicidal Intent: Yes-Currently Present Is patient at risk for suicide?: Yes Suicidal Plan?: Yes-Currently Present Specify Current  Suicidal Plan: Overdose on medications  Access to Means: Yes Specify Access to Suicidal Means: Pills/medications  What has been your use of drugs/alcohol within the last 12 months?: Pt denies  Previous Attempts/Gestures: Yes How many times?: 1 Other Self Harm Risks: None  Triggers for Past Attempts: Unpredictable Intentional Self Injurious Behavior: None Family Suicide History: No Recent stressful life event(s): Financial Problems;Other (Comment) (Health problems; Issues with financial aid ) Persecutory voices/beliefs?:  No Depression: Yes Depression Symptoms: Tearfulness;Isolating;Loss of interest in usual pleasures;Feeling worthless/self pity;Feeling angry/irritable Substance abuse history and/or treatment for substance abuse?: No Suicide prevention information given to non-admitted patients: Not applicable  Risk to Others Homicidal Ideation: No Thoughts of Harm to Others: No Current Homicidal Intent: No Current Homicidal Plan: No Access to Homicidal Means: No Identified Victim: None  History of harm to others?: No Assessment of Violence: None Noted Violent Behavior Description: None  Does patient have access to weapons?: No Criminal Charges Pending?: No Does patient have a court date: No  Psychosis Hallucinations: None noted Delusions: None noted  Mental Status Report Appear/Hygiene: Disheveled Eye Contact: Good Motor Activity: Unremarkable Speech: Logical/coherent Level of Consciousness: Alert Mood: Depressed;Sad;Empty Affect: Depressed;Sad Anxiety Level: None Thought Processes: Coherent;Relevant Judgement: Impaired Orientation: Person;Place;Time;Situation Obsessive Compulsive Thoughts/Behaviors: None  Cognitive Functioning Concentration: Normal Memory: Recent Intact;Remote Intact IQ: Average Insight: Poor Impulse Control: Poor Appetite: Good Weight Loss: 0 Weight Gain: 0 Sleep: Decreased Total Hours of Sleep: 3 Vegetative Symptoms: Decreased grooming  ADLScreening [facility] Assessment Services) Patient's cognitive ability adequate to safely complete daily activities?: Yes Patient able to express need for assistance with ADLs?: Yes Independently performs ADLs?: Yes (appropriate for developmental age)  Abuse/Neglect [facility]) Physical Abuse: Denies Verbal Abuse: Denies Sexual Abuse: Denies  Prior Inpatient Therapy Prior Inpatient Therapy: Yes Prior Therapy Dates: Approx 49yrs ago  Prior Therapy Facilty/Provider(s): [facility] [place]  Reason for Treatment:  SI/Depression   Prior Outpatient Therapy Prior Outpatient Therapy: Yes Prior Therapy Dates: Current  Prior Therapy Facilty/Provider(s): [facility]--Mike Blankenship  Reason for Treatment: Therapy/Med Mgt   ADL Screening (condition at time of admission) Patient's cognitive ability adequate to safely complete daily activities?: Yes Is the patient deaf or have difficulty hearing?: No Does the patient have difficulty seeing, even when wearing glasses/contacts?: No Does the patient have difficulty concentrating, remembering, or making decisions?: No Patient able to express need for assistance with ADLs?: Yes Does the patient have difficulty dressing or bathing?: No Independently performs ADLs?: Yes (appropriate for developmental age) Does the patient have difficulty walking or climbing stairs?: No Weakness of Legs: None Weakness of Arms/Hands: None  Home Assistive Devices/Equipment Home Assistive Devices/Equipment: None  Therapy Consults (therapy consults require a physician order) PT Evaluation Needed: No OT Evalulation Needed: No SLP Evaluation Needed: No Abuse/Neglect Assessment (Assessment to be complete while patient is alone) Physical Abuse: Denies Verbal Abuse: Denies Sexual Abuse: Denies Exploitation of patient/patient's resources: Denies Self-Neglect: Denies Values / Beliefs Cultural Requests During Hospitalization: None Spiritual Requests During Hospitalization: None Consults Spiritual Care Consult Needed: No Social Work Consult Needed: No Merchant navy officer (For Healthcare) Advance Directive: Patient does not have advance directive;Patient would not like information Pre-existing out of facility DNR order (yellow form or pink MOST form): No Nutrition Screen- [facility] Adult/[facility] Patient's home diet: Regular  Additional Information 1:1 In Past 12 Months?: No CIRT Risk: No Elopement Risk: No Does patient have medical clearance?: Yes     Disposition:   Disposition Initial Assessment Completed for this Encounter: Yes Disposition of Patient:  Inpatient treatment program;Referred to (Accepted by Donell Sievert, pending an avail 500 hall bed ) Type of inpatient treatment program: Adult Patient referred to: Other (Comment) (Accepted by Donell Sievert, pending an avail 500 hall bed)  On Site Evaluation by:   Reviewed with Physician:     Murrell Redden 12/01/2012 4:53 AM

## 2012-12-01 NOTE — Progress Notes (Signed)
Pt attended Karaoke group and actively participated by singing several songs.

## 2012-12-01 NOTE — Progress Notes (Signed)
Agree with note and disposition

## 2012-12-01 NOTE — ED Notes (Signed)
MD at bedside. Psych

## 2012-12-01 NOTE — ED Notes (Signed)
Contacted pt's daughter, notified her that pt is discharged.  Arinn B states that she is on her way

## 2012-12-01 NOTE — ED Notes (Signed)
Pt c/o 10/10 pain to neck, back and shoulders. Pt medicated with PRN Percocet.

## 2012-12-01 NOTE — ED Provider Notes (Addendum)
5:23 AM patient ambulates with a cane at baseline and psych ED will not except patient. telepsych consult requested. ACT aware.  Results for orders placed during the hospital encounter of 11/30/12  CBC WITH DIFFERENTIAL      Result Value Range   WBC 5.8  4.0 - 10.5 K/uL   RBC 3.79 (*) 3.87 - 5.11 MIL/uL   Hemoglobin 11.3 (*) 12.0 - 15.0 g/dL   HCT 16.1 (*) 09.6 - 04.5 %   MCV 90.0  78.0 - 100.0 fL   MCH 29.8  26.0 - 34.0 pg   MCHC 33.1  30.0 - 36.0 g/dL   RDW 40.9  81.1 - 91.4 %   Platelets 231  150 - 400 K/uL   Neutrophils Relative % 47  43 - 77 %   Neutro Abs 2.7  1.7 - 7.7 K/uL   Lymphocytes Relative 46  12 - 46 %   Lymphs Abs 2.7  0.7 - 4.0 K/uL   Monocytes Relative 6  3 - 12 %   Monocytes Absolute 0.3  0.1 - 1.0 K/uL   Eosinophils Relative 1  0 - 5 %   Eosinophils Absolute 0.1  0.0 - 0.7 K/uL   Basophils Relative 0  0 - 1 %   Basophils Absolute 0.0  0.0 - 0.1 K/uL  URINALYSIS, ROUTINE W REFLEX MICROSCOPIC      Result Value Range   Color, Urine YELLOW  YELLOW   APPearance CLEAR  CLEAR   Specific Gravity, Urine 1.034 (*) 1.005 - 1.030   pH 5.5  5.0 - 8.0   Glucose, UA NEGATIVE  NEGATIVE mg/dL   Hgb urine dipstick NEGATIVE  NEGATIVE   Bilirubin Urine NEGATIVE  NEGATIVE   Ketones, ur NEGATIVE  NEGATIVE mg/dL   Protein, ur NEGATIVE  NEGATIVE mg/dL   Urobilinogen, UA 1.0  0.0 - 1.0 mg/dL   Nitrite NEGATIVE  NEGATIVE   Leukocytes, UA NEGATIVE  NEGATIVE  URINE RAPID DRUG SCREEN (HOSP PERFORMED)      Result Value Range   Opiates NONE DETECTED  NONE DETECTED   Cocaine NONE DETECTED  NONE DETECTED   Benzodiazepines NONE DETECTED  NONE DETECTED   Amphetamines NONE DETECTED  NONE DETECTED   Tetrahydrocannabinol NONE DETECTED  NONE DETECTED   Barbiturates NONE DETECTED  NONE DETECTED  ETHANOL      Result Value Range   Alcohol, Ethyl (B) <11  0 - 11 mg/dL  ACETAMINOPHEN LEVEL      Result Value Range   Acetaminophen (Tylenol), Serum <15.0  10 - 30 ug/mL  SALICYLATE LEVEL    Result Value Range   Salicylate Lvl <2.0 (*) 2.8 - 20.0 mg/dL  POCT I-STAT, CHEM 8      Result Value Range   Sodium 142  135 - 145 mEq/L   Potassium 3.4 (*) 3.5 - 5.1 mEq/L   Chloride 105  96 - 112 mEq/L   BUN 16  6 - 23 mg/dL   Creatinine, Ser 7.82  0.50 - 1.10 mg/dL   Glucose, Bld 956 (*) 70 - 99 mg/dL   Calcium, Ion 2.13  0.86 - 1.23 mmol/L   TCO2 25  0 - 100 mmol/L   Hemoglobin 11.9 (*) 12.0 - 15.0 g/dL   HCT 57.8 (*) 46.9 - 62.9 %         Sunnie Nielsen, MD 12/01/12 0524  6:59 AM PSY consult completed, DR Jacky Kindle recommends OK for discharge home and outpatient community mental health follow up. She is not  suicidal. Resources provided.   Sunnie Nielsen, MD 12/01/12 [phone]

## 2012-12-01 NOTE — ED Notes (Signed)
Family at bedside. Daughter 

## 2012-12-01 NOTE — ED Provider Notes (Signed)
Psych PA saw patient, thinks that patient still suicidal and needs to be admitted instead of being discharged. I canceled discharge. Now pending placement at [facility]  Richardean Canal, MD 12/01/12 [phone]

## 2012-12-01 NOTE — ED Notes (Signed)
Terry with ACT at bedside.

## 2012-12-01 NOTE — ED Notes (Signed)
Patient ambulated to restroom with no assistance. Patient stated that she voided while in restroom. Patients gait was steady, and did not require aid device.

## 2012-12-01 NOTE — ED Notes (Addendum)
BH PA with patient

## 2012-12-01 NOTE — ED Notes (Signed)
Charge nurse Tiffany notified that patient reports that she is unable to ambulate without her cane. Patient transport to to room 18 due the fact that she is a fall risk and unable to ambulate without her cane.

## 2012-12-01 NOTE — Consult Note (Signed)
Reason for Consult:Eval for IP Psychaitric Mgmt Referring Physician: [facility] EDP  Brenda Odonnell is an 47 y.o. female.  HPI: Pt is a 47 y/o AAF presenting to [facility] ED voluntarily with reported SA ( patient ingested 12- 14 0.5 mg pills of Ativan) due to worsening depressive sx rated 10/10 that include feelings of guilt, hopelessness, helplessness, and sadness. preciptating factors include financial challenges, separation from her husband and problems with school. Patient also endorse HI towards family members with whom have let her down. Patient also experienced some verbal hallucinations telling her to kill herself and just end it. Patient denies any concurrent paranoia, delusions or visual hallucinations.  Past Medical History  Diagnosis Date  . Sinus infection   . Hypertension   . Arthritis   . Spinal stenosis   . Glaucoma   . Carpal tunnel syndrome   . Depression   . Bipolar 2 disorder   . Obesity   . Arthritis   . Glaucoma   . Leiomyoma of uterus, unspecified     Past Surgical History  Procedure Laterality Date  . Carpal tunnel release      Left Hand  . Abdominal hysterectomy    . Knee surgery Left   . Myelogram      Family History  Problem Relation Age of Onset  . Hypertension      Social History:  reports that she has never smoked. She has never used smokeless tobacco. She reports that she does not drink alcohol or use illicit drugs.  Allergies:  Allergies  Allergen Reactions  . Ibuprofen Nausea And Vomiting    Due to gird    Medications: I have reviewed the patient's current medications.  Results for orders placed during the hospital encounter of 11/30/12 (from the past 48 hour(s))  CBC WITH DIFFERENTIAL     Status: Abnormal   Collection Time    11/30/12 10:40 PM      Result Value Range   WBC 5.8  4.0 - 10.5 K/uL   RBC 3.79 (*) 3.87 - 5.11 MIL/uL   Hemoglobin 11.3 (*) 12.0 - 15.0 g/dL   HCT 96.0 (*) 45.4 - 09.8 %   MCV 90.0  78.0 - 100.0 fL   MCH 29.8  26.0 -  34.0 pg   MCHC 33.1  30.0 - 36.0 g/dL   RDW 11.9  14.7 - 82.9 %   Platelets 231  150 - 400 K/uL   Neutrophils Relative % 47  43 - 77 %   Neutro Abs 2.7  1.7 - 7.7 K/uL   Lymphocytes Relative 46  12 - 46 %   Lymphs Abs 2.7  0.7 - 4.0 K/uL   Monocytes Relative 6  3 - 12 %   Monocytes Absolute 0.3  0.1 - 1.0 K/uL   Eosinophils Relative 1  0 - 5 %   Eosinophils Absolute 0.1  0.0 - 0.7 K/uL   Basophils Relative 0  0 - 1 %   Basophils Absolute 0.0  0.0 - 0.1 K/uL  ETHANOL     Status: None   Collection Time    11/30/12 10:40 PM      Result Value Range   Alcohol, Ethyl (B) <11  0 - 11 mg/dL   Comment:            LOWEST DETECTABLE LIMIT FOR     SERUM ALCOHOL IS 11 mg/dL     FOR MEDICAL PURPOSES ONLY  ACETAMINOPHEN LEVEL     Status: None  Collection Time    11/30/12 10:40 PM      Result Value Range   Acetaminophen (Tylenol), Serum <15.0  10 - 30 ug/mL   Comment:            THERAPEUTIC CONCENTRATIONS VARY     SIGNIFICANTLY. A RANGE OF 10-30     ug/mL MAY BE AN EFFECTIVE     CONCENTRATION FOR MANY PATIENTS.     HOWEVER, SOME ARE BEST TREATED     AT CONCENTRATIONS OUTSIDE THIS     RANGE.     ACETAMINOPHEN CONCENTRATIONS     >150 ug/mL AT 4 HOURS AFTER     INGESTION AND >50 ug/mL AT 12     HOURS AFTER INGESTION ARE     OFTEN ASSOCIATED WITH TOXIC     REACTIONS.  SALICYLATE LEVEL     Status: Abnormal   Collection Time    11/30/12 10:40 PM      Result Value Range   Salicylate Lvl <2.0 (*) 2.8 - 20.0 mg/dL  POCT I-STAT, CHEM 8     Status: Abnormal   Collection Time    11/30/12 10:46 PM      Result Value Range   Sodium 142  135 - 145 mEq/L   Potassium 3.4 (*) 3.5 - 5.1 mEq/L   Chloride 105  96 - 112 mEq/L   BUN 16  6 - 23 mg/dL   Creatinine, Ser 1.61  0.50 - 1.10 mg/dL   Glucose, Bld 096 (*) 70 - 99 mg/dL   Calcium, Ion 0.45  4.09 - 1.23 mmol/L   TCO2 25  0 - 100 mmol/L   Hemoglobin 11.9 (*) 12.0 - 15.0 g/dL   HCT 81.1 (*) 91.4 - 78.2 %  URINALYSIS, ROUTINE W REFLEX  MICROSCOPIC     Status: Abnormal   Collection Time    12/01/12 12:02 AM      Result Value Range   Color, Urine YELLOW  YELLOW   APPearance CLEAR  CLEAR   Specific Gravity, Urine 1.034 (*) 1.005 - 1.030   pH 5.5  5.0 - 8.0   Glucose, UA NEGATIVE  NEGATIVE mg/dL   Hgb urine dipstick NEGATIVE  NEGATIVE   Bilirubin Urine NEGATIVE  NEGATIVE   Ketones, ur NEGATIVE  NEGATIVE mg/dL   Protein, ur NEGATIVE  NEGATIVE mg/dL   Urobilinogen, UA 1.0  0.0 - 1.0 mg/dL   Nitrite NEGATIVE  NEGATIVE   Leukocytes, UA NEGATIVE  NEGATIVE   Comment: MICROSCOPIC NOT DONE ON URINES WITH NEGATIVE PROTEIN, BLOOD, LEUKOCYTES, NITRITE, OR GLUCOSE <1000 mg/dL.  URINE RAPID DRUG SCREEN (HOSP PERFORMED)     Status: None   Collection Time    12/01/12 12:02 AM      Result Value Range   Opiates NONE DETECTED  NONE DETECTED   Cocaine NONE DETECTED  NONE DETECTED   Benzodiazepines NONE DETECTED  NONE DETECTED   Amphetamines NONE DETECTED  NONE DETECTED   Tetrahydrocannabinol NONE DETECTED  NONE DETECTED   Barbiturates NONE DETECTED  NONE DETECTED   Comment:            DRUG SCREEN FOR MEDICAL PURPOSES     ONLY.  IF CONFIRMATION IS NEEDED     FOR ANY PURPOSE, NOTIFY LAB     WITHIN 5 DAYS.                LOWEST DETECTABLE LIMITS     FOR URINE DRUG SCREEN     Drug Class  Cutoff (ng/mL)     Amphetamine      1000     Barbiturate      200     Benzodiazepine   200     Tricyclics       300     Opiates          300     Cocaine          300     THC              50    No results found.  Review of Systems  Constitutional:       Obese  Eyes:       Glaucoma  Cardiovascular:       Htn  Musculoskeletal:       OA, spinal stenosis, CTS  Psychiatric/Behavioral:       Patient endorsing rated 10/10 depressive sx with SI/SA o/d on Ativan along with HI towards family members. Patient also endorsing audible hallucinations telling her to end it and kill herself.   All other systems reviewed and are  negative.   Blood pressure 128/76, pulse 90, temperature 98.7 F (37.1 C), temperature source Oral, resp. rate 15, SpO2 97.00%. Physical Exam  Constitutional: She is oriented to person, place, and time. She appears well-developed and well-nourished.  Neck: Normal range of motion. Neck supple. No thyromegaly present.  Neurological: She is alert and oriented to person, place, and time. No cranial nerve deficit.  Skin: Skin is warm and dry.  Psychiatric:  Patient appearing as stated age faily well groomed and maintaining fair eye contact. Patient with sad flat affect with normal tone non pressured speech. Her thought processes area goal directed and linear with fait insight and acknowledgement of impulsive behaviors and poor judgement    Assessment/Plan: 1) Admit to [address] [facility] pending bed for mgmt, safety and stabilization of Bipolar 2 2) Social work to aid in OP support services and f/u with [facility] 3) Administration of psychotropics and applicable psychotherapeutic interventions 4) Mgmt of chronic co-morbid conditions  Ivin Rosenbloom E 12/01/2012, 4:26 AM

## 2012-12-01 NOTE — BH Assessment (Signed)
[facility] Assessment Progress Note      Client has been accepted to bed 506-2 when the bed is available later this afternoon. She has been accepted by Dr. Ladona Ridgel to Dr. Shela Commons.

## 2012-12-01 NOTE — ED Notes (Signed)
Flip flop shoes, scarf and dress are only belongings to be secured in lockers.

## 2012-12-01 NOTE — Progress Notes (Signed)
Voluntary admission for a 47 y.o. Female with flat affect, depressed mood who reports that she overdosed on 14  0.5mg  of Ativan.  Pt. Reports that "I gave up hope yesterday" because of financial and health issues.  Pt. Reports that she had tried to get disability 3 times.  Hx.  HTN, Glaucoma, MS and chronic neck and back pain.  Pt. Denies SI/HI and denies A/V hallucinations and contracts for safety.  Pt. Oriented to unit.  Support given.

## 2012-12-02 ENCOUNTER — Encounter (HOSPITAL_COMMUNITY): Payer: Self-pay | Admitting: Psychiatry

## 2012-12-02 DIAGNOSIS — F332 Major depressive disorder, recurrent severe without psychotic features: Principal | ICD-10-CM

## 2012-12-02 DIAGNOSIS — F411 Generalized anxiety disorder: Secondary | ICD-10-CM

## 2012-12-02 LAB — HEPATIC FUNCTION PANEL
ALT: 13 U/L (ref 0–35)
AST: 10 U/L (ref 0–37)
Total Protein: 7.3 g/dL (ref 6.0–8.3)

## 2012-12-02 MED ORDER — HYDROXYZINE HCL 25 MG PO TABS
25.0000 mg | ORAL_TABLET | Freq: Three times a day (TID) | ORAL | Status: DC
  Administered 2012-12-02 – 2012-12-05 (×10): 25 mg via ORAL
  Filled 2012-12-02: qty 9
  Filled 2012-12-02 (×4): qty 1
  Filled 2012-12-02: qty 9
  Filled 2012-12-02: qty 1
  Filled 2012-12-02 (×2): qty 9
  Filled 2012-12-02 (×4): qty 1
  Filled 2012-12-02: qty 9
  Filled 2012-12-02 (×2): qty 1
  Filled 2012-12-02: qty 9
  Filled 2012-12-02 (×2): qty 1

## 2012-12-02 MED ORDER — BUSPIRONE HCL 5 MG PO TABS
5.0000 mg | ORAL_TABLET | Freq: Three times a day (TID) | ORAL | Status: DC
  Administered 2012-12-02 – 2012-12-05 (×11): 5 mg via ORAL
  Filled 2012-12-02: qty 1
  Filled 2012-12-02: qty 9
  Filled 2012-12-02: qty 1
  Filled 2012-12-02: qty 9
  Filled 2012-12-02: qty 1
  Filled 2012-12-02 (×2): qty 9
  Filled 2012-12-02 (×2): qty 1
  Filled 2012-12-02 (×2): qty 9
  Filled 2012-12-02 (×7): qty 1

## 2012-12-02 MED ORDER — HYDROCHLOROTHIAZIDE 25 MG PO TABS
25.0000 mg | ORAL_TABLET | Freq: Every day | ORAL | Status: DC
  Administered 2012-12-02 – 2012-12-05 (×4): 25 mg via ORAL
  Filled 2012-12-02 (×7): qty 1

## 2012-12-02 MED ORDER — PANTOPRAZOLE SODIUM 40 MG PO TBEC
40.0000 mg | DELAYED_RELEASE_TABLET | Freq: Every day | ORAL | Status: DC
  Administered 2012-12-02 – 2012-12-05 (×4): 40 mg via ORAL
  Filled 2012-12-02 (×7): qty 1

## 2012-12-02 MED ORDER — FLUOXETINE HCL 20 MG PO CAPS
40.0000 mg | ORAL_CAPSULE | Freq: Every day | ORAL | Status: DC
  Administered 2012-12-02 – 2012-12-05 (×4): 40 mg via ORAL
  Filled 2012-12-02 (×4): qty 2
  Filled 2012-12-02: qty 6
  Filled 2012-12-02: qty 2
  Filled 2012-12-02: qty 6
  Filled 2012-12-02: qty 2

## 2012-12-02 MED ORDER — TOPIRAMATE 25 MG PO TABS
50.0000 mg | ORAL_TABLET | Freq: Every day | ORAL | Status: DC
  Administered 2012-12-02 – 2012-12-03 (×2): 50 mg via ORAL
  Filled 2012-12-02 (×5): qty 2

## 2012-12-02 MED ORDER — LORAZEPAM 0.5 MG PO TABS
0.5000 mg | ORAL_TABLET | Freq: Every day | ORAL | Status: DC
  Administered 2012-12-02 – 2012-12-05 (×4): 0.5 mg via ORAL
  Filled 2012-12-02 (×4): qty 1

## 2012-12-02 MED ORDER — GABAPENTIN 300 MG PO CAPS
600.0000 mg | ORAL_CAPSULE | Freq: Two times a day (BID) | ORAL | Status: DC
  Administered 2012-12-02 – 2012-12-03 (×3): 600 mg via ORAL
  Filled 2012-12-02 (×7): qty 2

## 2012-12-02 MED ORDER — LISINOPRIL 40 MG PO TABS
40.0000 mg | ORAL_TABLET | Freq: Every day | ORAL | Status: DC
  Administered 2012-12-02 – 2012-12-05 (×4): 40 mg via ORAL
  Filled 2012-12-02 (×3): qty 1
  Filled 2012-12-02: qty 2
  Filled 2012-12-02 (×3): qty 1

## 2012-12-02 MED ORDER — METHOCARBAMOL 500 MG PO TABS
500.0000 mg | ORAL_TABLET | Freq: Three times a day (TID) | ORAL | Status: DC
  Administered 2012-12-02 – 2012-12-05 (×4): 500 mg via ORAL
  Filled 2012-12-02 (×16): qty 1

## 2012-12-02 MED ORDER — HYDROXYZINE HCL 25 MG PO TABS: 25.0000 mg | ORAL_TABLET | Freq: Four times a day (QID) | ORAL | Status: DC | PRN

## 2012-12-02 MED ORDER — NABUMETONE 500 MG PO TABS
500.0000 mg | ORAL_TABLET | Freq: Two times a day (BID) | ORAL | Status: DC
Start: 2012-12-02 — End: 2012-12-03
  Filled 2012-12-02 (×6): qty 1

## 2012-12-02 NOTE — BHH Group Notes (Signed)
[facility] LCSW Aftercare Discharge Planning Group Note   12/02/2012 8:45 AM  Participation Quality:  Did Not Attend   Reyes Ivan, LCSWA 12/02/2012 9:39 AM

## 2012-12-02 NOTE — [facility] Group Notes (Signed)
Adult Psychoeducational Group Note  Date:  12/02/2012 Time:  10:00 PM  Group Topic/Focus:  Wrap-Up Group:   The focus of this group is to help patients review their daily goal of treatment and discuss progress on daily workbooks.  Participation Level:  Active  Participation Quality:  Appropriate  Affect:  Appropriate  Cognitive:  Appropriate  Insight: Appropriate  Engagement in Group:  Engaged  Modes of Intervention:  Discussion  Additional Comments:  Rosangelica stated her goal was to be better than yesterday.  She said she accomplished this goal by being more positive.  Caroll Rancher A 12/02/2012, 10:00 PM

## 2012-12-02 NOTE — Consult Note (Signed)
Reviewed the information documented and agree with the treatment plan.  Bladen Umar,JANARDHAHA R. 12/02/2012 7:14 PM

## 2012-12-02 NOTE — BHH Suicide Risk Assessment (Signed)
Suicide Risk Assessment  Admission Assessment     Nursing information obtained from:    Demographic factors:    Current Mental Status:    Loss Factors:    Historical Factors:    Risk Reduction Factors:     CLINICAL FACTORS:   Severe Anxiety and/or Agitation Depression:   Aggression Anhedonia Hopelessness Impulsivity Insomnia Recent sense of peace/wellbeing Severe Chronic Pain Currently Psychotic Unstable or Poor Therapeutic Relationship Previous Psychiatric Diagnoses and Treatments Medical Diagnoses and Treatments/Surgeries  COGNITIVE FEATURES THAT CONTRIBUTE TO RISK:  Closed-mindedness Loss of executive function Polarized thinking    SUICIDE RISK:   Moderate:  Frequent suicidal ideation with limited intensity, and duration, some specificity in terms of plans, no associated intent, good self-control, limited dysphoria/symptomatology, some risk factors present, and identifiable protective factors, including available and accessible social support.  PLAN OF CARE: Patient is admitted voluntarily and emergently for MDD, severe without psychosis and suicidal attempt with her benzo's (10-12 pills). She has been following up with [facility].   I certify that inpatient services furnished can reasonably be expected to improve the patient's condition.   Nehemiah Settle., MD 12/02/2012, 9:37 AM

## 2012-12-02 NOTE — Progress Notes (Signed)
[name], female   DOB: 03-Jan-1966, 47 y.o.   MRN: [id number]  Patient asleep; no s/s of distress noted at this time. Pt's respirations regular and unlabored.

## 2012-12-02 NOTE — Tx Team (Signed)
Interdisciplinary Treatment Plan Update (Adult)  Date: 12/02/2012  Time Reviewed:  9:45 AM  Progress in Treatment: Attending groups: Yes Participating in groups:  Yes Taking medication as prescribed:  Yes Tolerating medication:  Yes Family/Significant othe contact made: CSW assessing  Patient understands diagnosis:  Yes Discussing patient identified problems/goals with staff:  Yes Medical problems stabilized or resolved:  Yes Denies suicidal/homicidal ideation: Yes Issues/concerns per patient self-inventory:  Yes Other:  New problem(s) identified: N/A  Discharge Plan or Barriers: CSW assessing for appropriate referrals.  Reason for Continuation of Hospitalization: Anxiety Depression Medication Stabilization  Comments: N/A  Estimated length of stay: 3-5 days  For review of initial/current patient goals, please see plan of care.  Attendees: Patient:     Family:     Physician:  Dr. Javier Glazier 12/02/2012 11:02 AM   Nursing:   Alease Frame, RN 12/02/2012 11:02 AM   Clinical Social Worker:  Reyes Ivan, LCSWA 12/02/2012 11:02 AM   Other: Chinita Greenland, RN 12/02/2012 11:02 AM   Other:  Frankey Shown, MA care coordination 12/02/2012 11:02 AM   Other:  Onnie Boer, case manager 12/02/2012 11:02 AM   Other:  Seabron Spates, RN 12/02/2012 11:02 AM  Other:    Other:    Other:    Other:    Other:    Other:     Scribe for Treatment Team:   Carmina Miller, 12/02/2012 11:02 AM

## 2012-12-02 NOTE — [facility] Group Notes (Signed)
[facility] LCSW Group Therapy  12/02/2012  1:15 PM   Type of Therapy:  Group Therapy  Participation Level:  Did Not Attend  Liara Holm Horton, LCSWA 12/02/2012 2:58 PM   
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
## 2012-12-02 NOTE — Progress Notes (Signed)
D patient did not complete self inventory sheet today, worried she is not going to receive her Percocet for pain, explained to her that MDs do not usually order drugs like Percocet for patients here, new order was written for Relafen to assist w/her pain, very depressed today, would not use her walker at lunch and instead walked to lunch without her walker, taking meds as ordered by MD, not attending group this morning, staying in bed and had to be awakened for meds at noon. A q69min safety checks continue and support offered, encouraged to go to group and participate R patient remains safe on the unit

## 2012-12-02 NOTE — Progress Notes (Signed)
Adult Psychoeducational Group Note  Date:  12/02/2012 Time:  2:55 PM  Group Topic/Focus:  Relapse Prevention Planning:   The focus of this group is to define relapse and discuss the need for planning to combat relapse.  Participation Level:  Did Not Attend    Brenda Odonnell 12/02/2012, 2:55 PM

## 2012-12-02 NOTE — Progress Notes (Signed)
Writer spoke with patient concerning her day and she reports that it has been ok other than her aching. Patient was offered tylenol and refused reporting that it doesn't really help her pain. Patient reports her pain at a 9 and aches all over. Patient has been lying in bed resting most of the evening and did attend wrap up group. Writer informed patient that a call would be placed to the P.A. on call and see if order could be received for pain medication. Patient currently denies si/hi/a/v hallucinations. Support and encouragement offered, safety maintained on unit, will continue to monitor.

## 2012-12-03 MED ORDER — OXYCODONE-ACETAMINOPHEN 5-325 MG PO TABS
1.0000 | ORAL_TABLET | Freq: Four times a day (QID) | ORAL | Status: DC | PRN
  Administered 2012-12-03 – 2012-12-05 (×5): 1 via ORAL
  Filled 2012-12-03 (×5): qty 1

## 2012-12-03 MED ORDER — AMITRIPTYLINE HCL 50 MG PO TABS
100.0000 mg | ORAL_TABLET | Freq: Every day | ORAL | Status: DC
  Administered 2012-12-03 – 2012-12-04 (×2): 100 mg via ORAL
  Filled 2012-12-03: qty 6
  Filled 2012-12-03: qty 2
  Filled 2012-12-03 (×3): qty 1
  Filled 2012-12-03: qty 6

## 2012-12-03 MED ORDER — DIPHENHYDRAMINE HCL 25 MG PO CAPS
25.0000 mg | ORAL_CAPSULE | Freq: Every evening | ORAL | Status: DC | PRN
  Administered 2012-12-03 – 2012-12-04 (×2): 25 mg via ORAL

## 2012-12-03 MED ORDER — TOPIRAMATE 100 MG PO TABS
100.0000 mg | ORAL_TABLET | Freq: Every day | ORAL | Status: DC
  Administered 2012-12-04 – 2012-12-05 (×2): 100 mg via ORAL
  Filled 2012-12-03 (×2): qty 3
  Filled 2012-12-03 (×3): qty 1

## 2012-12-03 MED ORDER — GABAPENTIN 300 MG PO CAPS
600.0000 mg | ORAL_CAPSULE | Freq: Three times a day (TID) | ORAL | Status: DC
  Administered 2012-12-04 – 2012-12-05 (×6): 600 mg via ORAL
  Filled 2012-12-03 (×2): qty 18
  Filled 2012-12-03 (×4): qty 2
  Filled 2012-12-03 (×2): qty 18
  Filled 2012-12-03: qty 2
  Filled 2012-12-03: qty 18
  Filled 2012-12-03 (×2): qty 2
  Filled 2012-12-03: qty 18

## 2012-12-03 MED ORDER — OXYCODONE-ACETAMINOPHEN 5-325 MG PO TABS
1.0000 | ORAL_TABLET | Freq: Once | ORAL | Status: AC
  Administered 2012-12-03: 1 via ORAL
  Filled 2012-12-03: qty 1

## 2012-12-03 NOTE — Progress Notes (Signed)
Brenda Odonnell has had a difficult day. She has taken her meds as ordered. SHe participates in her groups, engaging in the discussions as well as the group work.    A She says she is riddled with pain " all of the time" and that the medication she has been receiving here  not helping her pain. SHe has cried much of the day and cannot ambulate this afternoon due to the pain.   R MD on call, Dr. Shela Commons , was notified and RN received order to restart percocet. Percocet given to pt per pt request. POC contd

## 2012-12-03 NOTE — Progress Notes (Signed)
Adult Psychoeducational Group Note  Date:  12/03/2012 Time:  2:23 PM  Group Topic/Focus:  Healthy Communication:   The focus of this group is to discuss communication, barriers to communication, as well as healthy ways to communicate with others.  Participation Level:  Did Not Attend   Brenda Odonnell 12/03/2012, 2:23 PM

## 2012-12-03 NOTE — Progress Notes (Signed)
[facility] MD Progress Note  12/03/2012 5:00 PM Brenda Odonnell  MRN:  [id number] Subjective:  Patient has complaining about to chronic and uncontrollable pain patient stated her pain is ridiculous. Patient stated she was taken Relafen yesterday which made her sick and stopped taking it. Patient has been tolerating rest of the medication with no difficulties. Patient has multiple sclerosis and has a mobility limitations. Patient has limited participation in unit activities.  Diagnosis:  Axis I: Major Depression, Recurrent severe  ADL's:  Intact  Sleep: Poor  Appetite:  Fair  Suicidal Ideation:  Patient has a suicidal thoughts but no intentions or plans Homicidal Ideation:  None AEB (as evidenced by):  Psychiatric Specialty Exam: ROS  Blood pressure 112/77, pulse 80, temperature 98.2 F (36.8 C), temperature source Oral, resp. rate 16, height 5\' 7"  (1.702 m), weight 137.893 kg (304 lb).Body mass index is 47.6 kg/(m^2).  General Appearance: Disheveled and Guarded  Eye Solicitor::  Fair  Speech:  Clear and Coherent  Volume:  Decreased  Mood:  Anxious, Depressed, Dysphoric, Hopeless and Worthless  Affect:  Constricted, Depressed and Flat  Thought Process:  Coherent and Disorganized  Orientation:  Full (Time, Place, and Person)  Thought Content:  NA  Suicidal Thoughts:  Yes.  without intent/plan  Homicidal Thoughts:  No  Memory:  Immediate;   Fair  Judgement:  Impaired  Insight:  Lacking  Psychomotor Activity:  Psychomotor Retardation  Concentration:  Fair  Recall:  Fair  Akathisia:  NA  Handed:  Right  AIMS (if indicated):     Assets:  Communication Skills Desire for Improvement Resilience Social Support Transportation  Sleep:  Number of Hours: 1.5   Current Medications: Current Facility-Administered Medications  Medication Dose Route Frequency Provider Last Rate Last Dose  . acetaminophen (TYLENOL) tablet 650 mg  650 mg Oral Q6H PRN Kerry Hough, PA-C   650 mg at 12/02/12  4098  . alum & mag hydroxide-simeth (MAALOX/MYLANTA) 200-200-20 MG/5ML suspension 30 mL  30 mL Oral Q4H PRN Kerry Hough, PA-C      . amitriptyline (ELAVIL) tablet 50 mg  50 mg Oral QHS Kerry Hough, PA-C   50 mg at 12/02/12 2220  . brimonidine (ALPHAGAN) 0.2 % ophthalmic solution 1 drop  1 drop Both Eyes BID Sanjuana Kava, NP   1 drop at 12/03/12 [phone]  . busPIRone (BUSPAR) tablet 5 mg  5 mg Oral TID Nanine Means, NP   5 mg at 12/03/12 0843  . diphenhydrAMINE (BENADRYL) capsule 25 mg  25 mg Oral QHS PRN Court Joy, PA-C   25 mg at 12/03/12 0115  . FLUoxetine (PROZAC) capsule 40 mg  40 mg Oral Daily Nanine Means, NP   40 mg at 12/03/12 0843  . gabapentin (NEURONTIN) capsule 600 mg  600 mg Oral BID Nanine Means, NP   600 mg at 12/03/12 0842  . hydrochlorothiazide (HYDRODIURIL) tablet 25 mg  25 mg Oral Daily Nanine Means, NP   25 mg at 12/03/12 0843  . hydrOXYzine (ATARAX/VISTARIL) tablet 25 mg  25 mg Oral TID Nanine Means, NP   25 mg at 12/03/12 0800  . lisinopril (PRINIVIL,ZESTRIL) tablet 40 mg  40 mg Oral Daily Nanine Means, NP   40 mg at 12/03/12 0843  . LORazepam (ATIVAN) tablet 0.5 mg  0.5 mg Oral Daily Nanine Means, NP   0.5 mg at 12/03/12 0843  . magnesium hydroxide (MILK OF MAGNESIA) suspension 30 mL  30 mL Oral Daily PRN Kerry Hough,  PA-C      . methocarbamol (ROBAXIN) tablet 500 mg  500 mg Oral TID Nanine Means, NP   500 mg at 12/03/12 0800  . nabumetone (RELAFEN) tablet 500 mg  500 mg Oral BID Nanine Means, NP      . pantoprazole (PROTONIX) EC tablet 40 mg  40 mg Oral Daily Nanine Means, NP   40 mg at 12/03/12 0843  . topiramate (TOPAMAX) tablet 50 mg  50 mg Oral Daily Nanine Means, NP   50 mg at 12/03/12 0843  . Travoprost (BAK Free) (TRAVATAN) 0.004 % ophthalmic solution SOLN 1 drop  1 drop Both Eyes QHS Sanjuana Kava, NP   1 drop at 12/02/12 2220    Lab Results:  Results for orders placed during the hospital encounter of 12/01/12 (from the past 48 hour(s))  HEPATIC  FUNCTION PANEL     Status: Abnormal   Collection Time    12/02/12  6:15 AM      Result Value Range   Total Protein 7.3  6.0 - 8.3 g/dL   Albumin 3.3 (*) 3.5 - 5.2 g/dL   AST 10  0 - 37 U/L   ALT 13  0 - 35 U/L   Alkaline Phosphatase 72  39 - 117 U/L   Total Bilirubin 0.3  0.3 - 1.2 mg/dL   Bilirubin, Direct <1.6  0.0 - 0.3 mg/dL   Indirect Bilirubin NOT CALCULATED  0.3 - 0.9 mg/dL  TSH     Status: None   Collection Time    12/02/12  6:15 AM      Result Value Range   TSH 0.765  0.350 - 4.500 uIU/mL    Physical Findings: AIMS: Facial and Oral Movements Muscles of Facial Expression: None, normal Lips and Perioral Area: None, normal Jaw: None, normal Tongue: None, normal,Extremity Movements Upper (arms, wrists, hands, fingers): None, normal Lower (legs, knees, ankles, toes): None, normal, Trunk Movements Neck, shoulders, hips: None, normal, Overall Severity Severity of abnormal movements (highest score from questions above): None, normal Incapacitation due to abnormal movements: None, normal Patient's awareness of abnormal movements (rate only patient's report): No Awareness, Dental Status Current problems with teeth and/or dentures?: No Does patient usually wear dentures?: No  CIWA:    COWS:     Treatment Plan Summary: Daily contact with patient to assess and evaluate symptoms and progress in treatment Medication management  Plan: Include elevil 100 mg at bedtime  Increase Neurontin to 600 mg 3 times daily Discontinue Relafen - making her sick  Increase Topamax to 100 mg daily  1. continue for crisis management and stabilization. 2. Medication management to reduce current symptoms to base line and improve the patient's overall level of functioning. 3. Treat health problems as indicated. 4. Develop treatment plan to decrease risk of relapse upon discharge and to reduce the need for readmission. 5. Psycho-social education regarding relapse prevention and self care. 6.  Health care follow up as needed for medical problems. 7. Restart home medications where appropriate.   Medical Decision Making Problem Points:  Established problem, worsening (2), New problem, with no additional work-up planned (3) and Review of psycho-social stressors (1) Data Points:  Review or order clinical lab tests (1) Review or order medicine tests (1) Review of medication regiment & side effects (2) Review of new medications or change in dosage (2)  I certify that inpatient services furnished can reasonably be expected to improve the patient's condition.   Nehemiah Settle., M.D. 12/03/2012, 5:00 PM

## 2012-12-03 NOTE — [facility] Group Notes (Signed)
[facility] Group Notes: (Clinical Social Work)   12/03/2012      Type of Therapy:  Group Therapy   Participation Level:  Did Not Attend    Ambrose Mantle, LCSW 12/03/2012, 5:38 PM

## 2012-12-04 NOTE — Progress Notes (Signed)
[name], female   DOB: 12/01/1965, 47 y.o.   MRN: [id number] [facility] MD Progress Note  12/04/2012 12:40 PM Brenda Odonnell  MRN:  [id number]  Subjective:  Patient has no complaints today and reportedly her medication management is working well to control her emotional and physical complaints. Patient was able to walk her room and participate in group and able to socialize. Patient has been tolerating her current medication with no side effects. Patient has multiple sclerosis and has a mobility limitations. Patient has denied current symptoms of suicidal ideations intention or plans has no evidence of psychotic symptoms either.  Diagnosis:  Axis I: Major Depression, Recurrent severe  ADL's:  Intact  Sleep: Poor  Appetite:  Fair  Suicidal Ideation:  Patient has a suicidal thoughts but no intentions or plans Homicidal Ideation:  None AEB (as evidenced by):  Psychiatric Specialty Exam: ROS  Blood pressure 105/71, pulse 90, temperature 97.7 F (36.5 C), temperature source Oral, resp. rate 18, height 5\' 7"  (1.702 m), weight 137.893 kg (304 lb).Body mass index is 47.6 kg/(m^2).  General Appearance: Disheveled and Guarded  Eye Solicitor::  Fair  Speech:  Clear and Coherent  Volume:  Decreased  Mood:  Anxious, Depressed, Dysphoric, Hopeless and Worthless  Affect:  Constricted, Depressed and Flat  Thought Process:  Coherent and Disorganized  Orientation:  Full (Time, Place, and Person)  Thought Content:  NA  Suicidal Thoughts:  Yes.  without intent/plan  Homicidal Thoughts:  No  Memory:  Immediate;   Fair  Judgement:  Impaired  Insight:  Lacking  Psychomotor Activity:  Psychomotor Retardation  Concentration:  Fair  Recall:  Fair  Akathisia:  NA  Handed:  Right  AIMS (if indicated):     Assets:  Communication Skills Desire for Improvement Resilience Social Support Transportation  Sleep:  Number of Hours: 4.5   Current Medications: Current Facility-Administered  Medications  Medication Dose Route Frequency Provider Last Rate Last Dose  . acetaminophen (TYLENOL) tablet 650 mg  650 mg Oral Q6H PRN Kerry Hough, PA-C   650 mg at 12/02/12 7829  . alum & mag hydroxide-simeth (MAALOX/MYLANTA) 200-200-20 MG/5ML suspension 30 mL  30 mL Oral Q4H PRN Kerry Hough, PA-C      . amitriptyline (ELAVIL) tablet 100 mg  100 mg Oral QHS Nehemiah Settle, MD   100 mg at 12/03/12 2148  . brimonidine (ALPHAGAN) 0.2 % ophthalmic solution 1 drop  1 drop Both Eyes BID Sanjuana Kava, NP   1 drop at 12/04/12 0900  . busPIRone (BUSPAR) tablet 5 mg  5 mg Oral TID Nanine Means, NP   5 mg at 12/04/12 0902  . diphenhydrAMINE (BENADRYL) capsule 25 mg  25 mg Oral QHS PRN Court Joy, PA-C   25 mg at 12/03/12 0115  . FLUoxetine (PROZAC) capsule 40 mg  40 mg Oral Daily Nanine Means, NP   40 mg at 12/04/12 0900  . gabapentin (NEURONTIN) capsule 600 mg  600 mg Oral TID Nehemiah Settle, MD   600 mg at 12/04/12 0903  . hydrochlorothiazide (HYDRODIURIL) tablet 25 mg  25 mg Oral Daily Nanine Means, NP   25 mg at 12/04/12 0903  . hydrOXYzine (ATARAX/VISTARIL) tablet 25 mg  25 mg Oral TID Nanine Means, NP   25 mg at 12/04/12 0904  . lisinopril (PRINIVIL,ZESTRIL) tablet 40 mg  40 mg Oral Daily Nanine Means, NP   40 mg at 12/04/12 0903  . LORazepam (ATIVAN) tablet 0.5 mg  0.5 mg Oral Daily Nanine Means, NP   0.5 mg at 12/03/12 0843  . magnesium hydroxide (MILK OF MAGNESIA) suspension 30 mL  30 mL Oral Daily PRN Kerry Hough, PA-C      . methocarbamol (ROBAXIN) tablet 500 mg  500 mg Oral TID Nanine Means, NP   500 mg at 12/03/12 0800  . oxyCODONE-acetaminophen (PERCOCET/ROXICET) 5-325 MG per tablet 1 tablet  1 tablet Oral Q6H PRN Nehemiah Settle, MD   1 tablet at 12/04/12 0645  . pantoprazole (PROTONIX) EC tablet 40 mg  40 mg Oral Daily Nanine Means, NP   40 mg at 12/04/12 0904  . topiramate (TOPAMAX) tablet 100 mg  100 mg Oral Daily Nehemiah Settle,  MD   100 mg at 12/04/12 0900  . Travoprost (BAK Free) (TRAVATAN) 0.004 % ophthalmic solution SOLN 1 drop  1 drop Both Eyes QHS Sanjuana Kava, NP   1 drop at 12/03/12 2148    Lab Results:  No results found for this or any previous visit (from the past 48 hour(s)).  Physical Findings: AIMS: Facial and Oral Movements Muscles of Facial Expression: None, normal Lips and Perioral Area: None, normal Jaw: None, normal Tongue: None, normal,Extremity Movements Upper (arms, wrists, hands, fingers): None, normal Lower (legs, knees, ankles, toes): None, normal, Trunk Movements Neck, shoulders, hips: None, normal, Overall Severity Severity of abnormal movements (highest score from questions above): None, normal Incapacitation due to abnormal movements: None, normal Patient's awareness of abnormal movements (rate only patient's report): No Awareness, Dental Status Current problems with teeth and/or dentures?: No Does patient usually wear dentures?: No  CIWA:    COWS:     Treatment Plan Summary: Daily contact with patient to assess and evaluate symptoms and progress in treatment Medication management  Plan: Continue elevil 100 mg at bedtime  Continue Neurontin to 600 mg 3 times daily Continue Topamax to 100 mg daily  Continue Percocet every 6 hours as needed for pain 1. continue for crisis management and stabilization. 2. Medication management to reduce current symptoms to base line and improve the patient's overall level of functioning. 3. Treat health problems as indicated. 4. Develop treatment plan to decrease risk of relapse upon discharge and to reduce the need for readmission. 5. Psycho-social education regarding relapse prevention and self care. 6. Health care follow up as needed for medical problems. 7. Restart home medications where appropriate. 8. Disposition plans are in progress    Medical Decision Making Problem Points:  Established problem, worsening (2), New problem, with  no additional work-up planned (3) and Review of psycho-social stressors (1) Data Points:  Review or order clinical lab tests (1) Review or order medicine tests (1) Review of medication regiment & side effects (2) Review of new medications or change in dosage (2)  I certify that inpatient services furnished can reasonably be expected to improve the patient's condition.   Nehemiah Settle., M.D. 12/04/2012, 12:40 PM

## 2012-12-04 NOTE — [facility] Group Notes (Signed)
[facility] Group Notes: (Clinical Social Work)   12/04/2012      Type of Therapy:  Group Therapy   Participation Level:  Did Not Attend    Ambrose Mantle, LCSW 12/04/2012, 5:23 PM

## 2012-12-04 NOTE — Progress Notes (Signed)
Writer spoke with patient and she reports that her day started off pretty rough but is getting better. Patient reports that her pain was pretty severe today but her pain medication was restarted and hopefully this will help. Patient was observed up in the dayroom watching tv and interacting with peers earlier. Patient attended group this evening and has been compliant with hs meds. Patient currently denies si/hi/a/v hallucinations. Support and encouragement offered, safety maintained on unit with 15 min checks, will continue to monitor.

## 2012-12-04 NOTE — Progress Notes (Signed)
Adult Psychoeducational Group Note  Date:  12/03/2012 Time:  800 pm  Group Topic/Focus:  Wrap-Up Group:   The focus of this group is to help patients review their daily goal of treatment and discuss progress on daily workbooks.  Participation Level:  Active  Participation Quality:  Appropriate  Affect:  Appropriate  Cognitive:  Appropriate  Insight: Appropriate  Engagement in Group:  Engaged  Modes of Intervention:  Discussion  Additional Comments:  Pt reported that she was dealing with her health and pain that often triggers her mental health symptoms. Pt reported that she came to grip that she has a disease and needs to accept it.  Pt expressed that she does not want it to change who she is and be more grateful to still be here. Marvis Moeller A 12/04/2012, 12:34 AM

## 2012-12-04 NOTE — Progress Notes (Signed)
Psychoeducational Group Note  Psychoeducational Group Note  Date: 12/04/2012 Time:  0930  Group Topic/Focus:  Gratefulness:  The focus of this group is to help patients identify what two things they are most grateful for in their lives. What helps ground them and to center them on their work to their recovery.  Participation Level:  Active  Participation Quality:  Appropriate  Affect:  WNL  Cognitive:  Alert  Insight:  improving  Engagement in Group:  Engaged  Additional Comments:    [name]  

## 2012-12-04 NOTE — Progress Notes (Signed)
[facility] Group Notes:  (Nursing/MHT/Case Management/Adjunct)  Date:  12/04/2012  Time:  2000  Type of Therapy:  Psychoeducational Skills  Participation Level:  Active  Participation Quality:  Appropriate  Affect:  Excited  Cognitive:  Appropriate  Insight:  Good  Engagement in Group:  Engaged  Modes of Intervention:  Education  Summary of Progress/Problems: The patient stated in group this evening that she had a good day overall. She stated that her energy level was a 10 out of a possible 10.  Her goal for tomorrow is to work on being "happier".   Tori Cupps S 12/04/2012, 10:09 PM

## 2012-12-04 NOTE — Progress Notes (Signed)
Psychoeducational Group Note  Date:  12/04/2012 Time:  1015  Group Topic/Focus:  Making Healthy Choices:   The focus of this group is to help patients identify negative/unhealthy choices they were using prior to admission and identify positive/healthier coping strategies to replace them upon discharge.  Participation Level:  Active  Participation Quality:  Appropriate  Affect:  Appropriate  Cognitive:  Appropriate  Insight:  Improving  Engagement in Group:  Engaged  Additional Comments:    Dione Housekeeper 12/04/2012

## 2012-12-05 DIAGNOSIS — F411 Generalized anxiety disorder: Secondary | ICD-10-CM

## 2012-12-05 MED ORDER — FLUOXETINE HCL 40 MG PO CAPS: 40.0000 mg | ORAL_CAPSULE | Freq: Every day | ORAL | Status: DC

## 2012-12-05 MED ORDER — AMITRIPTYLINE HCL 100 MG PO TABS: 100.0000 mg | ORAL_TABLET | Freq: Every day | ORAL | Status: DC

## 2012-12-05 MED ORDER — OMEPRAZOLE 40 MG PO CPDR: 40.0000 mg | DELAYED_RELEASE_CAPSULE | Freq: Every day | ORAL | Status: DC

## 2012-12-05 MED ORDER — DIPHENHYDRAMINE HCL 25 MG PO CAPS: 25.0000 mg | ORAL_CAPSULE | Freq: Every evening | ORAL | Status: DC | PRN

## 2012-12-05 MED ORDER — LORAZEPAM 0.5 MG PO TABS: 0.5000 mg | ORAL_TABLET | Freq: Every day | ORAL | Status: DC

## 2012-12-05 MED ORDER — BUSPIRONE HCL 5 MG PO TABS: 5.0000 mg | ORAL_TABLET | Freq: Three times a day (TID) | ORAL | Status: DC

## 2012-12-05 MED ORDER — GABAPENTIN 600 MG PO TABS: 600.0000 mg | ORAL_TABLET | Freq: Three times a day (TID) | ORAL | Status: DC

## 2012-12-05 MED ORDER — LISINOPRIL 40 MG PO TABS: 40.0000 mg | ORAL_TABLET | Freq: Every day | ORAL | Status: AC

## 2012-12-05 MED ORDER — TOPIRAMATE 100 MG PO TABS: 100.0000 mg | ORAL_TABLET | Freq: Every day | ORAL | Status: DC

## 2012-12-05 MED ORDER — HYDROXYZINE HCL 25 MG PO TABS: 25.0000 mg | ORAL_TABLET | Freq: Three times a day (TID) | ORAL | Status: DC

## 2012-12-05 NOTE — [facility] Group Notes (Signed)
[facility] LCSW Aftercare Discharge Planning Group Note   12/05/2012 8:45 AM  Participation Quality:  Did Not Attend  Brenda Odonnell, LCSWA 12/05/2012 9:36 AM

## 2012-12-05 NOTE — Progress Notes (Signed)
Adult Psychoeducational Group Note  Date:  12/05/2012 Time:  1:41 PM  Group Topic/Focus:  Wellness Toolbox:   The focus of this group is to discuss various aspects of wellness, balancing those aspects and exploring ways to increase the ability to experience wellness.  Patients will create a wellness toolbox for use upon discharge.  Participation Level:  Active  Participation Quality:  Appropriate  Affect:  Appropriate  Cognitive:  Appropriate  Insight: Appropriate and Good  Engagement in Group:  Engaged  Modes of Intervention:  Clarification, Discussion and Exploration  Additional Comments:  Pt attended and actively participated in group with MHT. Pt discussed discharging today and appreciated staff and the hospital for their help.   Lorin Mercy 12/05/2012, 1:41 PM

## 2012-12-05 NOTE — Progress Notes (Signed)
[facility] Adult Case Management Discharge Plan :  Will you be returning to the same living situation after discharge: Yes,  returning home At discharge, do you have transportation home?:Yes,  access to transportation Do you have the ability to pay for your medications:Yes,  access to meds  Release of information consent forms completed and in the chart;  Patient's signature needed at discharge.  Patient to Follow up at: Follow-up Information   Follow up with [facility] On 12/12/2012. (Appointment scheduled at 12:00 pm with Donnie Aho, NP for medication management)    Contact information:   [address] [place], [place] [address] Phone: [phone] Fax: [phone]      Follow up with [facility] On 12/13/2012. (Appointment scheduled at 12:00 pm with Kathlene November for intake assessment for additional services)    Contact information:   [address], [address] [place], [place] [address] Phone: [phone] Fax: [phone]      Patient denies SI/HI:   Yes,  denies SI/HI    Safety Planning and Suicide Prevention discussed:  Yes,  discussed with pt and pt's daughter.  See suicide prevention education note.   Carmina Miller 12/05/2012, 11:21 AM

## 2012-12-05 NOTE — Progress Notes (Signed)
Recreation Therapy Notes  Date: 08.04.2014 Time: 2:15pm Location: [address] Dayroom  Group Topic: Problem Solving  Goal Area(s) Addresses:  Patient will effectively work in a team with other group members. Patient will verbalize skills needed to make activity successful.  Patient will verbalize ways to use skills used in group session post d/c.  Behavioral Response: Did not attend.    Marykay Lex Ophia Shamoon, LRT/CTRS   Jearl Klinefelter 12/05/2012 10:24 PM

## 2012-12-05 NOTE — Discharge Summary (Signed)
Physician Discharge Summary Note  Patient:  Brenda Odonnell is an 47 y.o., female MRN:  [id number] DOB:  07-31-1965 Patient phone:  [phone] (home)  Patient address:   [address] [place] [address],   Date of Admission:  12/01/2012 Date of Discharge:  12/05/2012  Reason for Admission:  Major depressive disorder  Discharge Diagnoses: Principal Problem:   Major depressive disorder, recurrent episode, severe degree, without mention of psychotic behavior Active Problems:   Overdose  Review of Systems  Constitutional: Negative.  Negative for fever, chills, weight loss, malaise/fatigue and diaphoresis.  HENT: Negative for congestion and sore throat.   Eyes: Negative for blurred vision, double vision and photophobia.  Respiratory: Negative for cough, shortness of breath and wheezing.   Cardiovascular: Negative for chest pain, palpitations and PND.  Gastrointestinal: Negative for heartburn, nausea, vomiting, abdominal pain, diarrhea and constipation.  Musculoskeletal: Negative for myalgias, joint pain and falls.  Neurological: Negative for dizziness, tingling, tremors, sensory change, speech change, focal weakness, seizures, loss of consciousness, weakness and headaches.  Endo/Heme/Allergies: Negative for polydipsia. Does not bruise/bleed easily.  Psychiatric/Behavioral: Negative for depression, suicidal ideas, hallucinations, memory loss and substance abuse. The patient is not nervous/anxious and does not have insomnia.   Assessment:  AXIS I: Major Depression Recurrent severe, GAD  AXIS II: Deferred  AXIS III:  Past Medical History   Diagnosis  Date   .  Sinus infection    .  Hypertension    .  Arthritis    .  Spinal stenosis    .  Glaucoma    .  Carpal tunnel syndrome    .  Depression    .  Bipolar 2 disorder    .  Obesity    .  Arthritis    .  Glaucoma    .  Leiomyoma of uterus, unspecified     AXIS IV: economic problems, occupational problems, other psychosocial  or environmental problems, problems related to social environment and problems with primary support group  AXIS V: 41-50 serious symptoms   Level of Care:  OP  Hospital Course:       Brenda Odonnell was admitted after she arrived in the ED via ambulance after taking an overdose of her ativan. She had become overwhelmed with her life and multiple stressors including finances, her MS, and her chronic pain. She was evaluated and accepted to [facility] for further stabilization and crisis management.        The patient was admitted to the unit and evaluated. The symptoms were identified as depression, anhedonia, fatigue, hopelessness, helplessness, anxiety with a suicide attempt.  She also had excessive worry as well. She was oriented to the unit and encouraged to participate in unit programming.  Her medication management was initiated.        Each day the patient was evaluated by a clinical provider to ascertain the patient's response to treatment.  Improvement was noted by the patient's report of decreasing symptoms, improved sleep and appetite, affect, medication tolerance, behavior, and participation in unit programming.  The patient was asked each day to complete a self inventory noting mood, mental status, pain, new symptoms, anxiety and concerns.        The patient responded well to medication and being in a therapeutic and supportive environment. Positive and appropriate behavior was noted and the patient was motivated for recovery. The patient worked closely with the treatment team and case manager to develop a discharge plan with appropriate goals. Coping skills, problem solving as  well as relaxation therapies were also part of the unit programming.         By the day of discharge the patient was in much improved condition than upon admission.  Symptoms were reported as significantly decreased or resolved completely.  The patient denied SI/HI and voiced no AVH. She was motivated to continue taking medication  with a goal of continued improvement in mental health.  She was discharged home with a plan to follow up as noted below.  Consults:  None  Significant Diagnostic Studies:  None  Discharge Vitals:   Blood pressure 115/72, pulse 108, temperature 97.7 F (36.5 C), temperature source Oral, resp. rate 18, height 5\' 7"  (1.702 m), weight 137.893 kg (304 lb). Body mass index is 47.6 kg/(m^2). Lab Results:   No results found for this or any previous visit (from the past 72 hour(s)).  Physical Findings: AIMS: Facial and Oral Movements Muscles of Facial Expression: None, normal Lips and Perioral Area: None, normal Jaw: None, normal Tongue: None, normal,Extremity Movements Upper (arms, wrists, hands, fingers): None, normal Lower (legs, knees, ankles, toes): None, normal, Trunk Movements Neck, shoulders, hips: None, normal, Overall Severity Severity of abnormal movements (highest score from questions above): None, normal Incapacitation due to abnormal movements: None, normal Patient's awareness of abnormal movements (rate only patient's report): No Awareness, Dental Status Current problems with teeth and/or dentures?: No Does patient usually wear dentures?: No  CIWA:    COWS:     Psychiatric Specialty Exam: See Psychiatric Specialty Exam and Suicide Risk Assessment completed by Attending Physician prior to discharge.  Discharge destination:  Home  Is patient on multiple antipsychotic therapies at discharge:  No   Has Patient had three or more failed trials of antipsychotic monotherapy by history:  NA  Recommended Plan for Multiple Antipsychotic Therapies: NA  Discharge Orders   Future Appointments Provider Department Dept Phone   03/29/2013 9:00 AM York Spaniel, MD [facility] [phone]   Future Orders Complete By Expires     Diet - low sodium heart healthy  As directed     Discharge instructions  As directed     Comments:      Take all of your medications  as directed. Be sure to keep all of your follow up appointments.  If you are unable to keep your follow up appointment, call your Doctor's office to let them know, and reschedule.  Make sure that you have enough medication to last until your appointment. Be sure to get plenty of rest. Going to bed at the same time each night will help. Try to avoid sleeping during the day.  Increase your activity as tolerated. Regular exercise will help you to sleep better and improve your mental health. Eating a heart healthy diet is recommended. Try to avoid salty or fried foods. Be sure to avoid all alcohol and illegal drugs.    Increase activity slowly  As directed         Medication List    STOP taking these medications       promethazine 25 MG tablet  Commonly known as:  PHENERGAN      TAKE these medications     Indication   amitriptyline 100 MG tablet  Commonly known as:  ELAVIL  Take 1 tablet (100 mg total) by mouth at bedtime.   Indication:  Depression     busPIRone 5 MG tablet  Commonly known as:  BUSPAR  Take 1 tablet (5 mg total) by mouth  3 (three) times daily.   Indication:  Anxiety Disorder, Symptoms of Feeling Anxious     COMBIGAN 0.2-0.5 % ophthalmic solution  Generic drug:  brimonidine-timolol  Place 1 drop into both eyes 2 (two) times daily.    For elevated IOP   FLUoxetine 40 MG capsule  Commonly known as:  PROZAC  Take 1 capsule (40 mg total) by mouth daily.   Indication:  Depression     gabapentin 600 MG tablet  Commonly known as:  NEURONTIN  Take 1 tablet (600 mg total) by mouth 3 (three) times daily.   Indication:  Agitation, Pain     hydrOXYzine 25 MG tablet  Commonly known as:  ATARAX/VISTARIL  Take 1 tablet (25 mg total) by mouth 3 (three) times daily.   Indication:  Anxiety Neurosis     lisinopril 40 MG tablet  Commonly known as:  PRINIVIL,ZESTRIL  Take 1 tablet (40 mg total) by mouth daily.   Indication:  High Blood Pressure     LORazepam 0.5 MG tablet   Commonly known as:  ATIVAN  Take 1 tablet (0.5 mg total) by mouth daily.   Indication:  Feeling Anxious, Anxiousness associated with Depression     omeprazole 40 MG capsule  Commonly known as:  PRILOSEC  Take 1 capsule (40 mg total) by mouth daily.   Indication:  Gastroesophageal Reflux Disease with Current Symptoms     oxyCODONE-acetaminophen 5-325 MG per tablet  Commonly known as:  PERCOCET/ROXICET  Take 2 tablets by mouth every 6 (six) hours as needed for pain.    for pain   topiramate 100 MG tablet  Commonly known as:  TOPAMAX  Take 1 tablet (100 mg total) by mouth daily.   Indication:  Migraine Headache     Travoprost (BAK Free) 0.004 % Soln ophthalmic solution  Commonly known as:  TRAVATAN  Place 1 drop into both eyes at bedtime.   For elevated IOP         Follow-up Information   Follow up with [facility] On 12/12/2012. (Appointment scheduled at 12:00 pm with Donnie Aho, NP for medication management)    Contact information:   [address], [address] [place], [place] [address] Phone: [phone] Fax: [phone]      Follow up with [facility] On 12/13/2012. (Appointment scheduled at 12:00 pm with Kathlene November for intake assessment for additional services)    Contact information:   [address], [address] [place], [place] [address] Phone: [phone] Fax: [phone]      Follow-up recommendations:   Activities: Resume activity as tolerated. Diet: Heart healthy low sodium diet Tests: Follow up testing will be determined by your out patient provider. Comments:    Total Discharge Time:  Less than 30 minutes.  Signed: Rona Ravens. [place]burn RPAC 10:54 AM 12/05/2012  Patient was seen and evaluated for suicide risk assessment, formulated discharge plans and case discussed with physician extender.Reviewed information documented and agree with the treatment plan.  Nehemiah Settle., MD 12/06/2012 12:43 PM

## 2012-12-05 NOTE — [facility] Suicide Risk Assessment (Signed)
[facility] INPATIENT:  Family/Significant Other Suicide Prevention Education  Suicide Prevention Education:  Education Completed; Brenda Odonnell - daughter [phone]),  (name of family member/significant other) has been identified by the patient as the family member/significant other with whom the patient will be residing, and identified as the person(s) who will aid the patient in the event of a mental health crisis (suicidal ideations/suicide attempt).  With written consent from the patient, the family member/significant other has been provided the following suicide prevention education, prior to the and/or following the discharge of the patient.  The suicide prevention education provided includes the following:  Suicide risk factors  Suicide prevention and interventions  National Suicide Hotline telephone number  [facility] assessment telephone number  [facility] Emergency Assistance 911  [facility] and/or Residential Mobile Crisis Unit telephone number  Request made of family/significant other to:  Remove weapons (e.g., guns, rifles, knives), all items previously/currently identified as safety concern.    Remove drugs/medications (over-the-counter, prescriptions, illicit drugs), all items previously/currently identified as a safety concern.  The family member/significant other verbalizes understanding of the suicide prevention education information provided.  The family member/significant other agrees to remove the items of safety concern listed above.  Brenda Odonnell 12/05/2012, 11:21 AM

## 2012-12-05 NOTE — Progress Notes (Signed)
D: Patient denies SI/HI and A/V hallucinations; patient reports sleep is well; reports appetite is good ; reports energy level is high ; reports ability to pay attention is good; rates depression as 2/10; rates hopelessness 1/10; rates anxiety as 2/10;   A: Monitored q 15 minutes; patient encouraged to attend groups; patient educated about medications; patient given medications per physician orders; patient encouraged to express feelings and/or concerns  R: Patient is animated and bright; patient is smiling and making jokes; patient's interaction with staff and peers is appropriate; patient is taking medications as prescribed and tolerating medications; patient is attending all groups

## 2012-12-05 NOTE — [facility] Suicide Risk Assessment (Signed)
Suicide Risk Assessment  Discharge Assessment     Demographic Factors:  Adolescent or young adult, Low socioeconomic status, Living alone and Unemployed  Mental Status Per Nursing Assessment::   On Admission:     Current Mental Status by Physician: Mental Status Examination: Patient appeared as per his stated age, casually dressed, and fairly groomed, and maintaining good eye contact. Patient has good mood and her affect was appropriate and bright. She has normal rate, rhythm, and volume of speech. Her thought process is linear and goal directed. Patient has denied suicidal, homicidal ideations, intentions or plans. Patient has no evidence of auditory or visual hallucinations, delusions, and paranoia. Patient has fair insight judgment and impulse control.  Loss Factors: Decline in physical health and Financial problems/change in socioeconomic status  Historical Factors: NA  Risk Reduction Factors:   Sense of responsibility to family, Religious beliefs about death, Positive social support, Positive therapeutic relationship and Positive coping skills or problem solving skills  Continued Clinical Symptoms:  Severe Anxiety and/or Agitation Depression:   Recent sense of peace/wellbeing Previous Psychiatric Diagnoses and Treatments Medical Diagnoses and Treatments/Surgeries  Cognitive Features That Contribute To Risk:  Polarized thinking    Suicide Risk:  Minimal: No identifiable suicidal ideation.  Patients presenting with no risk factors but with morbid ruminations; may be classified as minimal risk based on the severity of the depressive symptoms  Discharge Diagnoses:   AXIS I:  Major Depression, Recurrent severe AXIS II:  Deferred AXIS III:   Past Medical History  Diagnosis Date  . Sinus infection   . Hypertension   . Arthritis   . Spinal stenosis   . Glaucoma   . Carpal tunnel syndrome   . Depression   . Bipolar 2 disorder   . Obesity   . Arthritis   . Glaucoma   .  Leiomyoma of uterus, unspecified    AXIS IV:  other psychosocial or environmental problems, problems related to social environment and problems with primary support group AXIS V:  61-70 mild symptoms  Plan Of Care/Follow-up recommendations:  Activity:  As tolerated Diet:  Regular  Is patient on multiple antipsychotic therapies at discharge:  No   Has Patient had three or more failed trials of antipsychotic monotherapy by history:  No  Recommended Plan for Multiple Antipsychotic Therapies: Not applicable  Nehemiah Settle., M.D. 12/05/2012, 10:38 AM

## 2012-12-05 NOTE — Progress Notes (Signed)
Patient has been up and active on the unit. She is compliant with her medications and reports a friend from school visited her on today. Patient c/o pain and requested her pain med which she received. Will monitor effectiveness of medication given. Patient currently denies having pain, -si/hi/a/v hall. Support and encouragement offered, safety maintained on unit, will continue to monitor.

## 2012-12-05 NOTE — Progress Notes (Signed)
[name], female   DOB: [birth year], 47 y.o.   MRN: [id number] Patient discharged per physician order; patient denies SI/HI and A/V hallucinations; patient received samples, prescriptions, copy of AVS, after it was reviewed; patient personal medication supply was returned to the patient prior to admission and that was verbalized by patient and daughter who signed the medication had been returned; patient has no other questions or concerns at this time; patient left the unit ambulatory with daughter

## 2012-12-05 NOTE — Tx Team (Signed)
Interdisciplinary Treatment Plan Update (Adult)  Date: 12/05/2012  Time Reviewed:  9:45 AM  Progress in Treatment: Attending groups: Yes Participating in groups:  Yes Taking medication as prescribed:  Yes Tolerating medication:  Yes Family/Significant othe contact made: Yes Patient understands diagnosis:  Yes Discussing patient identified problems/goals with staff:  Yes Medical problems stabilized or resolved:  Yes Denies suicidal/homicidal ideation: Yes Issues/concerns per patient self-inventory:  Yes Other:  New problem(s) identified: N/A  Discharge Plan or Barriers: Pt will follow up at [facility] for medication management and therapy.  Reason for Continuation of Hospitalization: Stable to d/c  Comments: N/A  Estimated length of stay: D/C today  For review of initial/current patient goals, please see plan of care.  Attendees: Patient:  Brenda Odonnell  12/05/2012 11:08 AM  Family:     Physician:  Dr. Javier Glazier 12/05/2012 11:08 AM   Nursing:   Burnetta Sabin, RN 12/05/2012 11:08 AM   Clinical Social Worker:  Reyes Ivan, LCSWA 12/05/2012 11:08 AM   Other: Verne Spurr, PA  12/05/2012 11:08 AM   Other:  Frankey Shown, MA care coordination 12/05/2012 11:08 AM   Other:  Quintella Reichert, RN 12/05/2012 11:08 AM   Other:  Neill Loft, RN 12/05/2012 11:08 AM  Other:    Other:    Other:    Other:    Other:    Other:     Scribe for Treatment Team:   Carmina Miller, 12/05/2012 11:08 AM

## 2012-12-07 ENCOUNTER — Encounter: Payer: Self-pay | Admitting: Nurse Practitioner

## 2012-12-07 ENCOUNTER — Ambulatory Visit (INDEPENDENT_AMBULATORY_CARE_PROVIDER_SITE_OTHER): Payer: Medicaid Other | Admitting: Nurse Practitioner

## 2012-12-07 VITALS — BP 134/82 | HR 83 | Ht 66.0 in | Wt 320.0 lb

## 2012-12-07 DIAGNOSIS — R404 Transient alteration of awareness: Secondary | ICD-10-CM

## 2012-12-07 DIAGNOSIS — R400 Somnolence: Secondary | ICD-10-CM

## 2012-12-07 DIAGNOSIS — R42 Dizziness and giddiness: Secondary | ICD-10-CM

## 2012-12-07 DIAGNOSIS — R51 Headache: Secondary | ICD-10-CM

## 2012-12-07 NOTE — Progress Notes (Signed)
I have read the note, and I agree with the clinical assessment and plan.  

## 2012-12-07 NOTE — Patient Instructions (Addendum)
Will set up for sleep study Continue Topamax at 100 mg Given list of migraine triggers FU after sleep study

## 2012-12-07 NOTE — Progress Notes (Signed)
HPI: -[name], 47 yr old black female returns for followup. She was last seen by Dr. Sandria Manly 06/08/2012 for  left sided neck pain, headache, and left hand and arm pain. Previously seen 11/10/08 for bifrontal headaches associated with blurred vision, nausea, and vomiting with a history of loss of sense of smell for 6 years. Examination was normal. MRI study of the brain with and without contrast limited to the pituitary 11/05/2008 was normal. She had a  history of pain in the right side of her neck followed by Dr. Magnus Ivan, orthopedist and also bilateral hand numbness. EMG/NCV 02/03/2010 showed moderate bilateral median neuropathies and cervical MRI by Dr. 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 She was treated with wrist splint  therapy to her hands and underwent carpal tunnel release to the left 04/09/2011. 05/18/2012 she awoke with left-sided headache and reawakened with a sharp pain in her neck and back of her head on the left, pain with her shoulder and into her left arm. There was numbness in her left hand involving the  thumb, index, and third finger. She arrived in the emergency room and her symptoms resolved initially, but have now recurred for approximately 3 weeks. CT scan of the brain showed no acute abnormalities and MRI of the head contrast and intracranial MRA a were normal without acute stroke 05/19/2012. 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 She has. discomfort in her neck when she turns her head improvement in flexing her head forward. She has a prior history of right-sided neck pain into her right arm with tingling in the right hand followed by Drs. Hilda Lias and Butler with epidural steroid injections  Cervical myelogram 11/17/2012 shows significant spinal stenosis C5-C6. There is a calcified osteophyte which compresses the cord and  results in spinal stenosis measuring 6 mm sagittal diameter. Shallow central protrusion C4-C5 with cord flattening canal diameter 7 mm. No definite C5 nerve root impingement. This was ordered by Dr. Jeral Fruit.  She has noted bladder urgency , some incontinence but no bowel incontinence. Neck pain is made worse by coughing sneezing. She has weight gain from the use of epidural steroids. She has kept a headache diary and have a headache on awakening about every other day. She snores at night, she does not feel rested on awakening. She is morbidly obese. Patient had recent admission to behavioral health inpatient after overdose of Ativan.   ROS: Fatigue, blurred vision, urinary incontinence/urgency, joint pain, achy muscles, memory loss, confusion, headache, numbness, weakness, depression and anxiety, insomnia   Physical Exam General: well developed, morbidly obese  seated, in no evident distress Head: head normocephalic and atraumatic. Oropharynx benign Neck: supple with no carotid  bruits Cardiovascular: regular rate and rhythm, no murmurs  Neurologic Exam Mental Status: Awake and fully alert. Oriented to place and time. Follows all commands. Speech and language normal.  ESS 14, BMI 51.65, neck 17.5 inches Cranial Nerves: Fundoscopic exam reveals flat disc margins. Pupils equal, briskly reactive to light. Extraocular movements full without nystagmus. Visual fields full to confrontation. Hearing intact and symmetric to finger snap. Facial sensation intact. Face, tongue, palate move normally and symmetrically. Neck flexion and extension normal.  Motor: Normal bulk and tone. Normal strength in all tested extremity muscles.No focal weakness Sensory.: intact to touch and pinprick and vibratory.  Coordination: Rapid alternating movements normal in all extremities. Finger-to-nose  performed accurately  bilaterally. Gait and Station: Arises from chair without difficulty. Stance is wide based.  Able to heel, toe  and unsteady with tandem walk.  Reflexes: 2+ and symmetric. Toes downgoing.     ASSESSMENT: Significant spinal stenosis C5-6 per cervical myelogram, followed by Dr. Jeral Fruit History of migraine, headaches on awakening, daytime drowsiness.ESS 14. BMI 51.65. Morbid obesity Maj. depression with recent inpatient admission for overdose of Ativan   PLAN: Will set up for sleep study Continue Topamax at 100 mg for headaches Given list of migraine triggers, discussed other factors and foods FU after sleep study  Brenda Odonnell, GNP-BC APRN

## 2012-12-08 NOTE — Progress Notes (Signed)
Patient Discharge Instructions:  After Visit Summary (AVS):   Faxed to:  12/08/12 Discharge Summary Note:   Faxed to:  12/08/12 Psychiatric Admission Assessment Note:   Faxed to:  12/08/12 Suicide Risk Assessment - Discharge Assessment:   Faxed to:  12/08/12 Faxed/Sent to the [facility] Care provider:  12/08/12 Faxed to [facility] @ [phone]  Jerelene Redden, 12/08/2012, 2:29 PM

## 2013-01-06 ENCOUNTER — Telehealth: Payer: Self-pay | Admitting: Neurology

## 2013-01-06 DIAGNOSIS — G4733 Obstructive sleep apnea (adult) (pediatric): Secondary | ICD-10-CM

## 2013-01-06 NOTE — Telephone Encounter (Signed)
I entered a split-night study request in this patient, thank you s

## 2013-01-06 NOTE — Telephone Encounter (Signed)
. °  Darrol Angel, NP is referring Brenda Odonnell, 47 y.o. female, for the evaluation of sleep apnea.  Wt: 320 lbs. Ht: 66 in. BMI: 51.67  Diagnoses: Super Morbid Obesity Excessive Daytime Sleepiness Snoring Witnessed Apneas Fatigue Headache Hypertension  Major Depression    Medication List: Current Outpatient Prescriptions  Medication Sig Dispense Refill   amitriptyline (ELAVIL) 100 MG tablet Take 1 tablet (100 mg total) by mouth at bedtime.  30 tablet  0   brimonidine-timolol (COMBIGAN) 0.2-0.5 % ophthalmic solution Place 1 drop into both eyes 2 (two) times daily.       busPIRone (BUSPAR) 5 MG tablet Take 1 tablet (5 mg total) by mouth 3 (three) times daily.  90 tablet  0   FLUoxetine (PROZAC) 40 MG capsule Take 1 capsule (40 mg total) by mouth daily.  30 capsule  0   gabapentin (NEURONTIN) 600 MG tablet Take 1 tablet (600 mg total) by mouth 3 (three) times daily.  90 tablet  0   hydrOXYzine (ATARAX/VISTARIL) 25 MG tablet Take 1 tablet (25 mg total) by mouth 3 (three) times daily.  90 tablet  0   lisinopril (PRINIVIL,ZESTRIL) 40 MG tablet Take 1 tablet (40 mg total) by mouth daily.       LORazepam (ATIVAN) 0.5 MG tablet Take 1 tablet (0.5 mg total) by mouth daily.  30 tablet  0   omeprazole (PRILOSEC) 40 MG capsule Take 1 capsule (40 mg total) by mouth daily.       oxyCODONE-acetaminophen (PERCOCET) 10-325 MG per tablet Take 1 tablet by mouth 3 (three) times daily.       topiramate (TOPAMAX) 100 MG tablet Take 1 tablet (100 mg total) by mouth daily.  30 tablet  0   Travoprost, BAK Free, (TRAVATAN) 0.004 % SOLN ophthalmic solution Place 1 drop into both eyes at bedtime.       No current facility-administered medications for this visit.    This patient presents to Darrol Angel, NP with a primary complaint of headache/migraine upon awakening and excessive daytime sleepiness.  She endorsed [facility] at 14.  The patient is super morbidly obese with a bmi of > 51.  She  reports having apneic events that awake her from sleep.  Snoring is noted to be thunderous.  Pt has hypertension and chronic daytime fatigue.  Needs attended study to rule out osa.   Insurance:  MEDICAID - Prior approval is not required.

## 2013-01-17 ENCOUNTER — Ambulatory Visit (INDEPENDENT_AMBULATORY_CARE_PROVIDER_SITE_OTHER): Payer: Medicaid Other | Admitting: Neurology

## 2013-01-17 DIAGNOSIS — G4761 Periodic limb movement disorder: Secondary | ICD-10-CM

## 2013-01-17 DIAGNOSIS — G4733 Obstructive sleep apnea (adult) (pediatric): Secondary | ICD-10-CM

## 2013-01-17 DIAGNOSIS — G4710 Hypersomnia, unspecified: Secondary | ICD-10-CM

## 2013-01-18 ENCOUNTER — Other Ambulatory Visit: Payer: Self-pay | Admitting: Obstetrics

## 2013-01-19 ENCOUNTER — Telehealth: Payer: Self-pay | Admitting: Neurology

## 2013-01-24 NOTE — Telephone Encounter (Signed)
Patient called [facility] with request to schedule her appointment regarding sleep study results.  Returned call and advised her that her sleep study results had not been finalized.  Explained that she would be contacted as soon as the final report has been completed by Dr. Frances Furbish.  She does indicate that her insurance will terminate at the end of the month.  She is requesting a rush process to ensure that all appt's or applicable equipment to be ordered, can be completed on or before 01/31/2013.  Advised patient that we will do all that we can for her, but did point out the short notice that she had given [place] regarding her insurance.

## 2013-01-25 ENCOUNTER — Telehealth: Payer: Self-pay | Admitting: Neurology

## 2013-01-25 DIAGNOSIS — G4733 Obstructive sleep apnea (adult) (pediatric): Secondary | ICD-10-CM

## 2013-01-25 NOTE — Telephone Encounter (Signed)
Please call and notify the patient that the recent sleep study did confirm the diagnosis of obstructive sleep apnea and that I recommend treatment for this in the form of CPAP. This will require a repeat sleep study for proper titration and mask fitting. Please explain to patient and arrange for a CPAP titration study. I have placed an order in the chart. While overall her OSA is considered mild, findings are severe in dream sleep and because her O2 sats drop to 77% in REM sleep and in light of her Hx of recurrent HAs, treatment with CPAP may be beneficial. Please arrange ASAP as there is insurance restriction.  Thanks, Huston Foley, MD, PhD [facility])

## 2013-01-26 ENCOUNTER — Encounter: Payer: Self-pay | Admitting: *Deleted

## 2013-01-26 NOTE — Telephone Encounter (Signed)
Called patient to discuss sleep study results.  Discussed findings, recommendations and follow up care.  Patient understood well and all questions were answered.   Explained the need for follow up titration.  She explains she is losing her Medicaid at the end of the month.  Her case worker has helped her apply for disability and she should hear back in 90 days if that has been approved.  She is anxious to get some help.  We went ahead and scheduled her CPAP Titration for Saturday 01/28/13 so she can get that part taken care of before the month ends.  Depending upon Dr. Teofilo Pod recommendation to continue with a home trial of CPAP, I explained to patient that we have some CPAP machines that have been donated to [place] from patients (their old machine that they got replaced with a new one for instance) that we can donate to someone in need.  She is welcome to use one of these machines if we can match one to her.  We also discussed other options such as refurbished equipment, or used equipment from other sources.  She is willing to go ahead with this. She will plan to arrive on Saturday at 7 PM.  She understands she doesn't necessarily have to fall asleep that early if she isn't ready to go to sleep yet.  She will also come in on Friday to discuss sleep study results with Dr. Frances Furbish while she is still covered.  I will leave a copy of her report with Dr. Teofilo Pod clinic assistant to give to the patient. -sh

## 2013-01-27 ENCOUNTER — Encounter: Payer: Self-pay | Admitting: Neurology

## 2013-01-27 ENCOUNTER — Ambulatory Visit (INDEPENDENT_AMBULATORY_CARE_PROVIDER_SITE_OTHER): Payer: Medicaid Other | Admitting: Neurology

## 2013-01-27 VITALS — BP 127/86 | HR 90 | Temp 98.2°F | Ht 66.0 in | Wt 329.0 lb

## 2013-01-27 DIAGNOSIS — R51 Headache: Secondary | ICD-10-CM

## 2013-01-27 DIAGNOSIS — G4733 Obstructive sleep apnea (adult) (pediatric): Secondary | ICD-10-CM

## 2013-01-27 MED ORDER — TOPIRAMATE 100 MG PO TABS: 100.0000 mg | ORAL_TABLET | Freq: Every day | ORAL | Status: DC

## 2013-01-27 NOTE — Patient Instructions (Signed)
Keep your appointment tomorrow night for CPAP titration. I think you will benefit from treatment of your obstructive sleep apnea.

## 2013-01-27 NOTE — Progress Notes (Signed)
Subjective:    [name] is a 47 y.o. female.  HPI  Brenda Foley, MD, PhD [facility] [address], [address] [address] Brenda Odonnell, [place] [address]  Dear Brenda Odonnell,   I saw Brenda Odonnell upon your kind request, for initial consultation of her sleep disorder, after a recent sleep study. She is unaccompanied today. As you know, Brenda Odonnell is a very pleasant 47 year old right-handed woman with an underlying medical history of morbid obesity, depression, history of overdose with a recent admission d/t overdose on benzodiazepine, recurrent headaches, history of cervical spinal stenosis, who had a sleep study on 01/17/2013 because of concern for structures sleep apnea. She reported snoring and excessive daytime somnolence.. I went over her test results with her in detail today. Her sleep efficiency was normal at 91.6% with a latency to sleep of 10.5 minutes. Wake after sleep onset was 25 minutes with moderate sleep fragmentation noted. She had a normal percentage of stage I sleep, an increased percentage of stage II sleep, absence of slow-wave sleep and a decreased percentage of REM sleep at 15.3% with a prolonged REM latency. She had mild periodic leg movements at 9.6 per hour with an associated arousal index of 3.1 per hour. She had intermittent mild to moderate snoring. She had an overall AHI of 10.5 per hour, rising to 42.4 per hour and REM sleep. Her baseline oxygen saturation was 94% and her nadir and REM sleep was 77%. She spent 11 minutes and 36 seconds below the saturation of 90% for the night. She was pursuing weight loss surgery, but her insurance is going to expire and her spinal stenosis was diagnosed in the interim. She saw Dr. Jeral Odonnell.   Her typical bedtime is reported to be around 11 PM to MN and usual wake time is around 7:30 to 10 AM. Sleep onset typically occurs within 1-2 hours. She reports feeling poorly rested upon awakening. She wakes up on an  average 3 times in the middle of the night and has to go to the bathroom 3 times on a typical night. She reports frequent morning headaches.  She reports excessive daytime somnolence (EDS) and Her Epworth Sleepiness Score (ESS) is 11/24 today. She has not fallen asleep while driving. The patient has been taking a unscheduled naps.   She has been known to snore for the past many years. Snoring is reportedly marked, and associated with choking sounds and witnessed apneas. The patient admits to a sense of choking or strangling feeling. There is no report of nighttime reflux, with no nighttime cough experienced. The patient has noted mild to moderate RLS symptoms and is known to kick while asleep or before falling asleep. There is no family history of RLS or OSA.  She is a restless sleeper and in the morning, the bed is quite disheveled.   She denies cataplexy, sleep paralysis, hypnagogic or hypnopompic hallucinations, or sleep attacks. She does not report any vivid dreams, nightmares, dream enactments, or parasomnias, such as sleep talking or sleep walking. She consumes no caffeinated beverages on any given day.  Her bedroom is usually dark and cool. There is a TV in the bedroom and usually it is not on at night.   Her Past Medical History Is Significant For: Past Medical History  Diagnosis Date  . Sinus infection   . Hypertension   . Arthritis   . Spinal stenosis   . Glaucoma   . Carpal tunnel syndrome   . Depression   .  Bipolar 2 disorder   . Obesity   . Arthritis   . Glaucoma   . Leiomyoma of uterus, unspecified     Her Past Surgical History Is Significant For: Past Surgical History  Procedure Laterality Date  . Carpal tunnel release      Left Hand  . Abdominal hysterectomy    . Knee surgery Left   . Myelogram      Her Family History Is Significant For: Family History  Problem Relation Age of Onset  . Hypertension      Her Social History Is Significant For: History   Social  History  . Marital Status: Married    Spouse Name: Brenda Odonnell     Number of Children: 3  . Years of Education: N/A   Occupational History  .     Social History Main Topics  . Smoking status: Never Smoker   . Smokeless tobacco: Never Used  . Alcohol Use: No  . Drug Use: No  . Sexual Activity: Not Currently    Partners: Male    Birth Control/ Protection: Surgical   Other Topics Concern  . None   Social History Narrative   Patient is married to Brenda Odonnell.    Patient has 3 children.    Patient denies caffeine.           Her Allergies Are:  Allergies  Allergen Reactions  . Ibuprofen Nausea And Vomiting    Due to gird  :   Her Current Medications Are:  Outpatient Encounter Prescriptions as of 01/27/2013  Medication Sig Dispense Refill  . amitriptyline (ELAVIL) 100 MG tablet Take 1 tablet (100 mg total) by mouth at bedtime.  30 tablet  0  . brimonidine-timolol (COMBIGAN) 0.2-0.5 % ophthalmic solution Place 1 drop into both eyes 2 (two) times daily.      . busPIRone (BUSPAR) 5 MG tablet Take 1 tablet (5 mg total) by mouth 3 (three) times daily.  90 tablet  0  . FLUoxetine (PROZAC) 40 MG capsule Take 1 capsule (40 mg total) by mouth daily.  30 capsule  0  . gabapentin (NEURONTIN) 600 MG tablet Take 1 tablet (600 mg total) by mouth 3 (three) times daily.  90 tablet  0  . hydrOXYzine (ATARAX/VISTARIL) 25 MG tablet Take 1 tablet (25 mg total) by mouth 3 (three) times daily.  90 tablet  0  . lisinopril (PRINIVIL,ZESTRIL) 40 MG tablet Take 1 tablet (40 mg total) by mouth daily.      Marland Kitchen LORazepam (ATIVAN) 0.5 MG tablet Take 1 tablet (0.5 mg total) by mouth daily.  30 tablet  0  . omeprazole (PRILOSEC) 40 MG capsule Take 1 capsule (40 mg total) by mouth daily.      Marland Kitchen oxyCODONE-acetaminophen (PERCOCET) 10-325 MG per tablet Take 1 tablet by mouth 3 (three) times daily.      Marland Kitchen topiramate (TOPAMAX) 100 MG tablet Take 1 tablet (100 mg total) by mouth daily.  30 tablet  0  . Travoprost,  BAK Free, (TRAVATAN) 0.004 % SOLN ophthalmic solution Place 1 drop into both eyes at bedtime.      . fluconazole (DIFLUCAN) 150 MG tablet take 1 tablet by mouth as a single dose  1 tablet  0   No facility-administered encounter medications on file as of 01/27/2013.  :  Review of Systems:  Out of a complete 14 point review of systems, all are reviewed and negative with the exception of these symptoms as listed below:  Review of Systems  Constitutional: Positive for fatigue.  HENT: Positive for trouble swallowing.   Respiratory: Positive for shortness of breath.        Snoring  Cardiovascular: Positive for leg swelling.  Gastrointestinal:       Incontinence  Neurological: Positive for weakness, numbness and headaches.       Memory loss   Psychiatric/Behavioral: Positive for confusion, sleep disturbance, dysphoric mood and agitation (Racing thoughts). The patient is nervous/anxious.     Objective:  Neurologic Exam  Physical Exam Physical Examination:   Filed Vitals:   01/27/13 1023  BP: 127/86  Pulse: 90  Temp: 98.2 F (36.8 C)    General Examination: The patient is a very pleasant 47 y.o. female in no acute distress. She appears well-developed and well-nourished and adequately groomed. She is obese.   HEENT: Normocephalic, atraumatic, pupils are equal, round and reactive to light and accommodation. Extraocular tracking is good without limitation to gaze excursion or nystagmus noted. Normal smooth pursuit is noted. Hearing is grossly intact. Face is symmetric with normal facial animation and normal facial sensation. Speech is clear with no dysarthria noted. There is no hypophonia. There is no lip, neck/head, jaw or voice tremor. Neck is supple with full range of passive and active motion. There are no carotid bruits on auscultation. Oropharynx exam reveals: mild mouth dryness, adequate dental hygiene and marked airway crowding, due to large tongue, large uvula and tonsillar size of  2+. Mallampati is class II. Tongue protrudes centrally and palate elevates symmetrically. Tonsils are 2+ in size. Neck size is 18 7/8 inches.   Chest: Clear to auscultation without wheezing, rhonchi or crackles noted.  Heart: S1+S2+0, regular and normal without murmurs, rubs or gallops noted.   Abdomen: Soft, non-tender and non-distended with normal bowel sounds appreciated on auscultation.  Extremities: There is 1+ pitting edema in the distal lower extremities bilaterally. Pedal pulses are intact.  Skin: Warm and dry without trophic changes noted. There are no varicose veins.  Musculoskeletal: exam reveals no obvious joint deformities, tenderness or joint swelling or erythema.   Neurologically:  Mental status: The patient is awake, alert and oriented in all 4 spheres. Her memory, attention, language and knowledge are appropriate. There is no aphasia, agnosia, apraxia or anomia. Speech is clear with normal prosody and enunciation. Thought process is linear. Mood is congruent and affect is normal.  Cranial nerves are as described above under HEENT exam. In addition, shoulder shrug is normal with equal shoulder height noted. Motor exam: Normal bulk, strength and tone is noted. There is no drift, tremor or rebound. Romberg is negative. Reflexes are 1+ in the upper extremities and absent in both knees, and 1+ in both ankles. Fine motor skills are intact. Cerebellar testing shows no dysmetria or intention tremor on finger to nose testing. There is no truncal or gait ataxia.  Sensory exam is intact to light touch, pinprick, vibration, temperature sense and proprioception in the upper and lower extremities.  Gait, station and balance: She stands up with difficulty and has to push herself up. She walks with a cane with her slowness. Her stance is wide-based. Posture is mildly stooped for age. She's not able to do tandem walk. She's not able to do toe stance. Balance is mildly impaired.  Assessment and  Plan:   In summary, Brenda Odonnell is a very pleasant 47 y.o.-female with a history and physical exam consistent with obstructive sleep apnea (OSA) and recent confirmation be a sleep study. I talked to her at length  about her symptoms, my findings, and her diagnosis of OSA, its prognosis and treatment options. We talked about medical treatments and non-pharmacological approaches. I explained in particular the risks and ramifications of untreated moderate to severe OSA, especially with respect to developing cardiovascular disease down the Road, including congestive heart failure, difficult to treat hypertension, cardiac arrhythmias, or stroke. Even type 2 diabetes has in part been linked to untreated OSA. We talked about trying to maintain a healthy lifestyle in general, as well as the importance of weight control. I encouraged the patient to eat healthy, exercise daily and keep well hydrated, to keep a scheduled bedtime and wake time routine, to not skip any meals and eat healthy snacks in between meals. I think she is going to benefit from treatment with regards to her daytime somnolence, her recurrent headaches, and her nocturia as well. 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 We can embark on treatment for her PLMS and RLS down the Road. I would do this after she has been on CPAP treatment for sleep apnea. She is willing to try CPAP. She is scheduled for a CPAP titration study tomorrow night. Based on her test results I will prescribe the CPAP unit for her and she indicates that she would be willing to use CPAP. I explained the importance of being compliant with PAP treatment, not only for insurance purposes but primarily to improve Her symptoms, and for the patient's long term health benefit, including to reduce Her cardiovascular risks. She is encouraged to make a followup appointment with you for management of her headaches. She requested a refill on her  Topamax which I provided today. I answered all her questions today and the patient was in agreement. I would like to see her back after the sleep study is completed and after she has had a chance to try CPAP at home.  Thank you very much for allowing me to participate in the care of this patient.   Sincerely,   Brenda Foley, MD, PhD

## 2013-03-29 ENCOUNTER — Ambulatory Visit: Payer: Self-pay | Admitting: Neurology

## 2013-04-19 ENCOUNTER — Telehealth: Payer: Self-pay | Admitting: Neurology

## 2013-04-21 ENCOUNTER — Ambulatory Visit: Payer: Self-pay | Admitting: Neurology

## 2013-04-28 ENCOUNTER — Ambulatory Visit (INDEPENDENT_AMBULATORY_CARE_PROVIDER_SITE_OTHER): Payer: Medicaid Other

## 2013-04-28 DIAGNOSIS — G4733 Obstructive sleep apnea (adult) (pediatric): Secondary | ICD-10-CM

## 2013-04-28 DIAGNOSIS — G4761 Periodic limb movement disorder: Secondary | ICD-10-CM

## 2013-04-28 DIAGNOSIS — G4710 Hypersomnia, unspecified: Secondary | ICD-10-CM

## 2013-04-28 DIAGNOSIS — G479 Sleep disorder, unspecified: Secondary | ICD-10-CM

## 2013-05-12 ENCOUNTER — Telehealth: Payer: Self-pay | Admitting: Neurology

## 2013-05-12 DIAGNOSIS — G4733 Obstructive sleep apnea (adult) (pediatric): Secondary | ICD-10-CM

## 2013-05-12 NOTE — Telephone Encounter (Signed)
Please call and inform patient that I have entered an order for treatment with PAP. She did well during the latest sleep study with CPAP. We will, therefore, arrange for a machine for home use through a DME (durable medical equipment) company of Her choice; and I will see the patient back in follow-up in about 6 weeks. Please also explain to the patient that I will be looking out for compliance data downloaded from the machine, which can be done remotely through a modem at times or stored on an SD card in the back of the machine. At the time of the followup appointment we will discuss sleep study results and how it is going with PAP treatment at home. Please advise patient to bring Her machine at the time of the visit; at least for the first visit, even though this is cumbersome. Bringing the machine for every visit after that may not be needed, but often helps for the first visit. Please also make sure, the patient has a follow-up appointment with me in about 6 weeks from the setup date, thanks.   Tandrea Kommer, MD, PhD [facility] ([facility]  

## 2013-05-15 ENCOUNTER — Other Ambulatory Visit: Payer: Self-pay | Admitting: *Deleted

## 2013-05-15 ENCOUNTER — Encounter: Payer: Self-pay | Admitting: *Deleted

## 2013-05-15 DIAGNOSIS — G4733 Obstructive sleep apnea (adult) (pediatric): Secondary | ICD-10-CM

## 2013-05-15 NOTE — Telephone Encounter (Signed)
I called and spoke with the patient about her recent sleep study results. I informed the patient that Dr. Rexene Alberts recommend CPAP therapy at home because she did well on CPAP during the night of her study. I also informed the patient that I will send her order to [place] and she'll be contact once they have received benefits for the machine.  I will send a copy of the report to Cecille Rubin, NP and Dr. Wynelle Link Hassan's office and mail her a copy of the report along with a follow up instruction letter for her six weeks post visit with Dr. Rexene Alberts.

## 2013-05-27 ENCOUNTER — Encounter (HOSPITAL_BASED_OUTPATIENT_CLINIC_OR_DEPARTMENT_OTHER): Payer: Self-pay | Admitting: Emergency Medicine

## 2013-05-27 ENCOUNTER — Emergency Department (HOSPITAL_BASED_OUTPATIENT_CLINIC_OR_DEPARTMENT_OTHER)
Admission: EM | Admit: 2013-05-27 | Discharge: 2013-05-27 | Disposition: A | Discharge status: Home / Self Care | Payer: Medicaid Other | Attending: Emergency Medicine | Admitting: Emergency Medicine

## 2013-05-27 DIAGNOSIS — G43909 Migraine, unspecified, not intractable, without status migrainosus: Secondary | ICD-10-CM | POA: Insufficient documentation

## 2013-05-27 DIAGNOSIS — Z8709 Personal history of other diseases of the respiratory system: Secondary | ICD-10-CM | POA: Insufficient documentation

## 2013-05-27 DIAGNOSIS — M542 Cervicalgia: Secondary | ICD-10-CM | POA: Insufficient documentation

## 2013-05-27 DIAGNOSIS — Z8742 Personal history of other diseases of the female genital tract: Secondary | ICD-10-CM | POA: Insufficient documentation

## 2013-05-27 DIAGNOSIS — M129 Arthropathy, unspecified: Secondary | ICD-10-CM | POA: Insufficient documentation

## 2013-05-27 DIAGNOSIS — Z79899 Other long term (current) drug therapy: Secondary | ICD-10-CM | POA: Insufficient documentation

## 2013-05-27 DIAGNOSIS — F3189 Other bipolar disorder: Secondary | ICD-10-CM | POA: Insufficient documentation

## 2013-05-27 DIAGNOSIS — I10 Essential (primary) hypertension: Secondary | ICD-10-CM | POA: Insufficient documentation

## 2013-05-27 DIAGNOSIS — E669 Obesity, unspecified: Secondary | ICD-10-CM | POA: Insufficient documentation

## 2013-05-27 DIAGNOSIS — M549 Dorsalgia, unspecified: Secondary | ICD-10-CM | POA: Insufficient documentation

## 2013-05-27 DIAGNOSIS — H40009 Preglaucoma, unspecified, unspecified eye: Secondary | ICD-10-CM | POA: Insufficient documentation

## 2013-05-27 HISTORY — DX: Sleep apnea, unspecified: G47.30

## 2013-05-27 MED ORDER — DEXAMETHASONE SODIUM PHOSPHATE 10 MG/ML IJ SOLN
10.0000 mg | Freq: Once | INTRAMUSCULAR | Status: AC
  Administered 2013-05-27: 10 mg via INTRAVENOUS
  Filled 2013-05-27: qty 1

## 2013-05-27 MED ORDER — SODIUM CHLORIDE 0.9 % IV BOLUS (SEPSIS)
1000.0000 mL | Freq: Once | INTRAVENOUS | Status: AC
  Administered 2013-05-27: 1000 mL via INTRAVENOUS

## 2013-05-27 MED ORDER — DIPHENHYDRAMINE HCL 50 MG/ML IJ SOLN
25.0000 mg | Freq: Once | INTRAMUSCULAR | Status: AC
  Administered 2013-05-27: 25 mg via INTRAVENOUS
  Filled 2013-05-27: qty 1

## 2013-05-27 MED ORDER — HYDROMORPHONE HCL PF 1 MG/ML IJ SOLN
1.0000 mg | Freq: Once | INTRAMUSCULAR | Status: AC
  Administered 2013-05-27: 1 mg via INTRAVENOUS
  Filled 2013-05-27: qty 1

## 2013-05-27 MED ORDER — PROMETHAZINE HCL 25 MG/ML IJ SOLN
12.5000 mg | Freq: Once | INTRAMUSCULAR | Status: AC
  Administered 2013-05-27: 12.5 mg via INTRAVENOUS
  Filled 2013-05-27: qty 1

## 2013-05-27 MED ORDER — SODIUM CHLORIDE 0.9 % IV SOLN: INTRAVENOUS | Status: DC

## 2013-05-27 MED ORDER — PROMETHAZINE HCL 25 MG PO TABS: 25.0000 mg | ORAL_TABLET | Freq: Four times a day (QID) | ORAL | Status: DC | PRN

## 2013-05-27 NOTE — ED Provider Notes (Signed)
CSN: [id number]     Arrival date & time 05/27/13  1757 History  This chart was scribed for Mervin Kung, MD by Eugenia Mcalpine, ED Scribe. This patient was seen in room MH09/MH09 and the patient's care was started at 8:19 PM.     Chief Complaint  Patient presents with  . Migraine   (Consider location/radiation/quality/duration/timing/severity/associated sxs/prior Treatment) Patient is a 48 y.o. female presenting with migraines. The history is provided by the patient. No language interpreter was used.  Migraine Associated symptoms include headaches. Pertinent negatives include no chest pain, no abdominal pain and no shortness of breath.   HPI Comments: Brenda Odonnell is a 48 y.o. female hx of migraines who presents to the Emergency Department complaining of gradually worsening headache for 4x days but the migraine she experienced today was worse with associated nausea, vomiting, and neck stiffness. In prior occurences the headaches are behind her bilateral eyes but more so the left eye.  She states that while she was driving tonight at 5:73 pm the migraine, in the same location, got more severe, and she also complains of pain that radiated down her right side.  Pt typically gets an injection and IV for her migraines.  Pt also complaining of bladder incontinence. She recently started on C-pap  HASSAN,SAMI, MD [facility] [place]  Past Medical History  Diagnosis Date  . Sinus infection   . Hypertension   . Arthritis   . Spinal stenosis   . Glaucoma   . Carpal tunnel syndrome   . Depression   . Bipolar 2 disorder   . Obesity   . Arthritis   . Glaucoma   . Leiomyoma of uterus, unspecified   . Sleep apnea    Past Surgical History  Procedure Laterality Date  . Carpal tunnel release      Left Hand  . Abdominal hysterectomy    . Knee surgery Left   . Myelogram     Family History  Problem Relation Age of Onset  . Hypertension     History  Substance Use Topics  . Smoking status: Never  Smoker   . Smokeless tobacco: Never Used  . Alcohol Use: No   OB History   Grav Para Term Preterm Abortions TAB SAB Ect Mult Living                 Review of Systems  Constitutional: Negative for fever and chills.  HENT: Negative for rhinorrhea and sore throat.   Eyes: Positive for photophobia and visual disturbance.  Respiratory: Negative for cough and shortness of breath.   Cardiovascular: Negative for chest pain.  Gastrointestinal: Positive for nausea and vomiting. Negative for abdominal pain and diarrhea.  Genitourinary: Negative for dysuria.  Musculoskeletal: Positive for back pain and neck pain.  Skin: Negative for rash.  Neurological: Positive for headaches. Negative for dizziness and weakness.  Hematological: Does not bruise/bleed easily.  Psychiatric/Behavioral: Negative for confusion.    Allergies  Ibuprofen  Home Medications   Current Outpatient Rx  Name  Route  Sig  Dispense  Refill  . amitriptyline (ELAVIL) 100 MG tablet   Oral   Take 1 tablet (100 mg total) by mouth at bedtime.   30 tablet   0   . brimonidine-timolol (COMBIGAN) 0.2-0.5 % ophthalmic solution   Both Eyes   Place 1 drop into both eyes 2 (two) times daily.         . busPIRone (BUSPAR) 5 MG tablet   Oral   Take 1  tablet (5 mg total) by mouth 3 (three) times daily.   90 tablet   0   . fluconazole (DIFLUCAN) 150 MG tablet      take 1 tablet by mouth as a single dose   1 tablet   0   . FLUoxetine (PROZAC) 40 MG capsule   Oral   Take 1 capsule (40 mg total) by mouth daily.   30 capsule   0   . gabapentin (NEURONTIN) 600 MG tablet   Oral   Take 1 tablet (600 mg total) by mouth 3 (three) times daily.   90 tablet   0   . hydrOXYzine (ATARAX/VISTARIL) 25 MG tablet   Oral   Take 1 tablet (25 mg total) by mouth 3 (three) times daily.   90 tablet   0   . lisinopril (PRINIVIL,ZESTRIL) 40 MG tablet   Oral   Take 1 tablet (40 mg total) by mouth daily.         Marland Kitchen LORazepam  (ATIVAN) 0.5 MG tablet   Oral   Take 1 tablet (0.5 mg total) by mouth daily.   30 tablet   0   . omeprazole (PRILOSEC) 40 MG capsule   Oral   Take 1 capsule (40 mg total) by mouth daily.         Marland Kitchen oxyCODONE-acetaminophen (PERCOCET) 10-325 MG per tablet   Oral   Take 1 tablet by mouth 3 (three) times daily.         . promethazine (PHENERGAN) 25 MG tablet   Oral   Take 1 tablet (25 mg total) by mouth every 6 (six) hours as needed for nausea or vomiting.   20 tablet   0   . topiramate (TOPAMAX) 100 MG tablet   Oral   Take 1 tablet (100 mg total) by mouth daily.   30 tablet   1   . Travoprost, BAK Free, (TRAVATAN) 0.004 % SOLN ophthalmic solution   Both Eyes   Place 1 drop into both eyes at bedtime.          BP 146/68  Pulse 76  Temp(Src) 98.5 F (36.9 C) (Oral)  Resp 18  Ht 5\' 6"  (1.676 m)  Wt 312 lb (141.522 kg)  BMI 50.38 kg/m2  SpO2 98% Physical Exam  Nursing note and vitals reviewed. Constitutional: She is oriented to person, place, and time. She appears well-developed and well-nourished. No distress.  HENT:  Head: Normocephalic and atraumatic.  Eyes: EOM are normal.  Neck: Neck supple. No tracheal deviation present.  Cardiovascular: Normal rate and regular rhythm.   Pulmonary/Chest: Effort normal and breath sounds normal. No respiratory distress. She has no rales.  Abdominal: Soft. Bowel sounds are normal. There is no tenderness.  Musculoskeletal: Normal range of motion. She exhibits no edema.  Neurological: She is alert and oriented to person, place, and time. No cranial nerve deficit. She exhibits normal muscle tone. Coordination normal.  Skin: Skin is warm and dry.  Psychiatric: She has a normal mood and affect. Her behavior is normal.    ED Course  Procedures (including critical care time)  DIAGNOSTIC STUDIES: Oxygen Saturation is 98% on RA, normal by my interpretation.    COORDINATION OF CARE: 8:23 PM- Pt advised of plan for treatment  including migraine cocktail and pt agrees.    Labs Review Labs Reviewed - No data to display Imaging Review No results found.  EKG Interpretation   None       MDM   1. Migraine  The patient with history of migraines this is typical for her migraine just a bit worse. He was suffering from migraines for the past several days has not been able to break the cycle. Patient improved here with a migraine cocktail to include the Decadron Phenergan Benadryl and also 1 mg of hydromorphone. Patient feeling better. Will discharge home with Phenergan as needed followup with her doctor. Patient nontoxic no acute distress.  I personally performed the services described in this documentation, which was scribed in my presence. The recorded information has been reviewed and is accurate.     Mervin Kung, MD 05/27/13 [phone]

## 2013-05-27 NOTE — Discharge Instructions (Signed)
Migraine Headache A migraine headache is an intense, throbbing pain on one or both sides of your head. A migraine can last for 30 minutes to several hours. CAUSES  The exact cause of a migraine headache is not always known. However, a migraine may be caused when nerves in the brain become irritated and release chemicals that cause inflammation. This causes pain. Certain things may also trigger migraines, such as:  Alcohol.  Smoking.  Stress.  Menstruation.  Aged cheeses.  Foods or drinks that contain nitrates, glutamate, aspartame, or tyramine.  Lack of sleep.  Chocolate.  Caffeine.  Hunger.  Physical exertion.  Fatigue.  Medicines used to treat chest pain (nitroglycerine), birth control pills, estrogen, and some blood pressure medicines. SIGNS AND SYMPTOMS  Pain on one or both sides of your head.  Pulsating or throbbing pain.  Severe pain that prevents daily activities.  Pain that is aggravated by any physical activity.  Nausea, vomiting, or both.  Dizziness.  Pain with exposure to bright lights, loud noises, or activity.  General sensitivity to bright lights, loud noises, or smells. Before you get a migraine, you may get warning signs that a migraine is coming (aura). An aura may include:  Seeing flashing lights.  Seeing bright spots, halos, or zig-zag lines.  Having tunnel vision or blurred vision.  Having feelings of numbness or tingling.  Having trouble talking.  Having muscle weakness. DIAGNOSIS  A migraine headache is often diagnosed based on:  Symptoms.  Physical exam.  A CT scan or MRI of your head. These imaging tests cannot diagnose migraines, but they can help rule out other causes of headaches. TREATMENT Medicines may be given for pain and nausea. Medicines can also be given to help prevent recurrent migraines.  HOME CARE INSTRUCTIONS  Only take over-the-counter or prescription medicines for pain or discomfort as directed by your  health care provider. The use of long-term narcotics is not recommended.  Lie down in a dark, quiet room when you have a migraine.  Keep a journal to find out what may trigger your migraine headaches. For example, write down:  What you eat and drink.  How much sleep you get.  Any change to your diet or medicines.  Limit alcohol consumption.  Quit smoking if you smoke.  Get 7 9 hours of sleep, or as recommended by your health care provider.  Limit stress.  Keep lights dim if bright lights bother you and make your migraines worse. SEEK IMMEDIATE MEDICAL CARE IF:   Your migraine becomes severe.  You have a fever.  You have a stiff neck.  You have vision loss.  You have muscular weakness or loss of muscle control.  You start losing your balance or have trouble walking.  You feel faint or pass out.  You have severe symptoms that are different from your first symptoms. MAKE SURE YOU:   Understand these instructions.  Will watch your condition.  Will get help right away if you are not doing well or get worse. Document Released: 04/20/2005 Document Revised: 02/08/2013 Document Reviewed: 12/26/2012 [facility] Patient Information 2014 [place].  Home and rest followup with your doctor in the next few days if not better. Return for any new or worse symptoms. Medications given here with some long-acting medication. If you need to supplement with the Phenergan as per the prescription tomorrow that will be fine.

## 2013-05-27 NOTE — ED Notes (Addendum)
Patient states that she has "untreated MS" and that she has headaches, weakness, generalized muscle pain, incontinence and she doesn't know if maybe that is causing the migraine.

## 2013-05-27 NOTE — ED Notes (Signed)
Worsening headache x four days.  Reports this is more intense than her usually migraines.  Difficulty turning her head to the right d/t the pain.  Reports some blurred vision, eye sensitive to light.  Denies vomiting, c/o some nausea.

## 2013-05-31 ENCOUNTER — Telehealth: Payer: Self-pay | Admitting: Nurse Practitioner

## 2013-05-31 ENCOUNTER — Telehealth: Payer: Self-pay | Admitting: Neurology

## 2013-05-31 NOTE — Telephone Encounter (Signed)
Patient calling to make an appt. Following a trip to the ER. Patient was advised to make an appt. With provider for severe migraines, after being seen in ER on 05/27/13. You have opening on 06/01/13 and 06/02/13. Please advise.

## 2013-05-31 NOTE — Telephone Encounter (Signed)
Called patient to schedule an appt. For 06/01/13 at 9:30 am, for ER f/u. Patient stated that she would be here. I advised the patient the if she has any other problems, questions or concerns to call the office. Patient verbalized understanding.

## 2013-05-31 NOTE — Telephone Encounter (Signed)
Pt called today because of a recent hospitalization for migraine headache.  She is requesting an appointment with a physician as soon as possible.  She states that her "life is stopping" because of her inability to deal with the pain from the migraines.

## 2013-05-31 NOTE — Telephone Encounter (Signed)
Schedule appt?

## 2013-06-01 ENCOUNTER — Telehealth: Payer: Self-pay | Admitting: Nurse Practitioner

## 2013-06-01 ENCOUNTER — Ambulatory Visit: Payer: Medicaid Other | Admitting: Neurology

## 2013-06-01 ENCOUNTER — Ambulatory Visit: Payer: Self-pay | Admitting: Nurse Practitioner

## 2013-06-01 NOTE — Telephone Encounter (Signed)
No show for scheduled appt 

## 2013-06-06 ENCOUNTER — Other Ambulatory Visit (HOSPITAL_COMMUNITY): Payer: Self-pay | Admitting: Internal Medicine

## 2013-06-06 DIAGNOSIS — Z1231 Encounter for screening mammogram for malignant neoplasm of breast: Secondary | ICD-10-CM

## 2013-06-09 ENCOUNTER — Ambulatory Visit: Payer: Medicaid Other | Admitting: Neurology

## 2013-06-23 NOTE — Telephone Encounter (Signed)
Pt came in for her visit, closing encounter °

## 2013-06-29 ENCOUNTER — Ambulatory Visit (HOSPITAL_COMMUNITY): Payer: Medicaid Other

## 2013-07-03 ENCOUNTER — Ambulatory Visit (HOSPITAL_COMMUNITY): Admission: RE | Admit: 2013-07-03 | Payer: Medicaid Other | Source: Ambulatory Visit

## 2013-07-05 ENCOUNTER — Telehealth: Payer: Self-pay | Admitting: Neurology

## 2013-07-12 NOTE — Telephone Encounter (Signed)
I called pt and gave her appt for 1100 tomorrow 07-13-13.  She stated she would take this.  I informed her that she needs to bring her machine in with her.  It will be cumbersome to get to, but per Dr. Rexene Alberts would like to have it the first time.  I did speak to her about no show appts and to let us know if she will not be able to make appt asap.  I told her that we did not want to lose her as a pt, but after 3  no show appts, pts are dismissed.   Pt understood.

## 2013-07-13 ENCOUNTER — Ambulatory Visit (INDEPENDENT_AMBULATORY_CARE_PROVIDER_SITE_OTHER): Payer: Medicaid Other | Admitting: Neurology

## 2013-07-13 ENCOUNTER — Encounter (INDEPENDENT_AMBULATORY_CARE_PROVIDER_SITE_OTHER): Payer: Self-pay

## 2013-07-13 ENCOUNTER — Encounter: Payer: Self-pay | Admitting: Neurology

## 2013-07-13 VITALS — BP 133/83 | HR 96 | Temp 98.7°F | Ht 66.0 in | Wt 355.0 lb

## 2013-07-13 DIAGNOSIS — G43109 Migraine with aura, not intractable, without status migrainosus: Secondary | ICD-10-CM

## 2013-07-13 DIAGNOSIS — G4733 Obstructive sleep apnea (adult) (pediatric): Secondary | ICD-10-CM

## 2013-07-13 DIAGNOSIS — Z9989 Dependence on other enabling machines and devices: Principal | ICD-10-CM

## 2013-07-13 NOTE — Patient Instructions (Addendum)
Please continue using your CPAP regularly. While your insurance requires that you use CPAP at least 4 hours each night on 70% of the nights, I recommend, that you not skip any nights and use it throughout the night if you can. Getting used to CPAP does take time and patience and discipline. Untreated obstructive sleep apnea when it is moderate to severe can have an adverse impact on cardiovascular health and raise her risk for heart disease, arrhythmias, hypertension, congestive heart failure, stroke and diabetes. Untreated obstructive sleep apnea causes sleep disruption, nonrestorative sleep, and sleep deprivation. This can have an impact on your day to day functioning and cause daytime sleepiness and impairment of cognitive function, memory loss, mood disturbance, and problems focussing. Using CPAP regularly can improve these symptoms.  Follow up with me in 6 months for sleep apnea.  Followup with Cecille Rubin next available appointment.  Check with your ophthalmologist regarding taking topamax with your history of glaucoma.

## 2013-07-13 NOTE — Progress Notes (Signed)
Subjective:    Patient ID: Denali Sharma is a 48 y.o. female.  HPI    Interim history:   Ms. Wolden is a 48 year old right-handed woman with an underlying medical history of morbid obesity, depression, history of overdose with admission d/t overdose on benzodiazepine in 2014, recurrent headaches, history of cervical spinal stenosis, who presents for followup consultation of her obstructive sleep apnea. She is unaccompanied today. I first met her on 01/27/2013 at which time we talked about her baseline sleep study from 01/17/2013 which showed a total AHI of 10.5 per hour. Based on her test results and her oxyhemoglobin desaturation nadir of 77% and her history of recurrent headaches, I asked her to come back for a CPAP titration study. She had this on 04/28/2013 and I went over her test results with her in detail today. Her sleep efficiency was near normal at 89.1% with a latency to sleep of only 1.5 minutes and wake after sleep onset of 39.5 minutes with moderate sleep fragmentation noted. She had an increased percentage of stage II sleep, an increased percentage of deep sleep, and a reduced percentage of REM sleep with a prolonged REM latency. She had moderate periodic leg movements of sleep with a mild arousal index of 6.4. She had CPAP titration from 5-12 cm with reduction of her AHI to 1 event per hour at the final pressure with supine REM sleep achieved. Based on the test results I prescribed CPAP treatment for her at home.   Today I reviewed her compliance him from 06/13/2013 through 07/12/2013 which is the last 30 days, during which time she was CPAP every night except for 4 nights. Percent used days greater than 4 hours was 80%, indicating very good compliance. Average usage was 5 hours and 33 minutes. Residual AHI is 2 per hour but leak is quite high most of the time. 95th percentile of leak was 50.1 L per minute. Her set pressure is 11 cm with EPR of 1. Today, she reports that she no longer  wakes up with a migraine, but still has frequent migraines. She also feels that her daytime somnolence is slightly better. She has been on treatment since 1/15. She moves a lot d/t pain. She has leg pain and is no longer on Elavil. She does endorse some RLS symptoms; she continues to be on prozac generic 40 mg daily.   She had a baseline sleep study on 01/17/2013: Her sleep efficiency was normal at 91.6% with a latency to sleep of 10.5 minutes. Wake after sleep onset was 25 minutes with moderate sleep fragmentation noted. She had a normal percentage of stage I sleep, an increased percentage of stage II sleep, absence of slow-wave sleep and a decreased percentage of REM sleep at 15.3% with a prolonged REM latency. She had mild periodic leg movements at 9.6 per hour with an associated arousal index of 3.1 per hour. She had intermittent mild to moderate snoring. She had an overall AHI of 10.5 per hour, rising to 42.4 per hour and REM sleep. Her baseline oxygen saturation was 94% and her nadir and REM sleep was 77%. She spent 11 minutes and 36 seconds below the saturation of 90% for the night.  The patient has mild to moderate RLS symptoms and is known to kick while asleep or before falling asleep. There is no family history of RLS or OSA.  She is a restless sleeper and in the morning, the bed is quite disheveled.  She denies cataplexy, sleep paralysis, hypnagogic  or hypnopompic hallucinations, or sleep attacks. She does not report any vivid dreams, nightmares, dream enactments, or parasomnias, such as sleep talking or sleep walking. She consumes no caffeinated beverages on any given day.  Her bedroom is usually dark and cool. There is a TV in the bedroom and usually it is not on at night.   Her Past Medical History Is Significant For: Past Medical History  Diagnosis Date  . Sinus infection   . Hypertension   . Arthritis   . Spinal stenosis   . Glaucoma   . Carpal tunnel syndrome   . Depression   .  Bipolar 2 disorder   . Obesity   . Arthritis   . Glaucoma   . Leiomyoma of uterus, unspecified   . Sleep apnea     Her Past Surgical History Is Significant For: Past Surgical History  Procedure Laterality Date  . Carpal tunnel release      Left Hand  . Abdominal hysterectomy    . Knee surgery Left   . Myelogram      Her Family History Is Significant For: Family History  Problem Relation Age of Onset  . Hypertension      Her Social History Is Significant For: History   Social History  . Marital Status: Married    Spouse Name: Ilona Sorrel     Number of Children: 3  . Years of Education: N/A   Occupational History  .     Social History Main Topics  . Smoking status: Never Smoker   . Smokeless tobacco: Never Used  . Alcohol Use: No  . Drug Use: No  . Sexual Activity: Not Currently    Partners: Male    Birth Control/ Protection: Surgical   Other Topics Concern  . None   Social History Narrative   Patient is married to [employer].    Patient has 3 children.    Patient denies caffeine.           Her Allergies Are:  Allergies  Allergen Reactions  . Ibuprofen Nausea And Vomiting    Due to gird  :   Her Current Medications Are:  Outpatient Encounter Prescriptions as of 07/13/2013  Medication Sig  . brimonidine-timolol (COMBIGAN) 0.2-0.5 % ophthalmic solution Place 1 drop into both eyes 2 (two) times daily.  . busPIRone (BUSPAR) 5 MG tablet Take 1 tablet (5 mg total) by mouth 3 (three) times daily.  Marland Kitchen FLUoxetine (PROZAC) 40 MG capsule Take 1 capsule (40 mg total) by mouth daily.  Marland Kitchen gabapentin (NEURONTIN) 600 MG tablet Take 1 tablet (600 mg total) by mouth 3 (three) times daily.  . hydrOXYzine (ATARAX/VISTARIL) 25 MG tablet Take 1 tablet (25 mg total) by mouth 3 (three) times daily.  Marland Kitchen lisinopril (PRINIVIL,ZESTRIL) 40 MG tablet Take 1 tablet (40 mg total) by mouth daily.  Marland Kitchen LORazepam (ATIVAN) 0.5 MG tablet Take 1 tablet (0.5 mg total) by mouth daily.  Marland Kitchen  omeprazole (PRILOSEC) 40 MG capsule Take 1 capsule (40 mg total) by mouth daily.  Marland Kitchen oxyCODONE-acetaminophen (PERCOCET) 10-325 MG per tablet Take 1 tablet by mouth 3 (three) times daily.  . promethazine (PHENERGAN) 25 MG tablet Take 1 tablet (25 mg total) by mouth every 6 (six) hours as needed for nausea or vomiting.  . topiramate (TOPAMAX) 100 MG tablet Take 1 tablet (100 mg total) by mouth daily.  . Travoprost, BAK Free, (TRAVATAN) 0.004 % SOLN ophthalmic solution Place 1 drop into both eyes at bedtime.  Marland Kitchen amitriptyline (ELAVIL)  100 MG tablet Take 1 tablet (100 mg total) by mouth at bedtime.  . fluconazole (DIFLUCAN) 150 MG tablet take 1 tablet by mouth as a single dose  :  Review of Systems:  Out of a complete 14 point review of systems, all are reviewed and negative with the exception of these symptoms as listed below:   Review of Systems  Constitutional: Negative.   Eyes: Positive for visual disturbance (loss of vision).  Respiratory: Negative.   Cardiovascular: Negative.   Gastrointestinal: Negative.   Endocrine: Negative.   Genitourinary: Positive for frequency and enuresis.  Musculoskeletal: Positive for arthralgias, back pain, gait problem, joint swelling, myalgias, neck pain and neck stiffness.  Skin: Negative.   Allergic/Immunologic: Negative.   Neurological: Positive for numbness and headaches.       Memory loss   Hematological: Negative.   Psychiatric/Behavioral: Positive for confusion, dysphoric mood and decreased concentration. The patient is nervous/anxious.     Objective:  Neurologic Exam  Physical Exam Physical Examination:   Filed Vitals:   07/13/13 1142  BP: 133/83  Pulse: 96  Temp: 98.7 F (37.1 C)    General Examination: The patient is a very pleasant 48 y.o. female in no acute distress. She appears well-developed and well-nourished and adequately groomed. She is obese.   HEENT: Normocephalic, atraumatic, pupils are equal, round and reactive to light  and accommodation. Extraocular tracking is good without limitation to gaze excursion or nystagmus noted. Fundoscopy is normal. Tympanic membranes are clear. Normal smooth pursuit is noted. Hearing is grossly intact. Face is symmetric with normal facial animation and normal facial sensation. Speech is clear with no dysarthria noted. There is no hypophonia. There is no lip, neck/head, jaw or voice tremor. Neck shows decrease in ROM. There are no carotid bruits on auscultation. Oropharynx exam reveals: mild mouth dryness, adequate dental hygiene and marked airway crowding, due to large tongue, large uvula and tonsillar size of 2+. Mallampati is class II. Tongue protrudes centrally and palate elevates symmetrically. Tonsils are 2+ in size. Neck size is 18 7/8 inches.   Chest: Clear to auscultation without wheezing, rhonchi or crackles noted.  Heart: S1+S2+0, regular and normal without murmurs, rubs or gallops noted.   Abdomen: Soft, non-tender and non-distended with normal bowel sounds appreciated on auscultation.  Extremities: There is 1+ pitting edema in the distal lower extremities bilaterally. Pedal pulses are intact.  Skin: Warm and dry without trophic changes noted. There are no varicose veins.  Musculoskeletal: exam reveals no obvious joint deformities, tenderness or joint swelling or erythema.   Neurologically:  Mental status: The patient is awake, alert and oriented in all 4 spheres. Her memory, attention, language and knowledge are appropriate. There is no aphasia, agnosia, apraxia or anomia. Speech is clear with normal prosody and enunciation. Thought process is linear. Mood is congruent and affect is normal.  Cranial nerves are as described above under HEENT exam. In addition, shoulder shrug is normal with equal shoulder height noted. Motor exam: Normal bulk, strength and tone is noted. There is no drift, tremor or rebound. Romberg is negative. Reflexes are 1+ in the upper extremities and  absent in both knees, and 1+ in both ankles. Fine motor skills are intact. Cerebellar testing shows no dysmetria or intention tremor on finger to nose testing. There is no truncal or gait ataxia.  Sensory exam is intact to light touch, pinprick, vibration, temperature sense in the upper and lower extremities.  Gait, station and balance: She stands up with difficulty  and has to push herself up. She walks with a cane and walks slowly. Her stance is wide-based. Posture is mildly stooped for age. She's not able to do tandem walk. She's not able to do toe stance. Balance is mildly impaired.  Assessment and Plan:   In summary, Carolle Ishii is a very pleasant 48 y.o.-year old female with an underlying medical history of morbid obesity, depression, history of overdose with admission d/t overdose on benzodiazepine in 2014, recurrent headaches, history of cervical spinal stenosis, s/p surgery, who presents for followup consultation of her obstructive sleep apnea. She is now on treatment with CPAP on 11 cm with good tolerance and improvement in her night time migraines and EDS. I talked to her about her recent sleep study results and her compliance state I congratulated her on being compliant with treatment but asked her not to skip any days. She is also advised that it may take a few months for her to notice an improvement with her headache frequency with treatment of her OSA. Not all symptoms improve at the same rate. She also has a complicated history and probably more than one reason to have recurrent headaches. For her headaches she is going to followup with our nurse practitioner, Cecille Rubin. She is advised to followup with me routinely for sleep apnea management in 6 months from now, sooner if the need arises.    We also talked about trying to maintaining a healthy lifestyle in general. I encouraged the patient to eat healthy, exercise daily and keep well hydrated, to keep a scheduled bedtime and wake time  routine, to not skip any meals and eat healthy snacks in between meals and to have protein with every meal. I stressed the importance of regular exercise.   I answered all her questions today and the patient was in agreement with the above outlined plan. I encouraged her to call with any interim questions, concerns, problems or updates.

## 2013-07-17 ENCOUNTER — Encounter: Payer: Self-pay | Admitting: Neurology

## 2013-07-24 ENCOUNTER — Emergency Department (HOSPITAL_COMMUNITY)
Admission: EM | Admit: 2013-07-24 | Discharge: 2013-07-24 | Disposition: A | Discharge status: Home / Self Care | Payer: Medicaid Other | Attending: Emergency Medicine | Admitting: Emergency Medicine

## 2013-07-24 ENCOUNTER — Encounter (HOSPITAL_COMMUNITY): Payer: Self-pay | Admitting: Emergency Medicine

## 2013-07-24 DIAGNOSIS — E669 Obesity, unspecified: Secondary | ICD-10-CM | POA: Insufficient documentation

## 2013-07-24 DIAGNOSIS — R51 Headache: Secondary | ICD-10-CM | POA: Insufficient documentation

## 2013-07-24 DIAGNOSIS — M129 Arthropathy, unspecified: Secondary | ICD-10-CM | POA: Insufficient documentation

## 2013-07-24 DIAGNOSIS — Z8742 Personal history of other diseases of the female genital tract: Secondary | ICD-10-CM | POA: Insufficient documentation

## 2013-07-24 DIAGNOSIS — Z79899 Other long term (current) drug therapy: Secondary | ICD-10-CM | POA: Insufficient documentation

## 2013-07-24 DIAGNOSIS — Z8709 Personal history of other diseases of the respiratory system: Secondary | ICD-10-CM | POA: Insufficient documentation

## 2013-07-24 DIAGNOSIS — H409 Unspecified glaucoma: Secondary | ICD-10-CM | POA: Insufficient documentation

## 2013-07-24 DIAGNOSIS — F3189 Other bipolar disorder: Secondary | ICD-10-CM | POA: Insufficient documentation

## 2013-07-24 DIAGNOSIS — I10 Essential (primary) hypertension: Secondary | ICD-10-CM | POA: Insufficient documentation

## 2013-07-24 DIAGNOSIS — R519 Headache, unspecified: Secondary | ICD-10-CM

## 2013-07-24 MED ORDER — OXYCODONE-ACETAMINOPHEN 5-325 MG PO TABS: 2.0000 | ORAL_TABLET | Freq: Once | ORAL | Status: DC

## 2013-07-24 MED ORDER — METHOCARBAMOL 100 MG/ML IJ SOLN
1000.0000 mg | Freq: Once | INTRAMUSCULAR | Status: DC
  Filled 2013-07-24 (×2): qty 10

## 2013-07-24 MED ORDER — METHOCARBAMOL 500 MG PO TABS: 500.0000 mg | ORAL_TABLET | Freq: Two times a day (BID) | ORAL | Status: DC

## 2013-07-24 NOTE — ED Notes (Signed)
Pt c/o right posterior head pain that radiates to the top pf her head.  Also has neck pain and if she lays flat or turns her head at all pain shoots down her right arm. Pt states that she does have migraines and and this is different than those. Pt states even sensitive to the touch.

## 2013-07-24 NOTE — Discharge Instructions (Signed)
Follow up with your doctor in 2 days. Take medications as directed. Please return to Emergency department if you develop any worsening headache, fever, or nausea/vomiting.    Headaches, Frequently Asked Questions MIGRAINE HEADACHES Q: What is migraine? What causes it? How can I treat it? A: Generally, migraine headaches begin as a dull ache. Then they develop into a constant, throbbing, and pulsating pain. You may experience pain at the temples. You may experience pain at the front or back of one or both sides of the head. The pain is usually accompanied by a combination of:  Nausea.  Vomiting.  Sensitivity to light and noise. Some people (about 15%) experience an aura (see below) before an attack. The cause of migraine is believed to be chemical reactions in the brain. Treatment for migraine may include over-the-counter or prescription medications. It may also include self-help techniques. These include relaxation training and biofeedback.  Q: What is an aura? A: About 15% of people with migraine get an "aura". This is a sign of neurological symptoms that occur before a migraine headache. You may see wavy or jagged lines, dots, or flashing lights. You might experience tunnel vision or blind spots in one or both eyes. The aura can include visual or auditory hallucinations (something imagined). It may include disruptions in smell (such as strange odors), taste or touch. Other symptoms include:  Numbness.  A "pins and needles" sensation.  Difficulty in recalling or speaking the correct word. These neurological events may last as long as 60 minutes. These symptoms will fade as the headache begins. Q: What is a trigger? A: Certain physical or environmental factors can lead to or "trigger" a migraine. These include:  Foods.  Hormonal changes.  Weather.  Stress. It is important to remember that triggers are different for everyone. To help prevent migraine attacks, you need to figure out  which triggers affect you. Keep a headache diary. This is a good way to track triggers. The diary will help you talk to your healthcare professional about your condition. Q: Does weather affect migraines? A: Bright sunshine, hot, humid conditions, and drastic changes in barometric pressure may lead to, or "trigger," a migraine attack in some people. But studies have shown that weather does not act as a trigger for everyone with migraines. Q: What is the link between migraine and hormones? A: Hormones start and regulate many of your body's functions. Hormones keep your body in balance within a constantly changing environment. The levels of hormones in your body are unbalanced at times. Examples are during menstruation, pregnancy, or menopause. That can lead to a migraine attack. In fact, about three quarters of all women with migraine report that their attacks are related to the menstrual cycle.  Q: Is there an increased risk of stroke for migraine sufferers? A: The likelihood of a migraine attack causing a stroke is very remote. That is not to say that migraine sufferers cannot have a stroke associated with their migraines. In persons under age 16, the most common associated factor for stroke is migraine headache. But over the course of a person's normal life span, the occurrence of migraine headache may actually be associated with a reduced risk of dying from cerebrovascular disease due to stroke.  Q: What are acute medications for migraine? A: Acute medications are used to treat the pain of the headache after it has started. Examples over-the-counter medications, NSAIDs, ergots, and triptans.  Q: What are the triptans? A: Triptans are the newest class of abortive  medications. They are specifically targeted to treat migraine. Triptans are vasoconstrictors. They moderate some chemical reactions in the brain. The triptans work on receptors in your brain. Triptans help to restore the balance of a  neurotransmitter called serotonin. Fluctuations in levels of serotonin are thought to be a main cause of migraine.  Q: Are over-the-counter medications for migraine effective? A: Over-the-counter, or "OTC," medications may be effective in relieving mild to moderate pain and associated symptoms of migraine. But you should see your caregiver before beginning any treatment regimen for migraine.  Q: What are preventive medications for migraine? A: Preventive medications for migraine are sometimes referred to as "prophylactic" treatments. They are used to reduce the frequency, severity, and length of migraine attacks. Examples of preventive medications include antiepileptic medications, antidepressants, beta-blockers, calcium channel blockers, and NSAIDs (nonsteroidal anti-inflammatory drugs). Q: Why are anticonvulsants used to treat migraine? A: During the past few years, there has been an increased interest in antiepileptic drugs for the prevention of migraine. They are sometimes referred to as "anticonvulsants". Both epilepsy and migraine may be caused by similar reactions in the brain.  Q: Why are antidepressants used to treat migraine? A: Antidepressants are typically used to treat people with depression. They may reduce migraine frequency by regulating chemical levels, such as serotonin, in the brain.  Q: What alternative therapies are used to treat migraine? A: The term "alternative therapies" is often used to describe treatments considered outside the scope of conventional Western medicine. Examples of alternative therapy include acupuncture, acupressure, and yoga. Another common alternative treatment is herbal therapy. Some herbs are believed to relieve headache pain. Always discuss alternative therapies with your caregiver before proceeding. Some herbal products contain arsenic and other toxins. TENSION HEADACHES Q: What is a tension-type headache? What causes it? How can I treat it? A:  Tension-type headaches occur randomly. They are often the result of temporary stress, anxiety, fatigue, or anger. Symptoms include soreness in your temples, a tightening band-like sensation around your head (a "vice-like" ache). Symptoms can also include a pulling feeling, pressure sensations, and contracting head and neck muscles. The headache begins in your forehead, temples, or the back of your head and neck. Treatment for tension-type headache may include over-the-counter or prescription medications. Treatment may also include self-help techniques such as relaxation training and biofeedback. CLUSTER HEADACHES Q: What is a cluster headache? What causes it? How can I treat it? A: Cluster headache gets its name because the attacks come in groups. The pain arrives with little, if any, warning. It is usually on one side of the head. A tearing or bloodshot eye and a runny nose on the same side of the headache may also accompany the pain. Cluster headaches are believed to be caused by chemical reactions in the brain. They have been described as the most severe and intense of any headache type. Treatment for cluster headache includes prescription medication and oxygen. SINUS HEADACHES Q: What is a sinus headache? What causes it? How can I treat it? A: When a cavity in the bones of the face and skull (a sinus) becomes inflamed, the inflammation will cause localized pain. This condition is usually the result of an allergic reaction, a tumor, or an infection. If your headache is caused by a sinus blockage, such as an infection, you will probably have a fever. An x-ray will confirm a sinus blockage. Your caregiver's treatment might include antibiotics for the infection, as well as antihistamines or decongestants.  REBOUND HEADACHES Q: What is a  rebound headache? What causes it? How can I treat it? A: A pattern of taking acute headache medications too often can lead to a condition known as "rebound headache." A  pattern of taking too much headache medication includes taking it more than 2 days per week or in excessive amounts. That means more than the label or a caregiver advises. With rebound headaches, your medications not only stop relieving pain, they actually begin to cause headaches. Doctors treat rebound headache by tapering the medication that is being overused. Sometimes your caregiver will gradually substitute a different type of treatment or medication. Stopping may be a challenge. Regularly overusing a medication increases the potential for serious side effects. Consult a caregiver if you regularly use headache medications more than 2 days per week or more than the label advises. ADDITIONAL QUESTIONS AND ANSWERS Q: What is biofeedback? A: Biofeedback is a self-help treatment. Biofeedback uses special equipment to monitor your body's involuntary physical responses. Biofeedback monitors:  Breathing.  Pulse.  Heart rate.  Temperature.  Muscle tension.  Brain activity. Biofeedback helps you refine and perfect your relaxation exercises. You learn to control the physical responses that are related to stress. Once the technique has been mastered, you do not need the equipment any more. Q: Are headaches hereditary? A: Four out of five (80%) of people that suffer report a family history of migraine. Scientists are not sure if this is genetic or a family predisposition. Despite the uncertainty, a child has a 50% chance of having migraine if one parent suffers. The child has a 75% chance if both parents suffer.  Q: Can children get headaches? A: By the time they reach high school, most young people have experienced some type of headache. Many safe and effective approaches or medications can prevent a headache from occurring or stop it after it has begun.  Q: What type of doctor should I see to diagnose and treat my headache? A: Start with your primary caregiver. Discuss his or her experience and  approach to headaches. Discuss methods of classification, diagnosis, and treatment. Your caregiver may decide to recommend you to a headache specialist, depending upon your symptoms or other physical conditions. Having diabetes, allergies, etc., may require a more comprehensive and inclusive approach to your headache. The National Head[employer] Foundation will provide, upon request, a list of [facility] physician members in your state. Document Released: 07/11/2003 Document Revised: 07/13/2011 Document Reviewed: 12/19/2007 [facility] Patient Information 2014 [place].

## 2013-07-24 NOTE — ED Notes (Signed)
In addition to below note, pt states that headache began yesterday morning, that right sided weakness has been ongoing over the past couple of weeks.

## 2013-07-25 NOTE — ED Provider Notes (Signed)
Medical screening examination/treatment/procedure(s) were performed by non-physician practitioner and as supervising physician I was immediately available for consultation/collaboration.    Dot Lanes, MD 07/25/13 [phone]

## 2013-07-25 NOTE — ED Provider Notes (Signed)
CSN: [id number]     Arrival date & time 07/24/13  1534 History   First MD Initiated Contact with Patient 07/24/13 1712     Chief Complaint  Patient presents with  . Headache     (Consider location/radiation/quality/duration/timing/severity/associated sxs/prior Treatment) Patient is a 48 y.o. female presenting with headaches.  Headache  48 yo female with hx of migraines, HTN, Spinal Stenosis, and CTS presents with gradual onset of posterior Right neck/head pain. Patient states pain started yesterday and is gradually worsening. Pain currently rated at 10/10 and described as sharp and intermittent in nature. Pain is worse with "certain movements", and radiates to back of head and upper right shoulder. Patient denies fever/chills, dizziness, chest pain, sob, diaphoresis, N/V, abdominal pain, or visual changes. Patient states she experiences tingling in right hand. Admits to hx of CTS and states the tingling is unchanged and has been present since dx with CTS.   Past Medical History  Diagnosis Date  . Sinus infection   . Hypertension   . Arthritis   . Spinal stenosis   . Glaucoma   . Carpal tunnel syndrome   . Depression   . Bipolar 2 disorder   . Obesity   . Arthritis   . Glaucoma   . Leiomyoma of uterus, unspecified   . Sleep apnea    Past Surgical History  Procedure Laterality Date  . Carpal tunnel release      Left Hand  . Abdominal hysterectomy    . Knee surgery Left   . Myelogram     Family History  Problem Relation Age of Onset  . Hypertension     History  Substance Use Topics  . Smoking status: Never Smoker   . Smokeless tobacco: Never Used  . Alcohol Use: No   OB History   Grav Para Term Preterm Abortions TAB SAB Ect Mult Living                 Review of Systems  Neurological: Positive for headaches.  All other systems reviewed and are negative.      Allergies  Ibuprofen  Home Medications   Current Outpatient Rx  Name  Route  Sig  Dispense   Refill  . bimatoprost (LUMIGAN) 0.01 % SOLN   Both Eyes   Place 1 drop into both eyes at bedtime.         . brimonidine-timolol (COMBIGAN) 0.2-0.5 % ophthalmic solution   Both Eyes   Place 1 drop into both eyes 2 (two) times daily.         . busPIRone (BUSPAR) 5 MG tablet   Oral   Take 1 tablet (5 mg total) by mouth 3 (three) times daily.   90 tablet   0   . FLUoxetine (PROZAC) 40 MG capsule   Oral   Take 1 capsule (40 mg total) by mouth daily.   30 capsule   0   . gabapentin (NEURONTIN) 300 MG capsule   Oral   Take 600 mg by mouth 3 (three) times daily.         . hydrOXYzine (ATARAX/VISTARIL) 25 MG tablet   Oral   Take 1 tablet (25 mg total) by mouth 3 (three) times daily.   90 tablet   0   . lisinopril (PRINIVIL,ZESTRIL) 40 MG tablet   Oral   Take 1 tablet (40 mg total) by mouth daily.         Marland Kitchen LORazepam (ATIVAN) 0.5 MG tablet   Oral  Take 1 tablet (0.5 mg total) by mouth daily.   30 tablet   0   . omeprazole (PRILOSEC) 40 MG capsule   Oral   Take 1 capsule (40 mg total) by mouth daily.         Marland Kitchen oxyCODONE-acetaminophen (PERCOCET) 10-325 MG per tablet   Oral   Take 1 tablet by mouth 3 (three) times daily.         Marland Kitchen topiramate (TOPAMAX) 100 MG tablet   Oral   Take 1 tablet (100 mg total) by mouth daily.   30 tablet   1   . methocarbamol (ROBAXIN) 500 MG tablet   Oral   Take 1 tablet (500 mg total) by mouth 2 (two) times daily.   20 tablet   0    BP 131/55  Pulse 69  Temp(Src) 98.4 F (36.9 C) (Oral)  Resp 20  SpO2 97% Physical Exam  Nursing note and vitals reviewed. Constitutional: She is oriented to person, place, and time. She appears well-developed and well-nourished. No distress.  HENT:  Head: Normocephalic and atraumatic.  Right Ear: Tympanic membrane and ear canal normal.  Left Ear: Tympanic membrane and ear canal normal.  Nose: Nose normal. Right sinus exhibits no maxillary sinus tenderness and no frontal sinus  tenderness. Left sinus exhibits no maxillary sinus tenderness and no frontal sinus tenderness.  Mouth/Throat: Uvula is midline, oropharynx is clear and moist and mucous membranes are normal. No oropharyngeal exudate, posterior oropharyngeal edema or posterior oropharyngeal erythema.  Eyes: Conjunctivae and EOM are normal. Pupils are equal, round, and reactive to light. Right eye exhibits no discharge. Left eye exhibits no discharge. No scleral icterus.  Neck: Trachea normal, normal range of motion and phonation normal. Neck supple. No JVD present. Muscular tenderness present. No spinous process tenderness present. Carotid bruit is not present. No rigidity. No tracheal deviation, no edema, no erythema and normal range of motion present.    No midline spinal tenderness.  No meningeal signs on exam.    Cardiovascular: Normal rate and regular rhythm.  Exam reveals no gallop and no friction rub.   No murmur heard. Pulmonary/Chest: Effort normal and breath sounds normal. No stridor. No respiratory distress. She has no wheezes. She has no rhonchi. She has no rales.  Abdominal: Soft. There is no tenderness.  Musculoskeletal: Normal range of motion. She exhibits no edema.  Lymphadenopathy:    She has no cervical adenopathy.  Neurological: She is alert and oriented to person, place, and time. She has normal strength. No cranial nerve deficit or sensory deficit. She displays a negative Romberg sign. Coordination and gait normal.  CN II-XII grossly intact. Cerebellar function appears intact with finger to nose exam. Patient ambulates in room without assistance.    Skin: Skin is warm and dry. No rash noted. She is not diaphoretic.  Psychiatric: She has a normal mood and affect. Her behavior is normal.    ED Course  Procedures (including critical care time) Labs Review Labs Reviewed - No data to display Imaging Review No results found.   EKG Interpretation None      MDM   Final diagnoses:  HA  (headache)    Patient afebrile with normal VS. Patient in NAD. Patient states her HA is not typical of her migraines. Patient has no meningeal signs. Pain gradual in onset. No focal neuro deficits. Doubt meningitis, SAH, Temporal arteritis, or ICH.  Patient with hx of spinal stenosis, and CTS that would explain her hand tingling. Patient  has been seen in past for similar complaints with similar exam findings.  Discussed exam findings with patient. Advised follow up with PCP in 2 days. Recommend return to ED should symptoms worsen. Patient agrees with plan. Discharged in good condition.   Meds given in ED:  Medications - No data to display  Discharge Medication List as of 07/24/2013  7:35 PM    START taking these medications   Details  methocarbamol (ROBAXIN) 500 MG tablet Take 1 tablet (500 mg total) by mouth 2 (two) times daily., Starting 07/24/2013, Until Discontinued, Print         Sherrie George, PA-C 07/25/13 1359

## 2013-08-07 ENCOUNTER — Ambulatory Visit (HOSPITAL_COMMUNITY)
Admission: RE | Admit: 2013-08-07 | Discharge: 2013-08-07 | Disposition: A | Discharge status: Home / Self Care | Payer: Medicaid Other | Source: Ambulatory Visit | Attending: Internal Medicine | Admitting: Internal Medicine

## 2013-08-07 DIAGNOSIS — Z1231 Encounter for screening mammogram for malignant neoplasm of breast: Secondary | ICD-10-CM | POA: Insufficient documentation

## 2013-08-10 ENCOUNTER — Encounter: Payer: Self-pay | Admitting: Obstetrics

## 2013-08-10 ENCOUNTER — Ambulatory Visit (INDEPENDENT_AMBULATORY_CARE_PROVIDER_SITE_OTHER): Payer: Medicaid Other | Admitting: Obstetrics

## 2013-08-10 VITALS — BP 148/88 | HR 103 | Temp 98.7°F | Ht 67.0 in | Wt 338.0 lb

## 2013-08-10 DIAGNOSIS — L0293 Carbuncle, unspecified: Secondary | ICD-10-CM

## 2013-08-10 DIAGNOSIS — B373 Candidiasis of vulva and vagina: Secondary | ICD-10-CM

## 2013-08-10 DIAGNOSIS — L0292 Furuncle, unspecified: Secondary | ICD-10-CM

## 2013-08-10 DIAGNOSIS — A6000 Herpesviral infection of urogenital system, unspecified: Secondary | ICD-10-CM

## 2013-08-10 DIAGNOSIS — B3731 Acute candidiasis of vulva and vagina: Secondary | ICD-10-CM

## 2013-08-10 MED ORDER — VALACYCLOVIR HCL 500 MG PO TABS: ORAL_TABLET | ORAL | Status: DC

## 2013-08-10 MED ORDER — FLUCONAZOLE 150 MG PO TABS: 150.0000 mg | ORAL_TABLET | Freq: Once | ORAL | Status: DC

## 2013-08-10 MED ORDER — TRAMADOL HCL 50 MG PO TABS: 100.0000 mg | ORAL_TABLET | Freq: Four times a day (QID) | ORAL | Status: DC | PRN

## 2013-08-10 MED ORDER — CLINDAMYCIN HCL 300 MG PO CAPS
300.0000 mg | ORAL_CAPSULE | Freq: Three times a day (TID) | ORAL | Status: DC
Start: 2013-08-10 — End: 2013-12-02

## 2013-08-10 NOTE — Progress Notes (Signed)
Subjective:     Brenda Odonnell is a 48 y.o. female here for a routine exam.  Current complaints: Patient in the office today for a vaginal cyst. Patient states she has had this same cyst for several years. Patient states recently the cyst has gotten larger and is now painful.  Patient states she needs a refill on her Acyclovir.   The HPI was reviewed and explored in further detail by the provider. Gynecologic History No LMP recorded. Patient has had a hysterectomy. Contraception: abstinence  Obstetric History OB History  Gravida Para Term Preterm AB SAB TAB Ectopic Multiple Living  3 3 3       3     # Outcome Date GA Lbr Len/2nd Weight Sex Delivery Anes PTL Lv  3 TRM 09/10/89 [redacted]w[redacted]d  7 lb 10 oz (3.459 kg) F LTCS Spinal N Y  2 TRM 10/05/86 [redacted]w[redacted]d  7 lb 14 oz (3.572 kg) F LTCS Spinal N Y  1 TRM 06/23/84 [redacted]w[redacted]d  9 lb (4.082 kg) F LTCS Spinal N Y       The following portions of the patient's history were reviewed and updated as appropriate: allergies, current medications, past family history, past medical history, past social history, past surgical history and problem list.  Review of Systems A comprehensive review of systems was negative except for: Genitourinary: positive for genital lesions    Objective:    No exam performed today, patient left without being seen.      Assessment:    Vaginal cyst.   Plan:    F/U prn.

## 2013-09-21 ENCOUNTER — Ambulatory Visit: Payer: Medicaid Other | Admitting: Obstetrics

## 2013-10-05 ENCOUNTER — Encounter (HOSPITAL_COMMUNITY): Payer: Self-pay | Admitting: Emergency Medicine

## 2013-10-05 DIAGNOSIS — M79609 Pain in unspecified limb: Secondary | ICD-10-CM | POA: Insufficient documentation

## 2013-10-05 DIAGNOSIS — M545 Low back pain, unspecified: Secondary | ICD-10-CM | POA: Insufficient documentation

## 2013-10-05 DIAGNOSIS — E669 Obesity, unspecified: Secondary | ICD-10-CM | POA: Insufficient documentation

## 2013-10-05 DIAGNOSIS — F319 Bipolar disorder, unspecified: Secondary | ICD-10-CM | POA: Insufficient documentation

## 2013-10-05 DIAGNOSIS — Z8739 Personal history of other diseases of the musculoskeletal system and connective tissue: Secondary | ICD-10-CM | POA: Insufficient documentation

## 2013-10-05 DIAGNOSIS — I10 Essential (primary) hypertension: Secondary | ICD-10-CM | POA: Insufficient documentation

## 2013-10-05 DIAGNOSIS — Z792 Long term (current) use of antibiotics: Secondary | ICD-10-CM | POA: Insufficient documentation

## 2013-10-05 DIAGNOSIS — Z8669 Personal history of other diseases of the nervous system and sense organs: Secondary | ICD-10-CM | POA: Insufficient documentation

## 2013-10-05 DIAGNOSIS — M5412 Radiculopathy, cervical region: Secondary | ICD-10-CM | POA: Insufficient documentation

## 2013-10-05 DIAGNOSIS — Z8709 Personal history of other diseases of the respiratory system: Secondary | ICD-10-CM | POA: Insufficient documentation

## 2013-10-05 DIAGNOSIS — Z79899 Other long term (current) drug therapy: Secondary | ICD-10-CM | POA: Insufficient documentation

## 2013-10-05 DIAGNOSIS — Z8742 Personal history of other diseases of the female genital tract: Secondary | ICD-10-CM | POA: Insufficient documentation

## 2013-10-05 DIAGNOSIS — G8929 Other chronic pain: Secondary | ICD-10-CM | POA: Insufficient documentation

## 2013-10-05 NOTE — ED Notes (Signed)
The pt is c/o neck and back pain for a while.  She has spinal stenosis and she has  A brace on her rt wrist .  More pain in her lt upper arm

## 2013-10-06 ENCOUNTER — Emergency Department (HOSPITAL_COMMUNITY)
Admission: EM | Admit: 2013-10-06 | Discharge: 2013-10-06 | Disposition: A | Discharge status: Home / Self Care | Payer: Medicaid Other | Attending: Emergency Medicine | Admitting: Emergency Medicine

## 2013-10-06 DIAGNOSIS — M545 Low back pain, unspecified: Secondary | ICD-10-CM

## 2013-10-06 DIAGNOSIS — M5412 Radiculopathy, cervical region: Secondary | ICD-10-CM

## 2013-10-06 MED ORDER — DEXAMETHASONE SODIUM PHOSPHATE 10 MG/ML IJ SOLN
10.0000 mg | Freq: Once | INTRAMUSCULAR | Status: AC
  Administered 2013-10-06: 10 mg via INTRAMUSCULAR
  Filled 2013-10-06: qty 1

## 2013-10-06 MED ORDER — LIDOCAINE 5 % EX PTCH
1.0000 | MEDICATED_PATCH | CUTANEOUS | Status: DC
  Administered 2013-10-06: 1 via TRANSDERMAL
  Filled 2013-10-06: qty 1

## 2013-10-06 MED ORDER — DIAZEPAM 5 MG PO TABS: 5.0000 mg | ORAL_TABLET | Freq: Two times a day (BID) | ORAL | Status: DC

## 2013-10-06 MED ORDER — DIAZEPAM 5 MG PO TABS
5.0000 mg | ORAL_TABLET | Freq: Once | ORAL | Status: AC
  Administered 2013-10-06: 5 mg via ORAL
  Filled 2013-10-06: qty 1

## 2013-10-06 MED ORDER — OXYCODONE-ACETAMINOPHEN 5-325 MG PO TABS
2.0000 | ORAL_TABLET | Freq: Once | ORAL | Status: AC
  Administered 2013-10-06: 2 via ORAL
  Filled 2013-10-06: qty 2

## 2013-10-06 NOTE — Discharge Instructions (Signed)
Try valium for muscle spasm Continue your scheduled home medications  Follow-up with your surgeon Return to the emergency department if you develop any changing/worsening condition, weakness, loss of sensation, fever, or any other concerns (please read additional information regarding your condition below)    Cervical Radiculopathy Cervical radiculopathy happens when a nerve in the neck is pinched or bruised by a slipped (herniated) disk or by arthritic changes in the bones of the cervical spine. This can occur due to an injury or as part of the normal aging process. Pressure on the cervical nerves can cause pain or numbness that runs from your neck all the way down into your arm and fingers. CAUSES  There are many possible causes, including:  Injury.  Muscle tightness in the neck from overuse.  Swollen, painful joints (arthritis).  Breakdown or degeneration in the bones and joints of the spine (spondylosis) due to aging.  Bone spurs that may develop near the cervical nerves. SYMPTOMS  Symptoms include pain, weakness, or numbness in the affected arm and hand. Pain can be severe or irritating. Symptoms may be worse when extending or turning the neck. DIAGNOSIS  Your caregiver will ask about your symptoms and do a physical exam. He or she may test your strength and reflexes. X-rays, CT scans, and MRI scans may be needed in cases of injury or if the symptoms do not go away after a period of time. Electromyography (EMG) or nerve conduction testing may be done to study how your nerves and muscles are working. TREATMENT  Your caregiver may recommend certain exercises to help relieve your symptoms. Cervical radiculopathy can, and often does, get better with time and treatment. If your problems continue, treatment options may include:  Wearing a soft collar for short periods of time.  Physical therapy to strengthen the neck muscles.  Medicines, such as nonsteroidal anti-inflammatory drugs  (NSAIDs), oral corticosteroids, or spinal injections.  Surgery. Different types of surgery may be done depending on the cause of your problems. HOME CARE INSTRUCTIONS   Put ice on the affected area.  Put ice in a plastic bag.  Place a towel between your skin and the bag.  Leave the ice on for 15-20 minutes, 03-04 times a day or as directed by your caregiver.  If ice does not help, you can try using heat. Take a warm shower or bath, or use a hot water bottle as directed by your caregiver.  You may try a gentle neck and shoulder massage.  Use a flat pillow when you sleep.  Only take over-the-counter or prescription medicines for pain, discomfort, or fever as directed by your caregiver.  If physical therapy was prescribed, follow your caregiver's directions.  If a soft collar was prescribed, use it as directed. SEEK IMMEDIATE MEDICAL CARE IF:   Your pain gets much worse and cannot be controlled with medicines.  You have weakness or numbness in your hand, arm, face, or leg.  You have a high fever or a stiff, rigid neck.  You lose bowel or bladder control (incontinence).  You have trouble with walking, balance, or speaking. MAKE SURE YOU:   Understand these instructions.  Will watch your condition.  Will get help right away if you are not doing well or get worse. Document Released: 01/13/2001 Document Revised: 07/13/2011 Document Reviewed: 12/02/2010 [facility] Patient Information 2014 [place], [place].   Back Pain, Adult Low back pain is very common. About 1 in 5 people have back pain.The cause of low back pain is  rarely dangerous. The pain often gets better over time.About half of people with a sudden onset of back pain feel better in just 2 weeks. About 8 in 10 people feel better by 6 weeks.  CAUSES Some common causes of back pain include:  Strain of the muscles or ligaments supporting the spine.  Wear and tear (degeneration) of the spinal  discs.  Arthritis.  Direct injury to the back. DIAGNOSIS Most of the time, the direct cause of low back pain is not known.However, back pain can be treated effectively even when the exact cause of the pain is unknown.Answering your caregiver's questions about your overall health and symptoms is one of the most accurate ways to make sure the cause of your pain is not dangerous. If your caregiver needs more information, he or she may order lab work or imaging tests (X-rays or MRIs).However, even if imaging tests show changes in your back, this usually does not require surgery. HOME CARE INSTRUCTIONS For many people, back pain returns.Since low back pain is rarely dangerous, it is often a condition that people can learn to [facility] their own.   Remain active. It is stressful on the back to sit or stand in one place. Do not sit, drive, or stand in one place for more than 30 minutes at a time. Take short walks on level surfaces as soon as pain allows.Try to increase the length of time you walk each day.  Do not stay in bed.Resting more than 1 or 2 days can delay your recovery.  Do not avoid exercise or work.Your body is made to move.It is not dangerous to be active, even though your back may hurt.Your back will likely heal faster if you return to being active before your pain is gone.  Pay attention to your body when you bend and lift. Many people have less discomfortwhen lifting if they bend their knees, keep the load close to their bodies,and avoid twisting. Often, the most comfortable positions are those that put less stress on your recovering back.  Find a comfortable position to sleep. Use a firm mattress and lie on your side with your knees slightly bent. If you lie on your back, put a pillow under your knees.  Only take over-the-counter or prescription medicines as directed by your caregiver. Over-the-counter medicines to reduce pain and inflammation are often the most  helpful.Your caregiver may prescribe muscle relaxant drugs.These medicines help dull your pain so you can more quickly return to your normal activities and healthy exercise.  Put ice on the injured area.  Put ice in a plastic bag.  Place a towel between your skin and the bag.  Leave the ice on for 15-20 minutes, 03-04 times a day for the first 2 to 3 days. After that, ice and heat may be alternated to reduce pain and spasms.  Ask your caregiver about trying back exercises and gentle massage. This may be of some benefit.  Avoid feeling anxious or stressed.Stress increases muscle tension and can worsen back pain.It is important to recognize when you are anxious or stressed and learn ways to manage it.Exercise is a great option. SEEK MEDICAL CARE IF:  You have pain that is not relieved with rest or medicine.  You have pain that does not improve in 1 week.  You have new symptoms.  You are generally not feeling well. SEEK IMMEDIATE MEDICAL CARE IF:   You have pain that radiates from your back into your legs.  You develop new bowel  or bladder control problems.  You have unusual weakness or numbness in your arms or legs.  You develop nausea or vomiting.  You develop abdominal pain.  You feel faint. Document Released: 04/20/2005 Document Revised: 10/20/2011 Document Reviewed: 09/08/2010 [facility] Patient Information 2014 [place], [place].

## 2013-10-06 NOTE — ED Provider Notes (Signed)
CSN: [id number]     Arrival date & time 10/05/13  2300 History   First MD Initiated Contact with Patient 10/06/13 0043     Chief Complaint  Patient presents with  . Neck Pain  . Back Pain  . Arm Pain    HPI  Brenda Odonnell is a 48 y.o. female with a PMH of spinal stenosis, carpal tunnel syndrome, arthritis, HTN, depression, bipolar disorder, and sleep apnea who presents to the ED for evaluation of neck pain, back pain and arm pain. History was provided by the patient. Patient states she has had constant chronic unchanged pain in her neck, which has worsened over the past 3 weeks. She states her surgeon Dr. Dory Larsen who did an EMG last week, which showed nerve damage from a pinched nerve in her neck. She states her pain radiates down her right arm from her neck and is describes as a burning sharp pain worse with movement. No weakness or loss of sensation. She also has numbness and tingling in digits 1-3 of her right hand. She denies any new injuries or trauma. No chest pain or SOB. She has tried her oxycodone, gabapentin and lorazepam for this with no relief. She also has chronic lower back pain which is unchanged. No dysuria, loss of bowel/bladder function, abdominal pain, nausea, emesis. She denies any recent fevers, chills, or other concerns.    Past Medical History  Diagnosis Date  . Sinus infection   . Hypertension   . Arthritis   . Spinal stenosis   . Glaucoma   . Carpal tunnel syndrome   . Depression   . Bipolar 2 disorder   . Obesity   . Arthritis   . Glaucoma   . Leiomyoma of uterus, unspecified   . Sleep apnea    Past Surgical History  Procedure Laterality Date  . Carpal tunnel release      Left Hand  . Abdominal hysterectomy    . Knee surgery Left   . Myelogram    . Cesarean section     Family History  Problem Relation Age of Onset  . Hypertension    . Hypertension Father   . Hyperlipidemia Father    History  Substance Use Topics  . Smoking status: Never Smoker    . Smokeless tobacco: Never Used  . Alcohol Use: No   OB History   Grav Para Term Preterm Abortions TAB SAB Ect Mult Living   3 3 3       3       Review of Systems  Constitutional: Negative for fever, chills, activity change, appetite change and fatigue.  Respiratory: Negative for cough and shortness of breath.   Cardiovascular: Negative for chest pain and leg swelling.  Gastrointestinal: Negative for nausea, vomiting and abdominal pain.  Genitourinary: Negative for dysuria, decreased urine volume, vaginal bleeding, vaginal discharge, difficulty urinating, vaginal pain and pelvic pain.  Musculoskeletal: Positive for back pain (chronic) and neck pain (chronic). Negative for arthralgias, joint swelling and myalgias.  Neurological: Positive for numbness. Negative for weakness, light-headedness and headaches.    Allergies  Ibuprofen  Home Medications   Prior to Admission medications   Medication Sig Start Date End Date Taking? Authorizing Provider  bimatoprost (LUMIGAN) 0.01 % SOLN Place 1 drop into both eyes at bedtime.    Historical Provider, MD  brimonidine-timolol (COMBIGAN) 0.2-0.5 % ophthalmic solution Place 1 drop into both eyes 2 (two) times daily.    Historical Provider, MD  busPIRone (BUSPAR) 5 MG tablet  Take 1 tablet (5 mg total) by mouth 3 (three) times daily. 12/05/12   Nena Polio, PA-C  clindamycin (CLEOCIN) 300 MG capsule Take 1 capsule (300 mg total) by mouth 3 (three) times daily. 08/10/13   Shelly Bombard, MD  fluconazole (DIFLUCAN) 150 MG tablet Take 1 tablet (150 mg total) by mouth once. 08/10/13   Shelly Bombard, MD  FLUoxetine (PROZAC) 40 MG capsule Take 1 capsule (40 mg total) by mouth daily. 12/05/12   Nena Polio, PA-C  gabapentin (NEURONTIN) 300 MG capsule Take 600 mg by mouth 3 (three) times daily.    Historical Provider, MD  hydrOXYzine (ATARAX/VISTARIL) 25 MG tablet Take 1 tablet (25 mg total) by mouth 3 (three) times daily. 12/05/12   Nena Polio, PA-C   lisinopril (PRINIVIL,ZESTRIL) 40 MG tablet Take 1 tablet (40 mg total) by mouth daily. 12/05/12   Nena Polio, PA-C  LORazepam (ATIVAN) 0.5 MG tablet Take 1 tablet (0.5 mg total) by mouth daily. 12/05/12   Nena Polio, PA-C  methocarbamol (ROBAXIN) 500 MG tablet Take 1 tablet (500 mg total) by mouth 2 (two) times daily. 07/24/13   Sherrie George, PA-C  omeprazole (PRILOSEC) 40 MG capsule Take 1 capsule (40 mg total) by mouth daily. 12/05/12   Nena Polio, PA-C  oxyCODONE-acetaminophen (PERCOCET) 10-325 MG per tablet Take 1 tablet by mouth 3 (three) times daily.    Historical Provider, MD  topiramate (TOPAMAX) 100 MG tablet Take 1 tablet (100 mg total) by mouth daily. 01/27/13   Star Age, MD  traMADol (ULTRAM) 50 MG tablet Take 2 tablets (100 mg total) by mouth every 6 (six) hours as needed. 08/10/13   Shelly Bombard, MD  valACYclovir (VALTREX) 500 MG tablet 1 po bid x 3 days prn. 08/10/13   Shelly Bombard, MD   BP 114/53  Pulse 88  Temp(Src) 97.8 F (36.6 C)  Resp 22  Ht 5\' 6"  (1.676 m)  Wt 323 lb (146.512 kg)  BMI 52.16 kg/m2  SpO2 100%  Filed Vitals:   10/05/13 2308 10/06/13 0306  BP: 114/53 132/85  Pulse: 88 84  Temp: 97.8 F (36.6 C) 98.1 F (36.7 C)  TempSrc:  Oral  Resp: 22 18  Height: 5\' 6"  (1.676 m)   Weight: 323 lb (146.512 kg)   SpO2: 100% 95%    Physical Exam  Nursing note and vitals reviewed. Constitutional: She is oriented to person, place, and time. She appears well-developed and well-nourished. No distress.  Patient tearful  HENT:  Head: Normocephalic and atraumatic.  Right Ear: External ear normal.  Left Ear: External ear normal.  Mouth/Throat: Oropharynx is clear and moist.  Eyes: Conjunctivae are normal. Right eye exhibits no discharge. Left eye exhibits no discharge.  Neck: Normal range of motion. Neck supple.    Tenderness to palpation to the paraspinal muscles of the posterior neck (R > L). No cervical spinal tenderness.  Cardiovascular:  Normal rate, regular rhythm, normal heart sounds and intact distal pulses.  Exam reveals no gallop and no friction rub.   No murmur heard. Radial and dorsalis pedis pulses present and equal bilaterally  Pulmonary/Chest: Effort normal and breath sounds normal. No respiratory distress. She has no wheezes. She has no rales. She exhibits no tenderness.  Abdominal: Soft. She exhibits no distension. There is no tenderness.  Musculoskeletal: Normal range of motion. She exhibits tenderness. She exhibits no edema.       Arms: No tenderness to palpation to the UE throughout. Neck and arm  pain exacerbated with ROM of the right arm - patient reports pain that radiates down into her right hand. ROM of the right arm limited due to pain. Strength 5/5 in the upper and lower extremities bilaterally. Tenderness to palpation to the posterior lumbar paraspinal muscles diffusely. No lumbar spinal tenderness. Patient able to ambulate without difficulty or ataxia  Neurological: She is alert and oriented to person, place, and time.  Gross sensation intact in the UE and LE bilaterally. Patellar reflexes intact bilaterally  Skin: Skin is warm and dry. She is not diaphoretic.  Gross sensation intact in the UE and LE bilaterally     ED Course  Procedures (including critical care time) Labs Review Labs Reviewed - No data to display  Imaging Review No results found.   EKG Interpretation None      MDM   Brenda Odonnell is a 48 y.o. female with a PMH of spinal stenosis, carpal tunnel syndrome, arthritis, HTN, depression, bipolar disorder, and sleep apnea who presents to the ED for evaluation of neck pain, back pain and arm pain. Etiology of right arm pain likely due to right cervical radiculopathy. Patient's previous records and imaging were reviewed which shows extensive spinal stenosis and other chronic degenerative disease. Patent has a spinal specialist who is currently managing this. Neck and back pain on today's  visit is chronic and unchanged with no recent trauma. No warning signs or symptoms of back pain including loss of bowel or bladder control, night sweats, waking from sleep with back pain, unexplained fevers or weight loss, history of cancer, or IV drug use. No concern for cauda equina, epidural abscess, or other serious/life threatening cause of back pain. Patient had improvements in her pain throughout her ED visit. Encouraged to call her surgeon for continued care. Return precautions, discharge instructions, and follow-up was discussed with the patient before discharge.     Rechecks  2:30 AM = Pain minimally improved after Percocet. Ordering Valium and lidocaine patch.  3:05 AM = Patient states her pain has improved. Appears more comfortable. Ready for discharge.     Discharge Medication List as of 10/06/2013  3:11 AM    START taking these medications   Details  diazepam (VALIUM) 5 MG tablet Take 1 tablet (5 mg total) by mouth 2 (two) times daily., Starting 10/06/2013, Until Discontinued, Print        Final impressions: 1. Right cervical radiculopathy   2. Lumbar back pain      Harold Hedge [place], PA-C 10/07/13 0225

## 2013-10-06 NOTE — ED Notes (Signed)
Pt has chronic pain but has had worsening pain R arm today. Reports that she takes oxycodone 10mg , gabapentin and lorazepam for pain today.

## 2013-10-07 NOTE — ED Provider Notes (Signed)
Medical screening examination/treatment/procedure(s) were performed by non-physician practitioner and as supervising physician I was immediately available for consultation/collaboration.   EKG Interpretation None       Olga M Otter, MD 10/07/13 0750 

## 2013-10-23 ENCOUNTER — Other Ambulatory Visit: Payer: Self-pay | Admitting: Obstetrics

## 2013-11-08 ENCOUNTER — Telehealth: Payer: Self-pay | Admitting: Nurse Practitioner

## 2013-11-08 ENCOUNTER — Ambulatory Visit: Payer: Medicaid Other | Admitting: Nurse Practitioner

## 2013-11-08 NOTE — Telephone Encounter (Signed)
No show for scheduled appt 

## 2013-11-23 ENCOUNTER — Telehealth: Payer: Self-pay | Admitting: *Deleted

## 2013-11-23 NOTE — Telephone Encounter (Signed)
Called patient to r/s appointment, patient was r/s with NP LL due to NP CM never seen patient before, patient was scheduled with NP CM but no showed on both appointment times 06/01/13 & 11/08/13, appointment was changed to 12/11/13 at 2 pm.

## 2013-11-23 NOTE — Telephone Encounter (Signed)
Patient called back appointment was changed again to NP MM 12/11/13 at 3:30 pm.

## 2013-12-02 ENCOUNTER — Emergency Department (HOSPITAL_COMMUNITY)
Admission: EM | Admit: 2013-12-02 | Discharge: 2013-12-02 | Disposition: A | Discharge status: Home / Self Care | Payer: Medicaid Other | Attending: Emergency Medicine | Admitting: Emergency Medicine

## 2013-12-02 ENCOUNTER — Encounter (HOSPITAL_COMMUNITY): Payer: Self-pay | Admitting: Emergency Medicine

## 2013-12-02 DIAGNOSIS — H409 Unspecified glaucoma: Secondary | ICD-10-CM | POA: Diagnosis not present

## 2013-12-02 DIAGNOSIS — M542 Cervicalgia: Secondary | ICD-10-CM | POA: Insufficient documentation

## 2013-12-02 DIAGNOSIS — I10 Essential (primary) hypertension: Secondary | ICD-10-CM | POA: Diagnosis not present

## 2013-12-02 DIAGNOSIS — E669 Obesity, unspecified: Secondary | ICD-10-CM | POA: Insufficient documentation

## 2013-12-02 DIAGNOSIS — Z8742 Personal history of other diseases of the female genital tract: Secondary | ICD-10-CM | POA: Insufficient documentation

## 2013-12-02 DIAGNOSIS — Z79899 Other long term (current) drug therapy: Secondary | ICD-10-CM | POA: Insufficient documentation

## 2013-12-02 DIAGNOSIS — F3189 Other bipolar disorder: Secondary | ICD-10-CM | POA: Insufficient documentation

## 2013-12-02 DIAGNOSIS — Z791 Long term (current) use of non-steroidal anti-inflammatories (NSAID): Secondary | ICD-10-CM | POA: Insufficient documentation

## 2013-12-02 DIAGNOSIS — Z8709 Personal history of other diseases of the respiratory system: Secondary | ICD-10-CM | POA: Insufficient documentation

## 2013-12-02 DIAGNOSIS — M129 Arthropathy, unspecified: Secondary | ICD-10-CM | POA: Diagnosis not present

## 2013-12-02 MED ORDER — DIAZEPAM 5 MG PO TABS: 5.0000 mg | ORAL_TABLET | Freq: Once | ORAL | Status: DC

## 2013-12-02 MED ORDER — DIAZEPAM 5 MG PO TABS: 5.0000 mg | ORAL_TABLET | Freq: Four times a day (QID) | ORAL | Status: DC | PRN

## 2013-12-02 NOTE — Discharge Instructions (Signed)
You have neck pain, possibly from a cervical strain and/or pinched nerve.  ° °SEEK IMMEDIATE MEDICAL ATTENTION IF: °You develop difficulties swallowing or breathing.  °You have new or worse numbness, weakness, tingling, or movement problems in your arms or legs.  °You develop increasing pain which is uncontrolled with medications.  °You have change in bowel or bladder function, or other concerns. ° ° ° °

## 2013-12-02 NOTE — ED Notes (Signed)
Pt. reports right neck pain radiating to right shoulder onset yesterday unrelieved by prescription pain medications , denies injury , pain worse with movement and certain positions .

## 2013-12-02 NOTE — ED Provider Notes (Signed)
CSN: [id number]     Arrival date & time 12/02/13  0124 History   First MD Initiated Contact with Patient 12/02/13 [phone]     Chief Complaint  Patient presents with  . Neck Pain      Patient is a 48 y.o. female presenting with neck pain. The history is provided by the patient.  Neck Pain Pain location:  R side Quality:  Aching Pain radiates to:  R shoulder Pain severity:  Moderate Onset quality:  Gradual Duration:  1 day Timing:  Constant Progression:  Worsening Chronicity:  Recurrent Relieved by:  Nothing Worsened by:  Bending and position Associated symptoms: no chest pain, no fever and no weakness   Pt reports recurrent neck pain  She reports long h/o neck pain, will have episodes every 3 months This episode is similar to prior No recent trauma No recent surgery or spinal manipulation No focal weakness to her upper extremities No new HA No visual changes reported  She reports her pain is unrelieved with home pain meds  Past Medical History  Diagnosis Date  . Sinus infection   . Hypertension   . Arthritis   . Spinal stenosis   . Glaucoma   . Carpal tunnel syndrome   . Depression   . Bipolar 2 disorder   . Obesity   . Arthritis   . Glaucoma   . Leiomyoma of uterus, unspecified   . Sleep apnea    Past Surgical History  Procedure Laterality Date  . Carpal tunnel release      Left Hand  . Abdominal hysterectomy    . Knee surgery Left   . Myelogram    . Cesarean section     Family History  Problem Relation Age of Onset  . Hypertension    . Hypertension Father   . Hyperlipidemia Father    History  Substance Use Topics  . Smoking status: Never Smoker   . Smokeless tobacco: Never Used  . Alcohol Use: No   OB History   Grav Para Term Preterm Abortions TAB SAB Ect Mult Living   3 3 3       3      Review of Systems  Constitutional: Negative for fever.  Eyes: Negative for visual disturbance.  Cardiovascular: Negative for chest pain.  Musculoskeletal:  Positive for neck pain.  Neurological: Negative for weakness.  All other systems reviewed and are negative.     Allergies  Ibuprofen  Home Medications   Prior to Admission medications   Medication Sig Start Date End Date Taking? Authorizing Provider  bimatoprost (LUMIGAN) 0.01 % SOLN Place 1 drop into both eyes at bedtime.   Yes Historical Provider, MD  brimonidine-timolol (COMBIGAN) 0.2-0.5 % ophthalmic solution Place 1 drop into both eyes 2 (two) times daily.   Yes Historical Provider, MD  FLUoxetine (PROZAC) 40 MG capsule Take 1 capsule (40 mg total) by mouth daily. 12/05/12  Yes Nena Polio, PA-C  gabapentin (NEURONTIN) 300 MG capsule Take 600 mg by mouth 3 (three) times daily.   Yes Historical Provider, MD  lisinopril (PRINIVIL,ZESTRIL) 40 MG tablet Take 1 tablet (40 mg total) by mouth daily. 12/05/12  Yes Nena Polio, PA-C  LORazepam (ATIVAN) 0.5 MG tablet Take 1 tablet (0.5 mg total) by mouth daily. 12/05/12  Yes Nena Polio, PA-C  omeprazole (PRILOSEC) 40 MG capsule Take 1 capsule (40 mg total) by mouth daily. 12/05/12  Yes Nena Polio, PA-C  oxyCODONE-acetaminophen (PERCOCET) 10-325 MG per tablet Take 1 tablet by mouth  3 (three) times daily.   Yes Historical Provider, MD  topiramate (TOPAMAX) 100 MG tablet Take 1 tablet (100 mg total) by mouth daily. 01/27/13  Yes Star Age, MD  traMADol (ULTRAM) 50 MG tablet Take 2 tablets (100 mg total) by mouth every 6 (six) hours as needed. 08/10/13  Yes Shelly Bombard, MD  diazepam (VALIUM) 5 MG tablet Take 1 tablet (5 mg total) by mouth every 6 (six) hours as needed for anxiety (spasms). 12/02/13   Sharyon Cable, MD   BP 121/74  Pulse 97  Temp(Src) 97.9 F (36.6 C) (Oral)  Resp 16  SpO2 99% Physical Exam CONSTITUTIONAL: Well developed/well nourished, reading when I enter room, no distress noted HEAD: Normocephalic/atraumatic EYES: EOMI/PERRL, no nystagmus ENMT: Mucous membranes moist NECK: supple no meningeal signs, no  bruits SPINE:entire spine nontender, cervical paraspinal tenderness noted CV: S1/S2 noted LUNGS: Lungs are clear to auscultation bilaterally, no apparent distress ABDOMEN: soft, nontender, no rebound or guarding NEURO:Awake/alert, facies symmetric, no arm or leg drift is noted Equal 5/5 strength with elbow flex/extension, wrist flex/extension in upper extremities and equal hand grips bilaterally Some limitation in right shoulder abduction due to pain but appropriate power noted Equal biceps/brachioradialis reflex in bilateral UE Cranial nerves 3/4/5/6/11/09/08/11/12 tested and intact Pt is ambulatory with cane (baseline) Sensation to light touch intact in all extremities EXTREMITIES: pulses normal, full ROM SKIN: warm, color normal PSYCH: no abnormalities of mood noted   ED Course  Procedures    Pt with h/o spinal stenosis and reports of increased pain similar to tonight's episode She reports pain not relieved with home meds This is similar to ER visit back in June and at that time she was given valium with some improvement Will give another Rx for valium (pt driving,none given here) She has no acute neuro deficits at this time   She is well appearing and I suspect near baseline She already has neuro f/u arranged for later this month Stable for d/c home We discussed strict return precautions MDM   Final diagnoses:  Neck pain    Nursing notes including past medical history and social history reviewed and considered in documentation Previous records reviewed and considered     Sharyon Cable, MD 12/02/13 [phone]

## 2013-12-11 ENCOUNTER — Ambulatory Visit (INDEPENDENT_AMBULATORY_CARE_PROVIDER_SITE_OTHER): Payer: Medicaid Other | Admitting: Adult Health

## 2013-12-11 ENCOUNTER — Encounter: Payer: Self-pay | Admitting: Adult Health

## 2013-12-11 VITALS — BP 145/92 | HR 102 | Ht 66.0 in | Wt 320.0 lb

## 2013-12-11 DIAGNOSIS — M4802 Spinal stenosis, cervical region: Secondary | ICD-10-CM

## 2013-12-11 DIAGNOSIS — R269 Unspecified abnormalities of gait and mobility: Secondary | ICD-10-CM | POA: Insufficient documentation

## 2013-12-11 DIAGNOSIS — R51 Headache: Secondary | ICD-10-CM

## 2013-12-11 DIAGNOSIS — R413 Other amnesia: Secondary | ICD-10-CM

## 2013-12-11 DIAGNOSIS — R32 Unspecified urinary incontinence: Secondary | ICD-10-CM | POA: Insufficient documentation

## 2013-12-11 MED ORDER — TIZANIDINE HCL 4 MG PO TABS: 4.0000 mg | ORAL_TABLET | Freq: Every day | ORAL | Status: DC

## 2013-12-11 NOTE — Patient Instructions (Signed)

## 2013-12-11 NOTE — Progress Notes (Addendum)
PATIENT: Brenda Odonnell DOB: 10/20/1965  REASON FOR VISIT: follow up HISTORY FROM: patient  HISTORY OF PRESENT ILLNESS: Brenda Odonnell is a 48 year female with a history of migraines and OSA on CPAP. She returns today for follow-up. She currently takes Topamax 100 mg daily. She reports that she continues to wake up with a headache and has them throughout the day. She is also complaining of generalized fatigue and incontinence of the bladder. She was place on oxybutynin by her PCP. Her PCP felt that her incontinence was coming from her spinal stenosis. She states this started this year. She states that after she stands for a short period of time her legs become weak. She complains of low back pain. Patient also complains of memory loss and foggy headiness. She states this has been present for some time but got worse around November 2014. Patient states that she does not have a problem with long term memory, it's mainly short term memory. She has a hard time recalling words and numbers. She does not associate this with the Topamax. She has some numbness and tingling in her feet and arms that has been present for over a year. She went to her OBGYN and everything was "normal" there.   HISTORY 07/13/13 (SA):48 year old right-handed woman with an underlying medical history of morbid obesity, depression, history of overdose with admission d/t overdose on benzodiazepine in 2014, recurrent headaches, history of cervical spinal stenosis, who presents for followup consultation of her obstructive sleep apnea. She is unaccompanied today. I first met her on 01/27/2013 at which time we talked about her baseline sleep study from 01/17/2013 which showed a total AHI of 10.5 per hour. Based on her test results and her oxyhemoglobin desaturation nadir of 77% and her history of recurrent headaches, I asked her to come back for a CPAP titration study. She had this on 04/28/2013 and I went over her test results with her in detail  today. Her sleep efficiency was near normal at 89.1% with a latency to sleep of only 1.5 minutes and wake after sleep onset of 39.5 minutes with moderate sleep fragmentation noted. She had an increased percentage of stage II sleep, an increased percentage of deep sleep, and a reduced percentage of REM sleep with a prolonged REM latency. She had moderate periodic leg movements of sleep with a mild arousal index of 6.4. She had CPAP titration from 5-12 cm with reduction of her AHI to 1 event per hour at the final pressure with supine REM sleep achieved. Based on the test results I prescribed CPAP treatment for her at home.  Today I reviewed her compliance him from 06/13/2013 through 07/12/2013 which is the last 30 days, during which time she was CPAP every night except for 4 nights. Percent used days greater than 4 hours was 80%, indicating very good compliance. Average usage was 5 hours and 33 minutes. Residual AHI is 2 per hour but leak is quite high most of the time. 95th percentile of leak was 50.1 L per minute. Her set pressure is 11 cm with EPR of 1. Today, she reports that she no longer wakes up with a migraine, but still has frequent migraines. She also feels that her daytime somnolence is slightly better. She has been on treatment since 1/15. She moves a lot d/t pain. She has leg pain and is no longer on Elavil. She does endorse some RLS symptoms; she continues to be on prozac generic 40 mg daily.  She had  a baseline sleep study on 01/17/2013: Her sleep efficiency was normal at 91.6% with a latency to sleep of 10.5 minutes. Wake after sleep onset was 25 minutes with moderate sleep fragmentation noted. She had a normal percentage of stage I sleep, an increased percentage of stage II sleep, absence of slow-wave sleep and a decreased percentage of REM sleep at 15.3% with a prolonged REM latency. She had mild periodic leg movements at 9.6 per hour with an associated arousal index of 3.1 per hour. She had  intermittent mild to moderate snoring. She had an overall AHI of 10.5 per hour, rising to 42.4 per hour and REM sleep. Her baseline oxygen saturation was 94% and her nadir and REM sleep was 77%. She spent 11 minutes and 36 seconds below the saturation of 90% for the night.  The patient has mild to moderate RLS symptoms and is known to kick while asleep or before falling asleep. There is no family history of RLS or OSA.  She is a restless sleeper and in the morning, the bed is quite disheveled.  She denies cataplexy, sleep paralysis, hypnagogic or hypnopompic hallucinations, or sleep attacks. She does not report any vivid dreams, nightmares, dream enactments, or parasomnias, such as sleep talking or sleep walking. She consumes no caffeinated beverages on any given day.  Her bedroom is usually dark and cool. There is a TV in the bedroom and usually it is not on at night.     REVIEW OF SYSTEMS: Full 14 system review of systems performed and notable only for:  Constitutional: Fatigue Eyes: Loss of vision Ear/Nose/Throat: N/A  Skin: Moles Cardiovascular: N/A  Respiratory: N/A  Gastrointestinal: Nausea Genitourinary: Incontinence of bladder Hematology/Lymphatic: N/A  Endocrine: N/A Musculoskeletal: Joint pain, joint swelling, back pain, aching muscles, muscle cramps, walking difficulty, neck pain, neck stiffness Allergy/Immunology: N/A  Neurological: Memory loss, dizziness, headache, numbness, weakness Psychiatric: Confusion, depression Sleep: Restless leg, insomnia, apnea, daytime sleepiness   ALLERGIES: Allergies  Allergen Reactions  . Ibuprofen Nausea And Vomiting    Due to gird    HOME MEDICATIONS: Outpatient Prescriptions Prior to Visit  Medication Sig Dispense Refill  . bimatoprost (LUMIGAN) 0.01 % SOLN Place 1 drop into both eyes at bedtime.      . brimonidine-timolol (COMBIGAN) 0.2-0.5 % ophthalmic solution Place 1 drop into both eyes 2 (two) times daily.      Marland Kitchen FLUoxetine  (PROZAC) 40 MG capsule Take 1 capsule (40 mg total) by mouth daily.  30 capsule  0  . lisinopril (PRINIVIL,ZESTRIL) 40 MG tablet Take 1 tablet (40 mg total) by mouth daily.      Marland Kitchen LORazepam (ATIVAN) 0.5 MG tablet Take 1 tablet (0.5 mg total) by mouth daily.  30 tablet  0  . omeprazole (PRILOSEC) 40 MG capsule Take 1 capsule (40 mg total) by mouth daily.      Marland Kitchen oxyCODONE-acetaminophen (PERCOCET) 10-325 MG per tablet Take 1 tablet by mouth 3 (three) times daily.      Marland Kitchen topiramate (TOPAMAX) 100 MG tablet Take 1 tablet (100 mg total) by mouth daily.  30 tablet  1  . diazepam (VALIUM) 5 MG tablet Take 1 tablet (5 mg total) by mouth every 6 (six) hours as needed for anxiety (spasms).  10 tablet  0  . gabapentin (NEURONTIN) 300 MG capsule Take 600 mg by mouth 3 (three) times daily.      . traMADol (ULTRAM) 50 MG tablet Take 2 tablets (100 mg total) by mouth every 6 (six) hours  as needed.  40 tablet  2   No facility-administered medications prior to visit.    PAST MEDICAL HISTORY: Past Medical History  Diagnosis Date  . Sinus infection   . Hypertension   . Arthritis   . Spinal stenosis   . Glaucoma   . Carpal tunnel syndrome   . Depression   . Bipolar 2 disorder   . Obesity   . Arthritis   . Glaucoma   . Leiomyoma of uterus, unspecified   . Sleep apnea     PAST SURGICAL HISTORY: Past Surgical History  Procedure Laterality Date  . Carpal tunnel release      Left Hand  . Abdominal hysterectomy    . Knee surgery Left   . Myelogram    . Cesarean section      FAMILY HISTORY: Family History  Problem Relation Age of Onset  . Hypertension    . Hypertension Father   . Hyperlipidemia Father     SOCIAL HISTORY: History   Social History  . Marital Status: Married    Spouse Name: Ilona Sorrel     Number of Children: 3  . Years of Education: N/A   Occupational History  .     Social History Main Topics  . Smoking status: Never Smoker   . Smokeless tobacco: Never Used  . Alcohol  Use: No  . Drug Use: No  . Sexual Activity: Not Currently    Partners: Male    Birth Control/ Protection: Surgical   Other Topics Concern  . Not on file   Social History Narrative   Patient is married to [employer].    Patient has 3 children.    Patient denies caffeine.             PHYSICAL EXAM  Filed Vitals:   12/11/13 1459  BP: 145/92  Pulse: 102  Height: 5' 6"  (1.676 m)  Weight: 320 lb (145.151 kg)   Body mass index is 51.67 kg/(m^2).  Generalized: Well developed, in no acute distress   Neurological examination  Mentation: Alert oriented to time, place, history taking. Follows all commands speech and language fluent. MMSE 23/30 Cranial nerve II-XII: Pupils were equal round reactive to light. Extraocular movements were full, visual field were full on confrontational test. Facial sensation and strength were normal. Head turning and shoulder shrug  were normal and symmetric. Motor: The motor testing reveals 5 over 5 strength of all 4 extremities. Good symmetric motor tone is noted throughout.  Sensory: Sensory testing is intact to soft touch on all 4 extremities. Decreased pinprick in the lower extremities that extends from the toes all the way up the leg. No evidence of extinction is noted.  Coordination: Cerebellar testing reveals good finger-nose-finger  bilaterally.  Gait and station: patient is unable to stand from a sitting position without assistance. She uses a cane to ambulate. She has a wide base stance. Her walking is slow. Tandem gait not attempted. Romberg is negative but unsteady. Reflexes: Deep tendon reflexes 1+ in bilateral upper extremities. Absent at knee.      DIAGNOSTIC DATA (LABS, IMAGING, TESTING) - I reviewed patient records, labs, notes, testing and imaging myself where available.  MRI/MRA  brain 05/19/12:IMPRESSION:  No acute stroke is evident, Unremarkable MRA intracranial circulation.  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 Most notable finding being the L4-5  facet joint degenerative changes.  No evidence of disc herniation causing significant spinal stenosis,  foraminal narrowing or nerve root compression.  Lab Results  Component Value Date   WBC 5.8 11/30/2012   HGB 11.9* 11/30/2012   HCT 35.0* 11/30/2012   MCV 90.0 11/30/2012   PLT 231 11/30/2012      Component Value Date/Time   NA 142 11/30/2012 2246   K 3.4* 11/30/2012 2246   CL 105 11/30/2012 2246   CO2 26 05/19/2012 0550   GLUCOSE 121* 11/30/2012 2246   BUN 16 11/30/2012 2246   CREATININE 0.80 11/30/2012 2246   CALCIUM 9.4 05/19/2012 0550   PROT 7.3 12/02/2012 0615   ALBUMIN 3.3* 12/02/2012 0615   AST 10 12/02/2012 0615   ALT 13 12/02/2012 0615   ALKPHOS 72 12/02/2012 0615   BILITOT 0.3 12/02/2012 0615   GFRNONAA >90 05/19/2012 0550   GFRAA >90 05/19/2012 0550   Lab Results  Component Value Date   CHOL 187 05/19/2012   HDL 39* 05/19/2012   LDLCALC 108* 05/19/2012   TRIG 202* 05/19/2012   CHOLHDL 4.8 05/19/2012   Lab Results  Component Value Date   HGBA1C 5.9* 05/19/2012    Lab Results  Component Value Date   TSH 0.765 12/02/2012      ASSESSMENT AND PLAN 48 y.o. year old female  has a past medical history of Sinus infection; Hypertension; Arthritis; Spinal stenosis; Glaucoma; Carpal tunnel syndrome; Depression; Bipolar 2 disorder; Obesity; Arthritis; Glaucoma; Leiomyoma of uterus, unspecified; and Sleep apnea. here with:  1. headaches 2. OSA on CPAP 3. Spinal Stenosis 4. Abnormality of gait 5. Memory loss  Patient continues to have headaches usually when she wakes up and throughout the day. She currently takes Topamax 100 mg daily. Her compliance for the CPAP is excellent according to prior notes. I will give the patient a prescription for tizanidine 4 mg to take at bedtime. This should help with her headaches as well as her  muscle stiffness. Patient complaining of urinary incontinence that has been evaluated by her PCP and patient was placed on oxybutynin. Patient had a MRI of her lumbar spine in January of 2014 that showed a congenitally narrowed spinal canal with superimposed minimal to mild degenerative changes most notable finding being at L4-5 facet joint degenerative changes. The patient has been seen by a neurosurgeon at: [facility]. Patient states that surgery was an option for her spinal stenosis in the cervical spine however she did not want to proceed with that. I have encouraged the patient to continue to followup with her neurosurgeon. And also suggested that she speak to her primary care provider regarding the urinary incontinence- she currently takes eyes oxybutynin for this. Patient scored a 23/30 on the MMSE. She lost points for word recall. We will continue to monitor this over time. The patient had an MRI of the brain in January 2014 that was normal. Patient should keep her appointment scheduled with Dr. Rexene Alberts in September.  Ward Givens, MSN, NP-C 12/11/2013, 3:04 PM [facility] [address] [place], [place] [address] [phone]  Note: This document was prepared with digital dictation and possible smart phrase technology. Any transcriptional errors that result from this process are unintentional.  I reviewed the above note and documentation by the Nurse Practitioner and agree with the history, physical exam, assessment and plan as outlined above. Star Age, MD, PhD [facility])

## 2013-12-12 ENCOUNTER — Ambulatory Visit: Payer: Medicaid Other | Admitting: Nurse Practitioner

## 2013-12-19 ENCOUNTER — Other Ambulatory Visit: Payer: Self-pay | Admitting: Obstetrics

## 2014-01-09 ENCOUNTER — Encounter (HOSPITAL_COMMUNITY): Payer: Self-pay | Admitting: Emergency Medicine

## 2014-01-09 ENCOUNTER — Emergency Department (HOSPITAL_COMMUNITY)
Admission: EM | Admit: 2014-01-09 | Discharge: 2014-01-10 | Disposition: A | Discharge status: Home / Self Care | Payer: Medicaid Other | Attending: Emergency Medicine | Admitting: Emergency Medicine

## 2014-01-09 ENCOUNTER — Emergency Department (HOSPITAL_COMMUNITY): Payer: Medicaid Other

## 2014-01-09 DIAGNOSIS — I10 Essential (primary) hypertension: Secondary | ICD-10-CM | POA: Diagnosis not present

## 2014-01-09 DIAGNOSIS — G8929 Other chronic pain: Secondary | ICD-10-CM | POA: Diagnosis not present

## 2014-01-09 DIAGNOSIS — M545 Low back pain, unspecified: Secondary | ICD-10-CM | POA: Diagnosis not present

## 2014-01-09 DIAGNOSIS — Z79899 Other long term (current) drug therapy: Secondary | ICD-10-CM | POA: Insufficient documentation

## 2014-01-09 DIAGNOSIS — M129 Arthropathy, unspecified: Secondary | ICD-10-CM | POA: Insufficient documentation

## 2014-01-09 DIAGNOSIS — H409 Unspecified glaucoma: Secondary | ICD-10-CM | POA: Insufficient documentation

## 2014-01-09 DIAGNOSIS — F319 Bipolar disorder, unspecified: Secondary | ICD-10-CM | POA: Diagnosis not present

## 2014-01-09 DIAGNOSIS — Z8742 Personal history of other diseases of the female genital tract: Secondary | ICD-10-CM | POA: Insufficient documentation

## 2014-01-09 DIAGNOSIS — E669 Obesity, unspecified: Secondary | ICD-10-CM | POA: Diagnosis not present

## 2014-01-09 DIAGNOSIS — M549 Dorsalgia, unspecified: Secondary | ICD-10-CM

## 2014-01-09 DIAGNOSIS — R209 Unspecified disturbances of skin sensation: Secondary | ICD-10-CM | POA: Diagnosis not present

## 2014-01-09 DIAGNOSIS — F329 Major depressive disorder, single episode, unspecified: Secondary | ICD-10-CM | POA: Insufficient documentation

## 2014-01-09 DIAGNOSIS — F3289 Other specified depressive episodes: Secondary | ICD-10-CM | POA: Insufficient documentation

## 2014-01-09 LAB — URINALYSIS, ROUTINE W REFLEX MICROSCOPIC
BILIRUBIN URINE: NEGATIVE
Glucose, UA: NEGATIVE mg/dL
Hgb urine dipstick: NEGATIVE
Ketones, ur: NEGATIVE mg/dL
LEUKOCYTES UA: NEGATIVE
Nitrite: NEGATIVE
PH: 5.5 (ref 5.0–8.0)
Protein, ur: NEGATIVE mg/dL
Specific Gravity, Urine: 1.027 (ref 1.005–1.030)
UROBILINOGEN UA: 1 mg/dL (ref 0.0–1.0)

## 2014-01-09 MED ORDER — HYDROMORPHONE HCL PF 1 MG/ML IJ SOLN
1.0000 mg | Freq: Once | INTRAMUSCULAR | Status: AC
  Administered 2014-01-10: 1 mg via INTRAMUSCULAR
  Filled 2014-01-09: qty 1

## 2014-01-09 NOTE — ED Notes (Signed)
Pt. reports progressing  low back pain onset last week , denies injury or fall , no urinary discomfort or hematuria , pain worse with movement or certain positions .

## 2014-01-09 NOTE — ED Notes (Signed)
Patient transported to X-ray 

## 2014-01-09 NOTE — ED Provider Notes (Signed)
CSN: [id number]     Arrival date & time 01/09/14  2200 History  This chart was scribed for Harvie Heck, PA-C working with Charlesetta Shanks, MD by Randa Evens, ED Scribe. This patient was seen in room TR11C/TR11C and the patient's care was started at 11:55 PM.    Chief Complaint  Patient presents with  . Back Pain   HPI Comments: Brenda Odonnell is a 48 y.o. female who presents with a Hx of chronic back pain to the Emergency Department complaining of progressively worsening intermittent low back pain onset 1 week prior. She states the pain radiates down her right leg and causing her foot to go numb. She states she was recently visited [place], last week, and was told to continue to take her pain medications. She state she has uncontrolled back pain flares ever 3-4 months. She states that she recently fell a few weeks ago and think that may have worsened her back pain. She state she has been taking oxycodone with no relief. She denies injury or trauma to the back.  Patient is a 48 y.o. female presenting with back pain. The history is provided by the patient. No language interpreter was used.  Back Pain Associated symptoms: numbness   Associated symptoms: no abdominal pain and no fever       Past Medical History  Diagnosis Date  . Sinus infection   . Hypertension   . Arthritis   . Spinal stenosis   . Glaucoma   . Carpal tunnel syndrome   . Depression   . Bipolar 2 disorder   . Obesity   . Arthritis   . Glaucoma   . Leiomyoma of uterus, unspecified   . Sleep apnea    Past Surgical History  Procedure Laterality Date  . Carpal tunnel release      Left Hand  . Abdominal hysterectomy    . Knee surgery Left   . Myelogram    . Cesarean section     Family History  Problem Relation Age of Onset  . Hypertension    . Hypertension Father   . Hyperlipidemia Father    History  Substance Use Topics  . Smoking status: Never Smoker   . Smokeless tobacco: Never Used  . Alcohol Use: No    OB History   Grav Para Term Preterm Abortions TAB SAB Ect Mult Living   3 3 3       3      Review of Systems  Constitutional: Negative for fever and chills.  Gastrointestinal: Negative for abdominal pain, diarrhea and constipation.  Genitourinary: Negative for difficulty urinating.  Musculoskeletal: Positive for back pain and gait problem.  Skin: Negative for wound.  Neurological: Positive for numbness.   Allergies  Ibuprofen  Home Medications   Prior to Admission medications   Medication Sig Start Date End Date Taking? Authorizing Provider  bimatoprost (LUMIGAN) 0.01 % SOLN Place 1 drop into both eyes at bedtime.   Yes Historical Provider, MD  brimonidine-timolol (COMBIGAN) 0.2-0.5 % ophthalmic solution Place 1 drop into both eyes 2 (two) times daily.   Yes Historical Provider, MD  diclofenac sodium (VOLTAREN) 1 % GEL Apply 2 g topically daily as needed. For knee and foot pain   Yes Historical Provider, MD  FLUoxetine (PROZAC) 40 MG capsule Take 1 capsule (40 mg total) by mouth daily. 12/05/12  Yes Nena Polio, PA-C  furosemide (LASIX) 40 MG tablet Take 40 mg by mouth daily.   Yes Historical Provider, MD  gabapentin (NEURONTIN)  600 MG tablet Take 600 mg by mouth 3 (three) times daily.   Yes Historical Provider, MD  lisinopril (PRINIVIL,ZESTRIL) 40 MG tablet Take 1 tablet (40 mg total) by mouth daily. 12/05/12  Yes Nena Polio, PA-C  LORazepam (ATIVAN) 0.5 MG tablet Take 0.5 mg by mouth 3 (three) times daily. Take three times every day per patient 12/05/12  Yes Nena Polio, PA-C  omeprazole (PRILOSEC) 40 MG capsule Take 1 capsule (40 mg total) by mouth daily. 12/05/12  Yes Nena Polio, PA-C  ondansetron (ZOFRAN) 4 MG tablet Take 4 mg by mouth every 8 (eight) hours as needed for nausea or vomiting.   Yes Historical Provider, MD  oxyCODONE-acetaminophen (PERCOCET) 10-325 MG per tablet Take 1 tablet by mouth 3 (three) times daily. Take three times every day per patient   Yes Historical  Provider, MD  phentermine 37.5 MG capsule Take 37.5 mg by mouth daily.   Yes Historical Provider, MD  tiZANidine (ZANAFLEX) 4 MG tablet Take 1 tablet (4 mg total) by mouth at bedtime. 12/11/13  Yes Ward Givens, NP  topiramate (TOPAMAX) 100 MG tablet Take 1 tablet (100 mg total) by mouth daily. 01/27/13  Yes Star Age, MD   Triage Vitals: BP 123/73  Pulse 88  Temp(Src) 97.3 F (36.3 C) (Oral)  Resp 22  Ht 5\' 6"  (1.676 m)  Wt 320 lb (145.151 kg)  BMI 51.67 kg/m2  SpO2 97%  Physical Exam  Nursing note and vitals reviewed. Constitutional: She is oriented to person, place, and time. She appears well-developed and well-nourished.  Non-toxic appearance. She does not have a sickly appearance. She does not appear ill. No distress.  Obese female  HENT:  Head: Normocephalic and atraumatic.  Eyes: Conjunctivae and EOM are normal.  Neck: Neck supple.  Musculoskeletal: Normal range of motion.  Palpation of right SI joint recreates discomfort in lower extremities. Good sensation and normal strength in bilateral lower extremities.  Neurological: She is alert and oriented to person, place, and time.  Skin: Skin is warm and dry.  Psychiatric: She has a normal mood and affect. Her behavior is normal.    ED Course  Procedures (including critical care time) DIAGNOSTIC STUDIES: Oxygen Saturation is 97% on RA, normal by my interpretation.    COORDINATION OF CARE:    Labs Review Labs Reviewed  URINALYSIS, ROUTINE W REFLEX MICROSCOPIC    Imaging Review Dg Lumbar Spine Complete  01/09/2014   CLINICAL DATA:  Chronic low back pain radiating down the right leg.  EXAM: LUMBAR SPINE - COMPLETE 4+ VIEW  COMPARISON:  01/12/2011.  MRI spine 05/09/2012  FINDINGS: There is no evidence of lumbar spine fracture. Alignment is normal. Intervertebral disc spaces are maintained.  IMPRESSION: Negative.   Electronically Signed   By: Lucienne Capers M.D.   On: 01/09/2014 23:28     EKG Interpretation None       MDM   Final diagnoses:  Chronic back pain   Patient presents with low back pain, followed by neurosurgery. Similar to previous flares. Will treat with pain medication and followup with neurosurgeon. Negative x-rays. Negative UA. Discussed lab results, imaging results, and treatment plan with the patient. Return precautions given. Reports understanding and no other concerns at this time.  Patient is stable for discharge at this time.  Meds given in ED:  Medications  HYDROmorphone (DILAUDID) injection 1 mg (1 mg Intramuscular Given 01/10/14 0013)    New Prescriptions   OXYCODONE (ROXICODONE) 5 MG IMMEDIATE RELEASE TABLET  Take 1 tablet (5 mg total) by mouth every 4 (four) hours as needed for severe pain.     I personally performed the services described in this documentation, which was scribed in my presence. The recorded information has been reviewed and is accurate.       Harvie Heck, PA-C 01/10/14 0021

## 2014-01-10 MED ORDER — OXYCODONE HCL 5 MG PO TABS: 5.0000 mg | ORAL_TABLET | ORAL | Status: DC | PRN

## 2014-01-10 NOTE — Discharge Instructions (Signed)
Call for a follow up appointment with a Family or Primary Care Provider.  Return if Symptoms worsen.   Take medication as prescribed.  Do not operate heavy machinery or drink alcohol while taking narcotic pain medication.

## 2014-01-15 ENCOUNTER — Ambulatory Visit: Payer: Medicaid Other | Admitting: Neurology

## 2014-01-15 ENCOUNTER — Encounter (INDEPENDENT_AMBULATORY_CARE_PROVIDER_SITE_OTHER): Payer: Self-pay

## 2014-01-15 ENCOUNTER — Telehealth: Payer: Self-pay | Admitting: Neurology

## 2014-01-15 NOTE — Telephone Encounter (Signed)
Patient no showed for an appointment today, 01/15/2014, at 1500.

## 2014-01-23 ENCOUNTER — Ambulatory Visit: Payer: Medicaid Other | Attending: Neurosurgery | Admitting: Occupational Therapy

## 2014-02-06 ENCOUNTER — Other Ambulatory Visit: Payer: Self-pay | Admitting: Gastroenterology

## 2014-02-06 DIAGNOSIS — R1310 Dysphagia, unspecified: Secondary | ICD-10-CM

## 2014-02-09 ENCOUNTER — Other Ambulatory Visit: Payer: Self-pay

## 2014-02-13 ENCOUNTER — Ambulatory Visit (INDEPENDENT_AMBULATORY_CARE_PROVIDER_SITE_OTHER): Payer: Medicaid Other | Admitting: Obstetrics

## 2014-02-13 ENCOUNTER — Encounter: Payer: Self-pay | Admitting: Obstetrics

## 2014-02-13 VITALS — BP 116/80 | HR 87 | Temp 99.0°F | Ht 66.0 in

## 2014-02-13 DIAGNOSIS — R102 Pelvic and perineal pain unspecified side: Secondary | ICD-10-CM | POA: Insufficient documentation

## 2014-02-13 DIAGNOSIS — Z0000 Encounter for general adult medical examination without abnormal findings: Secondary | ICD-10-CM

## 2014-02-13 DIAGNOSIS — Z01419 Encounter for gynecological examination (general) (routine) without abnormal findings: Secondary | ICD-10-CM

## 2014-02-14 ENCOUNTER — Other Ambulatory Visit: Payer: Self-pay | Admitting: Obstetrics

## 2014-02-14 DIAGNOSIS — B9689 Other specified bacterial agents as the cause of diseases classified elsewhere: Secondary | ICD-10-CM

## 2014-02-14 DIAGNOSIS — N760 Acute vaginitis: Principal | ICD-10-CM

## 2014-02-14 LAB — POCT URINALYSIS DIPSTICK
BILIRUBIN UA: NEGATIVE
Blood, UA: NEGATIVE
Glucose, UA: NEGATIVE
KETONES UA: NEGATIVE
LEUKOCYTES UA: NEGATIVE
Nitrite, UA: NEGATIVE
Protein, UA: NEGATIVE
Spec Grav, UA: 1.01
Urobilinogen, UA: >=8
pH, UA: 7

## 2014-02-14 LAB — WET PREP BY MOLECULAR PROBE
CANDIDA SPECIES: NEGATIVE
Gardnerella vaginalis: POSITIVE — AB
TRICHOMONAS VAG: NEGATIVE

## 2014-02-14 LAB — HIV ANTIBODY (ROUTINE TESTING W REFLEX): HIV 1&2 Ab, 4th Generation: NONREACTIVE

## 2014-02-14 LAB — HEPATITIS C ANTIBODY: HCV Ab: NEGATIVE

## 2014-02-14 LAB — HEPATITIS B SURFACE ANTIGEN: Hepatitis B Surface Ag: NEGATIVE

## 2014-02-14 LAB — RPR

## 2014-02-14 MED ORDER — METRONIDAZOLE 500 MG PO TABS
500.0000 mg | ORAL_TABLET | Freq: Two times a day (BID) | ORAL | Status: DC
Start: 2014-02-14 — End: 2014-04-03

## 2014-02-15 ENCOUNTER — Encounter: Payer: Self-pay | Admitting: Obstetrics

## 2014-02-15 NOTE — Progress Notes (Signed)
Subjective:     Brenda Odonnell is a 48 y.o. female here for a routine exam.  Current complaints: Pelvic pain with intercourse and vaginal dryness.    Personal health questionnaire:  Is patient Ashkenazi Jewish, have a family history of breast and/or ovarian cancer: no Is there a family history of uterine cancer diagnosed at age < 23, gastrointestinal cancer, urinary tract cancer, family member who is a Field seismologist syndrome-associated carrier: no Is the patient overweight and hypertensive, family history of diabetes, personal history of gestational diabetes or PCOS: no Is patient over 60, have PCOS,  family history of premature CHD under age 60, diabetes, smoke, have hypertension or peripheral artery disease:  no At any time, has a partner hit, kicked or otherwise hurt or frightened you?: no Over the past 2 weeks, have you felt down, depressed or hopeless?: no Over the past 2 weeks, have you felt little interest or pleasure in doing things?:no   Gynecologic History No LMP recorded. Patient has had a hysterectomy. Contraception: status post hysterectomy Last Pap: 2000. Results were: normal Last mammogram: 2015. Results were: normal  Obstetric History OB History  Gravida Para Term Preterm AB SAB TAB Ectopic Multiple Living  3 3 3       3     # Outcome Date GA Lbr Len/2nd Weight Sex Delivery Anes PTL Lv  3 TRM 09/10/89 [redacted]w[redacted]d  7 lb 10 oz (3.459 kg) F LTCS Spinal N Y  2 TRM 10/05/86 [redacted]w[redacted]d  7 lb 14 oz (3.572 kg) F LTCS Spinal N Y  1 TRM 06/23/84 [redacted]w[redacted]d  9 lb (4.082 kg) F LTCS Spinal N Y      Past Medical History  Diagnosis Date  . Sinus infection   . Hypertension   . Arthritis   . Spinal stenosis   . Glaucoma   . Carpal tunnel syndrome   . Depression   . Bipolar 2 disorder   . Obesity   . Arthritis   . Glaucoma   . Leiomyoma of uterus, unspecified   . Sleep apnea     Past Surgical History  Procedure Laterality Date  . Carpal tunnel release      Left Hand  . Abdominal hysterectomy     . Knee surgery Left   . Myelogram    . Cesarean section      Current outpatient prescriptions:bimatoprost (LUMIGAN) 0.01 % SOLN, Place 1 drop into both eyes at bedtime., Disp: , Rfl: ;  brimonidine-timolol (COMBIGAN) 0.2-0.5 % ophthalmic solution, Place 1 drop into both eyes 2 (two) times daily., Disp: , Rfl: ;  diclofenac sodium (VOLTAREN) 1 % GEL, Apply 2 g topically daily as needed. For knee and foot pain, Disp: , Rfl:  FLUoxetine (PROZAC) 40 MG capsule, Take 1 capsule (40 mg total) by mouth daily., Disp: 30 capsule, Rfl: 0;  furosemide (LASIX) 40 MG tablet, Take 40 mg by mouth daily., Disp: , Rfl: ;  gabapentin (NEURONTIN) 600 MG tablet, Take 600 mg by mouth 3 (three) times daily., Disp: , Rfl: ;  lisinopril (PRINIVIL,ZESTRIL) 40 MG tablet, Take 1 tablet (40 mg total) by mouth daily., Disp: , Rfl:  LORazepam (ATIVAN) 0.5 MG tablet, Take 0.5 mg by mouth 3 (three) times daily. Take three times every day per patient, Disp: , Rfl: ;  omeprazole (PRILOSEC) 40 MG capsule, Take 1 capsule (40 mg total) by mouth daily., Disp: , Rfl: ;  ondansetron (ZOFRAN) 4 MG tablet, Take 4 mg by mouth every 8 (eight) hours as needed for  nausea or vomiting., Disp: , Rfl:  oxyCODONE (ROXICODONE) 5 MG immediate release tablet, Take 1 tablet (5 mg total) by mouth every 4 (four) hours as needed for severe pain., Disp: 6 tablet, Rfl: 0;  phentermine 37.5 MG capsule, Take 37.5 mg by mouth daily., Disp: , Rfl: ;  tiZANidine (ZANAFLEX) 4 MG tablet, Take 1 tablet (4 mg total) by mouth at bedtime., Disp: 30 tablet, Rfl: 0 topiramate (TOPAMAX) 100 MG tablet, Take 1 tablet (100 mg total) by mouth daily., Disp: 30 tablet, Rfl: 1;  metroNIDAZOLE (FLAGYL) 500 MG tablet, Take 1 tablet (500 mg total) by mouth 2 (two) times daily., Disp: 14 tablet, Rfl: 2 Allergies  Allergen Reactions  . Ibuprofen Nausea And Vomiting    Due to gird    History  Substance Use Topics  . Smoking status: Never Smoker   . Smokeless tobacco: Never Used  .  Alcohol Use: No    Family History  Problem Relation Age of Onset  . Hypertension    . Hypertension Father   . Hyperlipidemia Father       Review of Systems  Constitutional: negative for fatigue and weight loss Respiratory: negative for cough and wheezing Cardiovascular: negative for chest pain, fatigue and palpitations Gastrointestinal: negative for abdominal pain and change in bowel habits Musculoskeletal:negative for myalgias Neurological: negative for gait problems and tremors Behavioral/Psych: negative for abusive relationship, depression Endocrine: negative for temperature intolerance   Genitourinary:negative for abnormal menstrual periods, genital lesions, hot flashes, sexual problems and vaginal discharge Integument/breast: negative for breast lump, breast tenderness, nipple discharge and skin lesion(s)    Objective:       BP 116/80  Pulse 87  Temp(Src) 99 F (37.2 C)  Ht 5\' 6"  (1.676 m) General:   alert  Skin:   no rash or abnormalities  Lungs:   clear to auscultation bilaterally  Heart:   regular rate and rhythm, S1, S2 normal, no murmur, click, rub or gallop  Breasts:   normal without suspicious masses, skin or nipple changes or axillary nodes  Abdomen:  normal findings: no organomegaly, soft, non-tender and no hernia  Pelvis:  External genitalia: normal general appearance Urinary system: urethral meatus normal and bladder without fullness, nontender Vaginal: normal without tenderness, induration or masses Cervix:  absent Adnexa: No fullness or tenderness, but exam compromised by obesity.  Uterus: absent   Lab Review Urine pregnancy test Labs reviewed yes Radiologic studies reviewed yes    Assessment:    Healthy female exam.  S/P Hysterectomy for fibroids in 2000.  Painful intercourse and vaginal dryness.   Plan:    Education reviewed: calcium supplements, low fat, low cholesterol diet, self breast exams and weight bearing exercise. Follow up in:  2 weeks. Ultrasound ordered.   No orders of the defined types were placed in this encounter.   Orders Placed This Encounter  Procedures  . WET PREP BY MOLECULAR PROBE  . US Transvaginal Non-OB    Plb/barbara   Pelvis only per office   [phone]   Medicaid    Standing Status: Future     Number of Occurrences:      Standing Expiration Date: 04/16/2015    Order Specific Question:  Reason for Exam (SYMPTOM  OR DIAGNOSIS REQUIRED)    Answer:  pelvic pain. s/p hysterectomy.    Order Specific Question:  Preferred imaging location?    Answer:  [facility]  . HIV antibody  . Hepatitis B surface antigen  . RPR  .  Hepatitis C antibody  . POCT urinalysis dipstick

## 2014-02-19 ENCOUNTER — Ambulatory Visit (HOSPITAL_COMMUNITY): Admission: RE | Admit: 2014-02-19 | Payer: Medicaid Other | Source: Ambulatory Visit

## 2014-03-05 ENCOUNTER — Encounter: Payer: Self-pay | Admitting: Obstetrics

## 2014-04-02 ENCOUNTER — Ambulatory Visit (INDEPENDENT_AMBULATORY_CARE_PROVIDER_SITE_OTHER): Payer: Medicaid Other | Admitting: Obstetrics

## 2014-04-02 ENCOUNTER — Encounter: Payer: Self-pay | Admitting: Obstetrics

## 2014-04-02 VITALS — BP 134/92 | HR 94 | Wt 321.0 lb

## 2014-04-02 DIAGNOSIS — B373 Candidiasis of vulva and vagina: Secondary | ICD-10-CM

## 2014-04-02 DIAGNOSIS — B369 Superficial mycosis, unspecified: Secondary | ICD-10-CM

## 2014-04-02 DIAGNOSIS — B3731 Acute candidiasis of vulva and vagina: Secondary | ICD-10-CM

## 2014-04-02 MED ORDER — CLOTRIMAZOLE 1 % EX CREA: 1.0000 "application " | TOPICAL_CREAM | Freq: Two times a day (BID) | CUTANEOUS | Status: DC

## 2014-04-02 MED ORDER — FLUCONAZOLE 200 MG PO TABS: 200.0000 mg | ORAL_TABLET | ORAL | Status: DC

## 2014-04-02 NOTE — Progress Notes (Signed)
[name], female   DOB: [birth year], 48 y.o.   MRN: [id number]  Chief Complaint  Patient presents with  . Vaginitis    HPI Brenda Odonnell is a 48 y.o. female.  Itching and tenderness of vagina, mainly on the outside.  HPI  Past Medical History  Diagnosis Date  . Sinus infection   . Hypertension   . Arthritis   . Spinal stenosis   . Glaucoma   . Carpal tunnel syndrome   . Depression   . Bipolar 2 disorder   . Obesity   . Arthritis   . Glaucoma   . Leiomyoma of uterus, unspecified   . Sleep apnea     Past Surgical History  Procedure Laterality Date  . Carpal tunnel release      Left Hand  . Abdominal hysterectomy    . Knee surgery Left   . Myelogram    . Cesarean section      Family History  Problem Relation Age of Onset  . Hypertension    . Hypertension Father   . Hyperlipidemia Father     Social History History  Substance Use Topics  . Smoking status: Never Smoker   . Smokeless tobacco: Never Used  . Alcohol Use: No    Allergies  Allergen Reactions  . Ibuprofen Nausea And Vomiting    Due to gird    Current Outpatient Prescriptions  Medication Sig Dispense Refill  . Lurasidone HCl (LATUDA) 20 MG TABS Take by mouth.    . bimatoprost (LUMIGAN) 0.01 % SOLN Place into both eyes at bedtime.     . brimonidine-timolol (COMBIGAN) 0.2-0.5 % ophthalmic solution Place 1 drop into both eyes 2 (two) times daily.    . clotrimazole (LOTRIMIN) 1 % cream Apply 1 application topically 2 (two) times daily. 113 g 2  . diclofenac sodium (VOLTAREN) 1 % GEL Apply 2 g topically daily as needed. For knee and foot pain    . fluconazole (DIFLUCAN) 200 MG tablet Take 1 tablet (200 mg total) by mouth every other day. 3 tablet 2  . FLUoxetine (PROZAC) 40 MG capsule Take 1 capsule (40 mg total) by mouth daily. 30 capsule 0  . furosemide (LASIX) 40 MG tablet Take 40 mg by mouth daily.    Marland Kitchen gabapentin (NEURONTIN) 600 MG tablet Take 600 mg by mouth 3 (three) times  daily.    Marland Kitchen lisinopril (PRINIVIL,ZESTRIL) 40 MG tablet Take 1 tablet (40 mg total) by mouth daily.    Marland Kitchen LORazepam (ATIVAN) 0.5 MG tablet Take 0.5 mg by mouth 3 (three) times daily. Take three times every day per patient    . metroNIDAZOLE (FLAGYL) 500 MG tablet Take 1 tablet (500 mg total) by mouth 2 (two) times daily. 14 tablet 2  . omeprazole (PRILOSEC) 40 MG capsule Take 1 capsule (40 mg total) by mouth daily.    . ondansetron (ZOFRAN) 4 MG tablet Take 4 mg by mouth every 8 (eight) hours as needed for nausea or vomiting.    Marland Kitchen oxyCODONE (ROXICODONE) 5 MG immediate release tablet Take 1 tablet (5 mg total) by mouth every 4 (four) hours as needed for severe pain. 6 tablet 0  . phentermine 37.5 MG capsule Take 37.5 mg by mouth daily.    Marland Kitchen tiZANidine (ZANAFLEX) 4 MG tablet Take 1 tablet (4 mg total) by mouth at bedtime. 30 tablet 0  . topiramate (TOPAMAX) 100 MG tablet Take 1 tablet (100 mg total) by mouth daily. 30 tablet 1  No current facility-administered medications for this visit.    Review of Systems Review of Systems Constitutional: negative for fatigue and weight loss Respiratory: negative for cough and wheezing Cardiovascular: negative for chest pain, fatigue and palpitations Gastrointestinal: negative for abdominal pain and change in bowel habits Genitourinary: vulvovaginal itching Integument/breast: negative for nipple discharge Musculoskeletal:negative for myalgias Neurological: negative for gait problems and tremors Behavioral/Psych: negative for abusive relationship, depression Endocrine: negative for temperature intolerance     Blood pressure 134/92, pulse 94, weight 321 lb (145.605 kg).  Physical Exam Physical Exam General:   alert  Skin:   no rash or abnormalities  Lungs:   clear to auscultation bilaterally  Heart:   regular rate and rhythm, S1, S2 normal, no murmur, click, rub or gallop  Breasts:   normal without suspicious masses, skin or nipple changes or  axillary nodes  Abdomen:  normal findings: no organomegaly, soft, non-tender and no hernia  Pelvis:  External genitalia: normal general appearance Urinary system: urethral meatus normal and bladder without fullness, nontender Vaginal: normal without tenderness, induration or masses Cervix: absent Adnexa: normal bimanual exam Uterus: absent      Data Reviewed Labs  Assessment    Vulvovaginitis.  Probably Candida.     Plan    Wet prep ordered  Clotrimazole cream Rx externally Diflucan Rx  F/U prn   Orders Placed This Encounter  Procedures  . WET PREP BY MOLECULAR PROBE   Meds ordered this encounter  Medications  . Lurasidone HCl (LATUDA) 20 MG TABS    Sig: Take by mouth.  . DISCONTD: clotrimazole (LOTRIMIN) 1 % cream    Sig: Apply 1 application topically 2 (two) times daily.    Dispense:  30 g    Refill:  0  . fluconazole (DIFLUCAN) 200 MG tablet    Sig: Take 1 tablet (200 mg total) by mouth every other day.    Dispense:  3 tablet    Refill:  2  . clotrimazole (LOTRIMIN) 1 % cream    Sig: Apply 1 application topically 2 (two) times daily.    Dispense:  113 g    Refill:  2        HARPER,CHARLES A 04/02/2014, 4:17 PM

## 2014-04-03 ENCOUNTER — Other Ambulatory Visit: Payer: Self-pay | Admitting: Obstetrics

## 2014-04-03 DIAGNOSIS — N760 Acute vaginitis: Principal | ICD-10-CM

## 2014-04-03 DIAGNOSIS — B9689 Other specified bacterial agents as the cause of diseases classified elsewhere: Secondary | ICD-10-CM | POA: Insufficient documentation

## 2014-04-03 LAB — WET PREP BY MOLECULAR PROBE
Candida species: NEGATIVE
Gardnerella vaginalis: POSITIVE — AB
Trichomonas vaginosis: NEGATIVE

## 2014-04-03 MED ORDER — METRONIDAZOLE 500 MG PO TABS: 500.0000 mg | ORAL_TABLET | Freq: Two times a day (BID) | ORAL | Status: DC

## 2014-04-04 ENCOUNTER — Inpatient Hospital Stay: Admission: RE | Admit: 2014-04-04 | Payer: Self-pay | Source: Ambulatory Visit

## 2014-04-19 ENCOUNTER — Emergency Department (HOSPITAL_COMMUNITY)
Admission: EM | Admit: 2014-04-19 | Discharge: 2014-04-19 | Disposition: A | Discharge status: Home / Self Care | Payer: Medicaid Other | Attending: Emergency Medicine | Admitting: Emergency Medicine

## 2014-04-19 ENCOUNTER — Encounter (HOSPITAL_COMMUNITY): Payer: Self-pay | Admitting: Emergency Medicine

## 2014-04-19 DIAGNOSIS — Z8542 Personal history of malignant neoplasm of other parts of uterus: Secondary | ICD-10-CM | POA: Insufficient documentation

## 2014-04-19 DIAGNOSIS — Z8709 Personal history of other diseases of the respiratory system: Secondary | ICD-10-CM | POA: Insufficient documentation

## 2014-04-19 DIAGNOSIS — I10 Essential (primary) hypertension: Secondary | ICD-10-CM | POA: Insufficient documentation

## 2014-04-19 DIAGNOSIS — M791 Myalgia: Secondary | ICD-10-CM | POA: Diagnosis not present

## 2014-04-19 DIAGNOSIS — H409 Unspecified glaucoma: Secondary | ICD-10-CM | POA: Insufficient documentation

## 2014-04-19 DIAGNOSIS — M7918 Myalgia, other site: Secondary | ICD-10-CM

## 2014-04-19 DIAGNOSIS — Z79899 Other long term (current) drug therapy: Secondary | ICD-10-CM | POA: Insufficient documentation

## 2014-04-19 DIAGNOSIS — F319 Bipolar disorder, unspecified: Secondary | ICD-10-CM | POA: Insufficient documentation

## 2014-04-19 DIAGNOSIS — R52 Pain, unspecified: Secondary | ICD-10-CM

## 2014-04-19 DIAGNOSIS — R531 Weakness: Secondary | ICD-10-CM

## 2014-04-19 DIAGNOSIS — E669 Obesity, unspecified: Secondary | ICD-10-CM | POA: Diagnosis not present

## 2014-04-19 DIAGNOSIS — G35 Multiple sclerosis: Secondary | ICD-10-CM | POA: Diagnosis present

## 2014-04-19 LAB — CBC WITH DIFFERENTIAL/PLATELET
Basophils Absolute: 0 10*3/uL (ref 0.0–0.1)
Basophils Relative: 0 % (ref 0–1)
EOS PCT: 1 % (ref 0–5)
Eosinophils Absolute: 0.1 10*3/uL (ref 0.0–0.7)
HEMATOCRIT: 33.8 % — AB (ref 36.0–46.0)
Hemoglobin: 11.3 g/dL — ABNORMAL LOW (ref 12.0–15.0)
LYMPHS ABS: 2.4 10*3/uL (ref 0.7–4.0)
Lymphocytes Relative: 60 % — ABNORMAL HIGH (ref 12–46)
MCH: 31 pg (ref 26.0–34.0)
MCHC: 33.4 g/dL (ref 30.0–36.0)
MCV: 92.9 fL (ref 78.0–100.0)
Monocytes Absolute: 0.2 10*3/uL (ref 0.1–1.0)
Monocytes Relative: 5 % (ref 3–12)
Neutro Abs: 1.4 10*3/uL — ABNORMAL LOW (ref 1.7–7.7)
Neutrophils Relative %: 34 % — ABNORMAL LOW (ref 43–77)
Platelets: 238 10*3/uL (ref 150–400)
RBC: 3.64 MIL/uL — ABNORMAL LOW (ref 3.87–5.11)
RDW: 13.4 % (ref 11.5–15.5)
WBC: 4 10*3/uL (ref 4.0–10.5)

## 2014-04-19 LAB — BASIC METABOLIC PANEL
Anion gap: 11 (ref 5–15)
BUN: 11 mg/dL (ref 6–23)
CO2: 26 meq/L (ref 19–32)
Calcium: 9.2 mg/dL (ref 8.4–10.5)
Chloride: 106 mEq/L (ref 96–112)
Creatinine, Ser: 0.75 mg/dL (ref 0.50–1.10)
GFR calc Af Amer: >90 mL/min (ref >90)
GFR calc non Af Amer: >90 mL/min (ref >90)
GLUCOSE: 111 mg/dL — AB (ref 70–99)
Potassium: 4.5 mEq/L (ref 3.7–5.3)
Sodium: 143 mEq/L (ref 137–147)

## 2014-04-19 MED ORDER — METHOCARBAMOL 500 MG PO TABS: 1000.0000 mg | ORAL_TABLET | Freq: Four times a day (QID) | ORAL | Status: DC | PRN

## 2014-04-19 MED ORDER — HYDROMORPHONE HCL 1 MG/ML IJ SOLN
0.5000 mg | Freq: Once | INTRAMUSCULAR | Status: AC
  Administered 2014-04-19: 0.5 mg via INTRAVENOUS
  Filled 2014-04-19: qty 1

## 2014-04-19 MED ORDER — ONDANSETRON HCL 4 MG/2ML IJ SOLN
4.0000 mg | Freq: Once | INTRAMUSCULAR | Status: AC
  Administered 2014-04-19: 4 mg via INTRAVENOUS
  Filled 2014-04-19: qty 2

## 2014-04-19 MED ORDER — MORPHINE SULFATE 4 MG/ML IJ SOLN
4.0000 mg | Freq: Once | INTRAMUSCULAR | Status: AC
  Administered 2014-04-19: 4 mg via INTRAVENOUS
  Filled 2014-04-19: qty 1

## 2014-04-19 NOTE — ED Provider Notes (Signed)
CSN: [id number]     Arrival date & time 04/19/14  2125 History   First MD Initiated Contact with Patient 04/19/14 2144     Chief Complaint  Patient presents with  . Multiple Sclerosis     (Consider location/radiation/quality/duration/timing/severity/associated sxs/prior Treatment) HPI   Brenda Odonnell is a 48 y.o. female complaining of worsening pain, 10 out of 10 to right anterior neck and low back consistent with prior MS flares. Patient does not outpatient neurologist, she is to follow [facility] neurology but does not anymore. States she's been taking gabapentin and Percocet for pain control with little relief. She is ambulating with a cane which she does intermittently when the pain is severe. Patient denies headache, change in vision, dysarthria, ataxia, chest pain, shortness of breath, abdominal pain vomiting, change in bowel or bladder habits. Endorses nausea on review of systems  Past Medical History  Diagnosis Date  . Sinus infection   . Hypertension   . Arthritis   . Spinal stenosis   . Glaucoma   . Carpal tunnel syndrome   . Depression   . Bipolar 2 disorder   . Obesity   . Arthritis   . Glaucoma   . Leiomyoma of uterus, unspecified   . Sleep apnea   . MS (multiple sclerosis)    Past Surgical History  Procedure Laterality Date  . Carpal tunnel release      Left Hand  . Abdominal hysterectomy    . Knee surgery Left   . Myelogram    . Cesarean section     Family History  Problem Relation Age of Onset  . Hypertension    . Hypertension Father   . Hyperlipidemia Father    History  Substance Use Topics  . Smoking status: Never Smoker   . Smokeless tobacco: Never Used  . Alcohol Use: No   OB History    Gravida Para Term Preterm AB TAB SAB Ectopic Multiple Living   3 3 3       3      Review of Systems  10 systems reviewed and found to be negative, except as noted in the HPI.   Allergies  Ibuprofen  Home Medications   Prior to Admission medications    Medication Sig Start Date End Date Taking? Authorizing Provider  bimatoprost (LUMIGAN) 0.01 % SOLN Place 1 drop into both eyes at bedtime.    Yes Historical Provider, MD  brimonidine-timolol (COMBIGAN) 0.2-0.5 % ophthalmic solution Place 1 drop into both eyes 2 (two) times daily.   Yes Historical Provider, MD  clotrimazole (LOTRIMIN) 1 % cream Apply 1 application topically 2 (two) times daily. 04/02/14  Yes Shelly Bombard, MD  diclofenac sodium (VOLTAREN) 1 % GEL Apply 2 g topically daily as needed. For knee and foot pain   Yes Historical Provider, MD  FLUoxetine (PROZAC) 40 MG capsule Take 1 capsule (40 mg total) by mouth daily. 12/05/12  Yes Nena Polio, PA-C  furosemide (LASIX) 40 MG tablet Take 40 mg by mouth daily.   Yes Historical Provider, MD  gabapentin (NEURONTIN) 600 MG tablet Take 600 mg by mouth 3 (three) times daily.   Yes Historical Provider, MD  lisinopril (PRINIVIL,ZESTRIL) 40 MG tablet Take 1 tablet (40 mg total) by mouth daily. 12/05/12  Yes Nena Polio, PA-C  LORazepam (ATIVAN) 0.5 MG tablet Take 0.5 mg by mouth 3 (three) times daily. Take three times every day per patient 12/05/12  Yes Nena Polio, PA-C  Lurasidone HCl (LATUDA) 20 MG TABS Take  1 tablet by mouth daily.    Yes Historical Provider, MD  omeprazole (PRILOSEC) 40 MG capsule Take 1 capsule (40 mg total) by mouth daily. 12/05/12  Yes Nena Polio, PA-C  ondansetron (ZOFRAN) 4 MG tablet Take 4 mg by mouth every 8 (eight) hours as needed for nausea or vomiting.   Yes Historical Provider, MD  oxyCODONE (ROXICODONE) 5 MG immediate release tablet Take 1 tablet (5 mg total) by mouth every 4 (four) hours as needed for severe pain. 01/10/14  Yes Harvie Heck, PA-C  phentermine 37.5 MG capsule Take 37.5 mg by mouth daily.   Yes Historical Provider, MD  tiZANidine (ZANAFLEX) 4 MG tablet Take 1 tablet (4 mg total) by mouth at bedtime. 12/11/13  Yes Ward Givens, NP  topiramate (TOPAMAX) 100 MG tablet Take 1 tablet (100 mg total)  by mouth daily. 01/27/13  Yes Star Age, MD  fluconazole (DIFLUCAN) 200 MG tablet Take 1 tablet (200 mg total) by mouth every other day. Patient not taking: Reported on 04/19/2014 04/02/14   Shelly Bombard, MD  methocarbamol (ROBAXIN) 500 MG tablet Take 2 tablets (1,000 mg total) by mouth 4 (four) times daily as needed (Pain). 04/19/14   Jahron Hunsinger, PA-C  metroNIDAZOLE (FLAGYL) 500 MG tablet Take 1 tablet (500 mg total) by mouth 2 (two) times daily. Patient not taking: Reported on 04/19/2014 04/03/14   Shelly Bombard, MD   BP 115/67 mmHg  Pulse 63  Temp(Src) 98.5 F (36.9 C) (Oral)  Resp 18  Ht 5\' 6"  (1.676 m)  Wt 295 lb (133.811 kg)  BMI 47.64 kg/m2  SpO2 98% Physical Exam  Constitutional: She is oriented to person, place, and time. She appears well-developed and well-nourished. No distress.  Obese  HENT:  Head: Normocephalic and atraumatic.  Mouth/Throat: Oropharynx is clear and moist.  Eyes: Conjunctivae and EOM are normal. Pupils are equal, round, and reactive to light.  Neck:  Patient splints, does not like to move the neck. There is no midline tenderness palpation of the posterior cervical spine.  Cardiovascular: Normal rate, regular rhythm and intact distal pulses.   Pulmonary/Chest: Effort normal and breath sounds normal. No stridor. No respiratory distress. She has no wheezes. She has no rales. She exhibits no tenderness.  Abdominal: Soft. Bowel sounds are normal. She exhibits no distension and no mass. There is no tenderness. There is no rebound and no guarding.  Musculoskeletal: Normal range of motion. She exhibits no edema.  Neurological: She is alert and oriented to person, place, and time.  Follows commands, Clear, goal oriented speech, Strength is 5 out of 5x4 extremities, patient ambulates with a coordinated in nonantalgic gait. Sensation is grossly intact.   Psychiatric: She has a normal mood and affect.  Nursing note and vitals reviewed.   ED Course   Procedures (including critical care time) Labs Review Labs Reviewed  CBC WITH DIFFERENTIAL - Abnormal; Notable for the following:    RBC 3.64 (*)    Hemoglobin 11.3 (*)    HCT 33.8 (*)    Neutrophils Relative % 34 (*)    Neutro Abs 1.4 (*)    Lymphocytes Relative 60 (*)    All other components within normal limits  BASIC METABOLIC PANEL - Abnormal; Notable for the following:    Glucose, Bld 111 (*)    All other components within normal limits    Imaging Review No results found.   EKG Interpretation None      MDM   Final diagnoses:  Musculoskeletal  pain   Filed Vitals:   04/19/14 2136 04/19/14 2226 04/19/14 2330  BP: 124/87 122/75 115/67  Pulse: 63 63 63  Temp: 98.5 F (36.9 C)    TempSrc: Oral    Resp: 16 18   Height: 5\' 6"  (1.676 m)    Weight: 295 lb (133.811 kg)    SpO2: 99% 100% 98%    Medications  morphine 4 MG/ML injection 4 mg (4 mg Intravenous Given 04/19/14 2238)  ondansetron (ZOFRAN) injection 4 mg (4 mg Intravenous Given 04/19/14 2238)  HYDROmorphone (DILAUDID) injection 0.5 mg (0.5 mg Intravenous Given 04/19/14 2341)    Nyliah Nierenberg is a pleasant 48 y.o. female presenting with right-sided pain with prior episodes of MS flares. Neuro exam is nonfocal. Case discussed with Dr. Aram Beecham will evaluate the patient. States that she does not follow with outpatient neurology regularly.  Allergy consult from Dr. Aram Beecham appreciated: He is come to evaluate the patient does not believe her pain is secondary to an MS flare, would not recommend starting IV steroids at this time. I have discussed the plan with patient who is amenable to discharge. She will be given an additional dose of Dilaudid in the ED. I've expecting to her that I cannot increase her dose of Percocet however will write her for Robaxin. Patient will follow with her primary care physician tomorrow.  Discussed case with attending MD who agrees with plan and stability to d/c to home.     Evaluation does not show pathology that would require ongoing emergent intervention or inpatient treatment. Pt is hemodynamically stable and mentating appropriately. Discussed findings and plan with patient/guardian, who agrees with care plan. All questions answered. Return precautions discussed and outpatient follow up given.   Discharge Medication List as of 04/19/2014 11:23 PM    START taking these medications   Details  methocarbamol (ROBAXIN) 500 MG tablet Take 2 tablets (1,000 mg total) by mouth 4 (four) times daily as needed (Pain)., Starting 04/19/2014, Until Discontinued, [employer], PA-C 04/20/14 1448  Orpah Greek, MD 04/23/14 [phone]

## 2014-04-19 NOTE — Consult Note (Signed)
NEURO HOSPITALIST CONSULT NOTE    Reason for Consult: diffuse body pain, generalized weakness, poor bladder control.  HPI:                                                                                                                                          Brenda Odonnell is an 48 y.o. female with multiple medical problems including HTN, migraine, bipolar disorder, morbid obesity, obstructive sleep apnea, chronic pain, and ? MS, presents for further evaluation of the above stated symptoms. Patient stated that she has daily pain that worsened in the past 3 or 4 days to the degree that her body " is not responding, I am very weak all over, my body lock up". She tells me that she gets this type of " flare up symptoms every 3 or 4 months". She said that she last saw her neurologist last July " and they are planning to do more MRI's because there is not final diagnosis yet". I did review the available outpatient neurological records as well as previous inpatient neurological consultation by my colleague Dr. Doy Mince and it seems that there is not a definitive MS diagnosis so far. In any case, she denies vertigo but complains of blurred vision. No slurred speech, language or vision impairment.  Past Medical History  Diagnosis Date  . Sinus infection   . Hypertension   . Arthritis   . Spinal stenosis   . Glaucoma   . Carpal tunnel syndrome   . Depression   . Bipolar 2 disorder   . Obesity   . Arthritis   . Glaucoma   . Leiomyoma of uterus, unspecified   . Sleep apnea   . MS (multiple sclerosis)     Past Surgical History  Procedure Laterality Date  . Carpal tunnel release      Left Hand  . Abdominal hysterectomy    . Knee surgery Left   . Myelogram    . Cesarean section      Family History  Problem Relation Age of Onset  . Hypertension    . Hypertension Father   . Hyperlipidemia Father     Family History: as above  Social History:  reports that she has  never smoked. She has never used smokeless tobacco. She reports that she does not drink alcohol or use illicit drugs.  Allergies  Allergen Reactions  . Ibuprofen Nausea And Vomiting    Due to gird    MEDICATIONS:  I have reviewed the patient's current medications.   ROS:                                                                                                                                       History obtained from the patient and chart review  General ROS: negative for - chills, fever, night sweats,  or weight loss Psychological ROS: negative for - behavioral disorder, hallucinations, memory difficulties, or suicidal ideation Ophthalmic ROS: negative for - double vision, eye pain or loss of vision ENT ROS: negative for - epistaxis, nasal discharge, oral lesions, sore throat, tinnitus or vertigo Allergy and Immunology ROS: negative for - hives or itchy/watery eyes Hematological and Lymphatic ROS: negative for - bleeding problems, bruising or swollen lymph nodes Endocrine ROS: negative for - galactorrhea, hair pattern changes, polydipsia/polyuria or temperature intolerance Respiratory ROS: negative for - cough, hemoptysis, shortness of breath or wheezing Cardiovascular ROS: negative for - chest pain, dyspnea on exertion, edema or irregular heartbeat Gastrointestinal ROS: negative for - abdominal pain, diarrhea, hematemesis, nausea/vomiting or stool incontinence Genito-Urinary ROS: negative for - hematuria Musculoskeletal ROS: negative for - joint swelling Neurological ROS: as noted in HPI Dermatological ROS: negative for rash and skin lesion changes  Physical exam: pleasant female in no apparent distress. Blood pressure 122/75, pulse 63, temperature 98.5 F (36.9 C), temperature source Oral, resp. rate 18, height 5' 6"  (1.676 m), weight 133.811 kg (295 lb), SpO2 100  %. Head: normocephalic. Neck: supple, no bruits, no JVD. Cardiac: no murmurs. Lungs: clear. Abdomen: soft, no tender, no mass. Extremities: no edema. Neurologic Examination:                                                                                                      General: Mental Status: Alert, oriented, thought content appropriate.  Speech fluent without evidence of aphasia.  Able to follow 3 step commands without difficulty. Cranial Nerves: II: Discs flat bilaterally; Visual fields grossly normal, pupils equal, round, reactive to light and accommodation III,IV, VI: ptosis not present, extra-ocular motions intact bilaterally V,VII: smile symmetric, facial light touch sensation normal bilaterally VIII: hearing normal bilaterally IX,X: gag reflex present XI: bilateral shoulder shrug XII: midline tongue extension without atrophy or fasciculations Motor: There is pain involved, give away weakness of the upper extremities, but I frankly think there is not convincing weakness of the upper or lower extremities. Tone and bulk:normal tone throughout; no atrophy noted Sensory: Pinprick and light touch intact throughout, bilaterally Deep  Tendon Reflexes:  1 all over Plantars: Right: downgoing   Left: downgoing Cerebellar: normal finger-to-nose,  normal heel-to-shin test Gait:  No tested.    Lab Results  Component Value Date/Time   CHOL 187 05/19/2012 05:50 AM    Results for orders placed or performed during the hospital encounter of 04/19/14 (from the past 48 hour(s))  CBC with Differential     Status: Abnormal   Collection Time: 04/19/14 10:27 PM  Result Value Ref Range   WBC 4.0 4.0 - 10.5 K/uL   RBC 3.64 (L) 3.87 - 5.11 MIL/uL   Hemoglobin 11.3 (L) 12.0 - 15.0 g/dL   HCT 33.8 (L) 36.0 - 46.0 %   MCV 92.9 78.0 - 100.0 fL   MCH 31.0 26.0 - 34.0 pg   MCHC 33.4 30.0 - 36.0 g/dL   RDW 13.4 11.5 - 15.5 %   Platelets 238 150 - 400 K/uL   Neutrophils Relative % 34 (L)  43 - 77 %   Neutro Abs 1.4 (L) 1.7 - 7.7 K/uL   Lymphocytes Relative 60 (H) 12 - 46 %   Lymphs Abs 2.4 0.7 - 4.0 K/uL   Monocytes Relative 5 3 - 12 %   Monocytes Absolute 0.2 0.1 - 1.0 K/uL   Eosinophils Relative 1 0 - 5 %   Eosinophils Absolute 0.1 0.0 - 0.7 K/uL   Basophils Relative 0 0 - 1 %   Basophils Absolute 0.0 0.0 - 0.1 K/uL  Basic metabolic panel     Status: Abnormal   Collection Time: 04/19/14 10:27 PM  Result Value Ref Range   Sodium 143 137 - 147 mEq/L   Potassium 4.5 3.7 - 5.3 mEq/L   Chloride 106 96 - 112 mEq/L   CO2 26 19 - 32 mEq/L   Glucose, Bld 111 (H) 70 - 99 mg/dL   BUN 11 6 - 23 mg/dL   Creatinine, Ser 0.75 0.50 - 1.10 mg/dL   Calcium 9.2 8.4 - 10.5 mg/dL   GFR calc non Af Amer >90 >90 mL/min   GFR calc Af Amer >90 >90 mL/min    Comment: (NOTE) The eGFR has been calculated using the CKD EPI equation. This calculation has not been validated in all clinical situations. eGFR's persistently <90 mL/min signify possible Chronic Kidney Disease.    Anion gap 11 5 - 15    No results found.  Assessment/Plan: 48 y/o comes in complaining of increasing generalized body pain-weakness, poor bladder control. Some inconsistencies on exam but overall non focal neuroexam. She reports similar " flare up episodes occuring every 3 or 4 months" but the available MRI brain and neurological records are not convincing enough to say that she has MS. Will not recommend IV solumedrol at this time, instead pain management and further outpatient neurological evaluation regarding presumed MS diagnosis.  Dorian Pod, MD 04/19/2014, 11:40 PM

## 2014-04-19 NOTE — ED Notes (Signed)
Elmyra Ricks, pa, at the bedside.

## 2014-04-19 NOTE — Discharge Instructions (Signed)
For pain control you may take up to 800mg  of Motrin (also known as ibuprofen). That is usually 4 over the counter pills,  3 times a day. Take with food to minimize stomach irritation   You can also take  tylenol (acetaminophen) 975mg  (this is 3 over the counter pills) four times a day. Do not drink alcohol or combine with other medications that have acetaminophen as an ingredient (Read the labels!).    For breakthrough pain you may take Robaxin. Do not drink alcohol, drive or operate heavy machinery when taking Robaxin.   Muscle Pain Muscle pain (myalgia) may be caused by many things, including:  Overuse or muscle strain, especially if you are not in shape. This is the most common cause of muscle pain.  Injury.  Bruises.  Viruses, such as the flu.  Infectious diseases.  Fibromyalgia, which is a chronic condition that causes muscle tenderness, fatigue, and headache.  Autoimmune diseases, including lupus.  Certain drugs, including ACE inhibitors and statins. Muscle pain may be mild or severe. In most cases, the pain lasts only a short time and goes away without treatment. To diagnose the cause of your muscle pain, your health care provider will take your medical history. This means he or she will ask you when your muscle pain began and what has been happening. If you have not had muscle pain for very long, your health care provider may want to wait before doing much testing. If your muscle pain has lasted a long time, your health care provider may want to run tests right away. If your health care provider thinks your muscle pain may be caused by illness, you may need to have additional tests to rule out certain conditions.  Treatment for muscle pain depends on the cause. Home care is often enough to relieve muscle pain. Your health care provider may also prescribe anti-inflammatory medicine. HOME CARE INSTRUCTIONS Watch your condition for any changes. The following actions may help to  lessen any discomfort you are feeling:  Only take over-the-counter or prescription medicines as directed by your health care provider.  Apply ice to the sore muscle:  Put ice in a plastic bag.  Place a towel between your skin and the bag.  Leave the ice on for 15-20 minutes, 3-4 times a day.  You may alternate applying hot and cold packs to the muscle as directed by your health care provider.  If overuse is causing your muscle pain, slow down your activities until the pain goes away.  Remember that it is normal to feel some muscle pain after starting a workout program. Muscles that have not been used often will be sore at first.  Do regular, gentle exercises if you are not usually active.  Warm up before exercising to lower your risk of muscle pain.  Do not continue working out if the pain is very bad. Bad pain could mean you have injured a muscle. SEEK MEDICAL CARE IF:  Your muscle pain gets worse, and medicines do not help.  You have muscle pain that lasts longer than 3 days.  You have a rash or fever along with muscle pain.  You have muscle pain after a tick bite.  You have muscle pain while working out, even though you are in good physical condition.  You have redness, soreness, or swelling along with muscle pain.  You have muscle pain after starting a new medicine or changing the dose of a medicine. SEEK IMMEDIATE MEDICAL CARE IF:  You  have trouble breathing.  You have trouble swallowing.  You have muscle pain along with a stiff neck, fever, and vomiting.  You have severe muscle weakness or cannot move part of your body. MAKE SURE YOU:   Understand these instructions.  Will watch your condition.  Will get help right away if you are not doing well or get worse. Document Released: 03/12/2006 Document Revised: 04/25/2013 Document Reviewed: 02/14/2013 [facility] Patient Information 2015 [place], [place]. This information is not intended to replace advice given to  you by your health care provider. Make sure you discuss any questions you have with your health care provider.

## 2014-04-19 NOTE — ED Notes (Signed)
Reports MS flare with generalized pain X3d, worse today, denies V/D/F, no CP/SOB, A/O, NAD

## 2014-08-15 ENCOUNTER — Other Ambulatory Visit: Payer: Self-pay | Admitting: Obstetrics

## 2014-08-15 ENCOUNTER — Other Ambulatory Visit: Payer: Self-pay | Admitting: *Deleted

## 2014-08-15 DIAGNOSIS — A6000 Herpesviral infection of urogenital system, unspecified: Secondary | ICD-10-CM

## 2014-08-15 MED ORDER — VALACYCLOVIR HCL 500 MG PO TABS: ORAL_TABLET | ORAL | Status: DC

## 2014-08-15 NOTE — Telephone Encounter (Signed)
Please review

## 2014-08-27 ENCOUNTER — Encounter (HOSPITAL_COMMUNITY): Payer: Self-pay | Admitting: Emergency Medicine

## 2014-08-27 ENCOUNTER — Emergency Department (HOSPITAL_COMMUNITY)
Admission: EM | Admit: 2014-08-27 | Discharge: 2014-08-27 | Disposition: A | Discharge status: Home / Self Care | Payer: Medicaid Other | Attending: Emergency Medicine | Admitting: Emergency Medicine

## 2014-08-27 DIAGNOSIS — L02212 Cutaneous abscess of back [any part, except buttock]: Secondary | ICD-10-CM

## 2014-08-27 DIAGNOSIS — Z8739 Personal history of other diseases of the musculoskeletal system and connective tissue: Secondary | ICD-10-CM | POA: Diagnosis not present

## 2014-08-27 DIAGNOSIS — M549 Dorsalgia, unspecified: Secondary | ICD-10-CM | POA: Diagnosis present

## 2014-08-27 DIAGNOSIS — Z8742 Personal history of other diseases of the female genital tract: Secondary | ICD-10-CM | POA: Insufficient documentation

## 2014-08-27 DIAGNOSIS — Z792 Long term (current) use of antibiotics: Secondary | ICD-10-CM | POA: Insufficient documentation

## 2014-08-27 DIAGNOSIS — Z8709 Personal history of other diseases of the respiratory system: Secondary | ICD-10-CM | POA: Insufficient documentation

## 2014-08-27 DIAGNOSIS — I10 Essential (primary) hypertension: Secondary | ICD-10-CM | POA: Diagnosis not present

## 2014-08-27 DIAGNOSIS — F3181 Bipolar II disorder: Secondary | ICD-10-CM | POA: Insufficient documentation

## 2014-08-27 DIAGNOSIS — H409 Unspecified glaucoma: Secondary | ICD-10-CM | POA: Insufficient documentation

## 2014-08-27 DIAGNOSIS — Z791 Long term (current) use of non-steroidal anti-inflammatories (NSAID): Secondary | ICD-10-CM | POA: Diagnosis not present

## 2014-08-27 DIAGNOSIS — Z79899 Other long term (current) drug therapy: Secondary | ICD-10-CM | POA: Diagnosis not present

## 2014-08-27 DIAGNOSIS — E669 Obesity, unspecified: Secondary | ICD-10-CM | POA: Diagnosis not present

## 2014-08-27 DIAGNOSIS — G43909 Migraine, unspecified, not intractable, without status migrainosus: Secondary | ICD-10-CM | POA: Diagnosis not present

## 2014-08-27 HISTORY — DX: Migraine, unspecified, not intractable, without status migrainosus: G43.909

## 2014-08-27 LAB — I-STAT CHEM 8, ED
BUN: 12 mg/dL (ref 6–23)
CREATININE: 0.9 mg/dL (ref 0.50–1.10)
Calcium, Ion: 1.19 mmol/L (ref 1.12–1.23)
Chloride: 104 mmol/L (ref 96–112)
GLUCOSE: 106 mg/dL — AB (ref 70–99)
HCT: 38 % (ref 36.0–46.0)
Hemoglobin: 12.9 g/dL (ref 12.0–15.0)
Potassium: 4.4 mmol/L (ref 3.5–5.1)
Sodium: 136 mmol/L (ref 135–145)
TCO2: 20 mmol/L (ref 0–100)

## 2014-08-27 LAB — CBC WITH DIFFERENTIAL/PLATELET
Basophils Absolute: 0 10*3/uL (ref 0.0–0.1)
Basophils Relative: 0 % (ref 0–1)
EOS PCT: 0 % (ref 0–5)
Eosinophils Absolute: 0 10*3/uL (ref 0.0–0.7)
HEMATOCRIT: 34.8 % — AB (ref 36.0–46.0)
Hemoglobin: 11.6 g/dL — ABNORMAL LOW (ref 12.0–15.0)
LYMPHS PCT: 19 % (ref 12–46)
Lymphs Abs: 1.8 10*3/uL (ref 0.7–4.0)
MCH: 30.1 pg (ref 26.0–34.0)
MCHC: 33.3 g/dL (ref 30.0–36.0)
MCV: 90.4 fL (ref 78.0–100.0)
MONOS PCT: 8 % (ref 3–12)
Monocytes Absolute: 0.8 10*3/uL (ref 0.1–1.0)
Neutro Abs: 7.1 10*3/uL (ref 1.7–7.7)
Neutrophils Relative %: 73 % (ref 43–77)
Platelets: 227 10*3/uL (ref 150–400)
RBC: 3.85 MIL/uL — ABNORMAL LOW (ref 3.87–5.11)
RDW: 13.9 % (ref 11.5–15.5)
WBC: 9.8 10*3/uL (ref 4.0–10.5)

## 2014-08-27 LAB — I-STAT CG4 LACTIC ACID, ED: Lactic Acid, Venous: 1.04 mmol/L (ref 0.5–2.0)

## 2014-08-27 MED ORDER — HYDROMORPHONE HCL 1 MG/ML IJ SOLN
1.0000 mg | Freq: Once | INTRAMUSCULAR | Status: AC
  Administered 2014-08-27: 1 mg via INTRAVENOUS
  Filled 2014-08-27: qty 1

## 2014-08-27 MED ORDER — ACETAMINOPHEN 325 MG PO TABS
650.0000 mg | ORAL_TABLET | Freq: Once | ORAL | Status: AC
  Administered 2014-08-27: 650 mg via ORAL
  Filled 2014-08-27: qty 2

## 2014-08-27 MED ORDER — OXYCODONE-ACETAMINOPHEN 5-325 MG PO TABS: 1.0000 | ORAL_TABLET | ORAL | Status: DC | PRN

## 2014-08-27 MED ORDER — LIDOCAINE-EPINEPHRINE (PF) 2 %-1:200000 IJ SOLN
10.0000 mL | Freq: Once | INTRAMUSCULAR | Status: DC
  Filled 2014-08-27: qty 20

## 2014-08-27 MED ORDER — CLINDAMYCIN PHOSPHATE 600 MG/50ML IV SOLN
600.0000 mg | Freq: Once | INTRAVENOUS | Status: AC
  Administered 2014-08-27: 600 mg via INTRAVENOUS
  Filled 2014-08-27: qty 50

## 2014-08-27 MED ORDER — CLINDAMYCIN HCL 150 MG PO CAPS: 300.0000 mg | ORAL_CAPSULE | Freq: Three times a day (TID) | ORAL | Status: DC

## 2014-08-27 NOTE — ED Notes (Addendum)
Pt st's she was bitten by something on left mid  Back on Thursday.  Pt c/o pain to area also c/o neck stiffness dizziness and headache

## 2014-08-27 NOTE — Discharge Instructions (Signed)
Take antibiotic as prescribed until all gone, next dose tomorrow. Take percocet as prescribed as needed for pain. Warm compresses to the area. Return or see your doctor in 2 days for recheck and packing removal. Return sooner if worsening quickly   Abscess Care After An abscess (also called a boil or furuncle) is an infected area that contains a collection of pus. Signs and symptoms of an abscess include pain, tenderness, redness, or hardness, or you may feel a moveable soft area under your skin. An abscess can occur anywhere in the body. The infection may spread to surrounding tissues causing cellulitis. A cut (incision) by the surgeon was made over your abscess and the pus was drained out. Gauze may have been packed into the space to provide a drain that will allow the cavity to heal from the inside outwards. The boil may be painful for 5 to 7 days. Most people with a boil do not have high fevers. Your abscess, if seen early, may not have localized, and may not have been lanced. If not, another appointment may be required for this if it does not get better on its own or with medications. HOME CARE INSTRUCTIONS   Only take over-the-counter or prescription medicines for pain, discomfort, or fever as directed by your caregiver.  When you bathe, soak and then remove gauze or iodoform packs at least daily or as directed by your caregiver. You may then wash the wound gently with mild soapy water. Repack with gauze or do as your caregiver directs. SEEK IMMEDIATE MEDICAL CARE IF:   You develop increased pain, swelling, redness, drainage, or bleeding in the wound site.  You develop signs of generalized infection including muscle aches, chills, fever, or a general ill feeling.  An oral temperature above 102 F (38.9 C) develops, not controlled by medication. See your caregiver for a recheck if you develop any of the symptoms described above. If medications (antibiotics) were prescribed, take them as  directed. Document Released: 11/06/2004 Document Revised: 07/13/2011 Document Reviewed: 07/04/2007 [facility] Patient Information 2015 [place], [place]. This information is not intended to replace advice given to you by your health care provider. Make sure you discuss any questions you have with your health care provider.

## 2014-08-27 NOTE — ED Provider Notes (Signed)
CSN: [id number]     Arrival date & time 08/27/14  1423 History   First MD Initiated Contact with Patient 08/27/14 1839     Chief Complaint  Patient presents with  . Insect Bite     (Consider location/radiation/quality/duration/timing/severity/associated sxs/prior Treatment) HPI Brenda Odonnell is a 49 y.o. female with history of MS, bipolar, migraines, hypertension, presents to emergency department complaining of swelling and pain to the back after possible insect bite. She states 4 days ago she came inside from being outdoors and she noticed a bump to her back. She did not actually feel anything stating her. She states since then the bump has been getting larger and more painful. Today she developed headache, mild fever. She has not taken anything for her temperature or her for her pain other than her regular medications. She has no other complaints. Specifically no nausea, vomiting, body cramping, abdominal pain.  Past Medical History  Diagnosis Date  . Sinus infection   . Hypertension   . Arthritis   . Spinal stenosis   . Glaucoma   . Carpal tunnel syndrome   . Depression   . Bipolar 2 disorder   . Obesity   . Arthritis   . Glaucoma   . Leiomyoma of uterus, unspecified   . Sleep apnea   . MS (multiple sclerosis)   . Migraine    Past Surgical History  Procedure Laterality Date  . Carpal tunnel release      Left Hand  . Abdominal hysterectomy    . Knee surgery Left   . Myelogram    . Cesarean section     Family History  Problem Relation Age of Onset  . Hypertension    . Hypertension Father   . Hyperlipidemia Father    History  Substance Use Topics  . Smoking status: Never Smoker   . Smokeless tobacco: Never Used  . Alcohol Use: No   OB History    Gravida Para Term Preterm AB TAB SAB Ectopic Multiple Living   3 3 3       3      Review of Systems  Constitutional: Positive for fever and chills.  Respiratory: Negative for cough, chest tightness and shortness of  breath.   Cardiovascular: Negative for chest pain, palpitations and leg swelling.  Gastrointestinal: Negative for nausea, vomiting, abdominal pain and diarrhea.  Genitourinary: Negative for dysuria, flank pain and pelvic pain.  Musculoskeletal: Negative for myalgias, arthralgias, neck pain and neck stiffness.  Skin: Positive for color change and wound. Negative for rash.  Neurological: Positive for headaches. Negative for dizziness and weakness.  All other systems reviewed and are negative.     Allergies  Ibuprofen  Home Medications   Prior to Admission medications   Medication Sig Start Date End Date Taking? Authorizing Provider  brimonidine-timolol (COMBIGAN) 0.2-0.5 % ophthalmic solution Place 1 drop into both eyes 2 (two) times daily.   Yes Historical Provider, MD  clotrimazole (LOTRIMIN) 1 % cream Apply 1 application topically 2 (two) times daily. 04/02/14  Yes Shelly Bombard, MD  diclofenac sodium (VOLTAREN) 1 % GEL Apply 2 g topically daily as needed. For knee and foot pain   Yes Historical Provider, MD  dorzolamide (TRUSOPT) 2 % ophthalmic solution Place 1 drop into both eyes daily.   Yes Historical Provider, MD  FLUoxetine (PROZAC) 40 MG capsule Take 1 capsule (40 mg total) by mouth daily. 12/05/12  Yes Marlane Hatcher Mashburn, PA-C  furosemide (LASIX) 40 MG tablet Take 40 mg  by mouth daily.   Yes Historical Provider, MD  gabapentin (NEURONTIN) 600 MG tablet Take 600 mg by mouth 3 (three) times daily.   Yes Historical Provider, MD  lamoTRIgine (LAMICTAL) 100 MG tablet Take 100 mg by mouth daily. Take with the 150mg  to make a total of 250mg  daily   Yes Historical Provider, MD  lamoTRIgine (LAMICTAL) 150 MG tablet Take 150 mg by mouth daily. Take with the 100mg  tablet to make total of 250mg  daily   Yes Historical Provider, MD  lisinopril (PRINIVIL,ZESTRIL) 40 MG tablet Take 1 tablet (40 mg total) by mouth daily. 12/05/12  Yes Milta Deiters T Mashburn, PA-C  LORazepam (ATIVAN) 0.5 MG tablet Take 0.5  mg by mouth 3 (three) times daily. Take three times every day per patient 12/05/12  Yes Marlane Hatcher Mashburn, PA-C  methocarbamol (ROBAXIN) 500 MG tablet Take 2 tablets (1,000 mg total) by mouth 4 (four) times daily as needed (Pain). 04/19/14  Yes Nicole Pisciotta, PA-C  omeprazole (PRILOSEC) 40 MG capsule Take 1 capsule (40 mg total) by mouth daily. 12/05/12  Yes Milta Deiters T Mashburn, PA-C  ondansetron (ZOFRAN) 4 MG tablet Take 4 mg by mouth every 8 (eight) hours as needed for nausea or vomiting.   Yes Historical Provider, MD  oxyCODONE (ROXICODONE) 5 MG immediate release tablet Take 1 tablet (5 mg total) by mouth every 4 (four) hours as needed for severe pain. 01/10/14  Yes Harvie Heck, PA-C  phentermine 37.5 MG capsule Take 37.5 mg by mouth daily.   Yes Historical Provider, MD  topiramate (TOPAMAX) 100 MG tablet Take 1 tablet (100 mg total) by mouth daily. 01/27/13  Yes Star Age, MD  zolpidem (AMBIEN) 10 MG tablet Take 10 mg by mouth at bedtime.   Yes Historical Provider, MD  bimatoprost (LUMIGAN) 0.01 % SOLN Place 1 drop into both eyes at bedtime.     Historical Provider, MD  fluconazole (DIFLUCAN) 200 MG tablet Take 1 tablet (200 mg total) by mouth every other day. Patient not taking: Reported on 04/19/2014 04/02/14   Shelly Bombard, MD  metroNIDAZOLE (FLAGYL) 500 MG tablet Take 1 tablet (500 mg total) by mouth 2 (two) times daily. Patient not taking: Reported on 04/19/2014 04/03/14   Shelly Bombard, MD  tiZANidine (ZANAFLEX) 4 MG tablet Take 1 tablet (4 mg total) by mouth at bedtime. Patient not taking: Reported on 08/27/2014 12/11/13   Trudie Buckler, NP  valACYclovir (VALTREX) 500 MG tablet 1 po bid x 3 days prn. Patient taking differently: Take 500 mg by mouth daily as needed.  08/15/14   Shelly Bombard, MD   BP 115/54 mmHg  Pulse 95  Temp(Src) 100.4 F (38 C) (Oral)  Resp 18  Ht 5\' 6"  (1.676 m)  Wt 278 lb (126.1 kg)  BMI 44.89 kg/m2  SpO2 100% Physical Exam  Constitutional: She appears  well-developed and well-nourished. No distress.  HENT:  Head: Normocephalic.  Eyes: Conjunctivae are normal.  Neck: Neck supple.  Cardiovascular: Normal rate, regular rhythm and normal heart sounds.   Pulmonary/Chest: Effort normal and breath sounds normal. No respiratory distress. She has no wheezes. She has no rales.  Abdominal: Soft. Bowel sounds are normal. She exhibits no distension. There is no tenderness. There is no rebound.  Musculoskeletal: She exhibits no edema.  Neurological: She is alert.  Skin: Skin is warm and dry.  There is a small area of erythema, induration, fluctuance to the left mid back, measuring approximately 3 cm in diameter. There is  a larger surrounding area of induration without any skin color changing. There is tender to palpation.  Psychiatric: She has a normal mood and affect. Her behavior is normal.  Nursing note and vitals reviewed.   ED Course  Procedures (including critical care time) Labs Review Labs Reviewed  CBC WITH DIFFERENTIAL/PLATELET - Abnormal; Notable for the following:    RBC 3.85 (*)    Hemoglobin 11.6 (*)    HCT 34.8 (*)    All other components within normal limits  I-STAT CHEM 8, ED - Abnormal; Notable for the following:    Glucose, Bld 106 (*)    All other components within normal limits  WOUND CULTURE  I-STAT CG4 LACTIC ACID, ED  I-STAT CG4 LACTIC ACID, ED    Imaging Review No results found.   EKG Interpretation None      INCISION AND DRAINAGE Performed by: Jeannett Senior A Consent: Verbal consent obtained. Risks and benefits: risks, benefits and alternatives were discussed Type: abscess  Body area: left back  Anesthesia: local infiltration  Incision was made with a scalpel.  Local anesthetic: lidocaine 2% w epinephrine  Anesthetic total: 4 ml  Complexity: complex Blunt dissection to break up loculations  Drainage: purulent  Drainage amount: moderate  Packing material: 1/4 in iodoform  gauze  Patient tolerance: Patient tolerated the procedure well with no immediate complications.    MDM   Final diagnoses:  Abscess of back    Pt with a soft tissue abscess to the back. Her temp in ED is 100.4. BS normal otherwise. Labs show normal WBC with no left shift. Lactic acid negative. Abscess I&Ded. Pt is stable for outpatient treatment at this time with antibiotics. IV clindamycin dose given in ED. Home with follow up in 2 days. Return precautions discussed  Filed Vitals:   08/27/14 2015 08/27/14 2045 08/27/14 2100 08/27/14 2140  BP: 105/68 103/59 99/60 103/59  Pulse: 96 92 91 91  Temp:      TempSrc:      Resp: 23 25 31 16   Height:      Weight:      SpO2: 98% 95% 96% 99%     Jeannett Senior, PA-C 08/28/14 0107  Daleen Bo, MD 08/29/14 7972

## 2014-08-28 ENCOUNTER — Institutional Professional Consult (permissible substitution): Payer: Medicaid Other | Admitting: Neurology

## 2014-08-30 ENCOUNTER — Telehealth (HOSPITAL_BASED_OUTPATIENT_CLINIC_OR_DEPARTMENT_OTHER): Payer: Self-pay | Admitting: Emergency Medicine

## 2014-08-30 LAB — WOUND CULTURE
GRAM STAIN: NONE SEEN
Special Requests: NORMAL

## 2014-09-01 ENCOUNTER — Telehealth: Payer: Self-pay | Admitting: Emergency Medicine

## 2014-09-01 NOTE — Telephone Encounter (Signed)
Post ED Visit - Positive Culture Follow-up  Culture report reviewed by antimicrobial stewardship pharmacist: []  Wes Kiana, Pharm.D., BCPS []  Heide Guile, Pharm.D., BCPS []  Alycia Rossetti, Pharm.D., BCPS [x]  [place], Pharm.D., BCPS, AAHIVP []  Legrand Como, Pharm.D., BCPS, AAHIVP []  Isac Sarna, Pharm.D., BCPS  Positive wound culture Treated with Clindamycin, organism sensitive to the same and no further patient follow-up is required at this time.  Ernesta Amble 09/01/2014, 10:42 AM

## 2014-09-03 ENCOUNTER — Emergency Department (HOSPITAL_COMMUNITY)
Admission: EM | Admit: 2014-09-03 | Discharge: 2014-09-03 | Disposition: A | Discharge status: Home / Self Care | Payer: Medicaid Other | Attending: Emergency Medicine | Admitting: Emergency Medicine

## 2014-09-03 ENCOUNTER — Encounter (HOSPITAL_COMMUNITY): Payer: Self-pay

## 2014-09-03 DIAGNOSIS — H409 Unspecified glaucoma: Secondary | ICD-10-CM | POA: Insufficient documentation

## 2014-09-03 DIAGNOSIS — Z792 Long term (current) use of antibiotics: Secondary | ICD-10-CM | POA: Diagnosis not present

## 2014-09-03 DIAGNOSIS — Z5189 Encounter for other specified aftercare: Secondary | ICD-10-CM

## 2014-09-03 DIAGNOSIS — Z79899 Other long term (current) drug therapy: Secondary | ICD-10-CM | POA: Diagnosis not present

## 2014-09-03 DIAGNOSIS — Z4801 Encounter for change or removal of surgical wound dressing: Secondary | ICD-10-CM | POA: Diagnosis present

## 2014-09-03 DIAGNOSIS — M1990 Unspecified osteoarthritis, unspecified site: Secondary | ICD-10-CM | POA: Insufficient documentation

## 2014-09-03 DIAGNOSIS — I10 Essential (primary) hypertension: Secondary | ICD-10-CM | POA: Diagnosis not present

## 2014-09-03 DIAGNOSIS — Z791 Long term (current) use of non-steroidal anti-inflammatories (NSAID): Secondary | ICD-10-CM | POA: Diagnosis not present

## 2014-09-03 DIAGNOSIS — G43909 Migraine, unspecified, not intractable, without status migrainosus: Secondary | ICD-10-CM | POA: Diagnosis not present

## 2014-09-03 DIAGNOSIS — E669 Obesity, unspecified: Secondary | ICD-10-CM | POA: Diagnosis not present

## 2014-09-03 DIAGNOSIS — Z7952 Long term (current) use of systemic steroids: Secondary | ICD-10-CM | POA: Insufficient documentation

## 2014-09-03 DIAGNOSIS — Z862 Personal history of diseases of the blood and blood-forming organs and certain disorders involving the immune mechanism: Secondary | ICD-10-CM | POA: Diagnosis not present

## 2014-09-03 DIAGNOSIS — Z7951 Long term (current) use of inhaled steroids: Secondary | ICD-10-CM | POA: Diagnosis not present

## 2014-09-03 DIAGNOSIS — F3181 Bipolar II disorder: Secondary | ICD-10-CM | POA: Diagnosis not present

## 2014-09-03 DIAGNOSIS — Z8709 Personal history of other diseases of the respiratory system: Secondary | ICD-10-CM | POA: Insufficient documentation

## 2014-09-03 NOTE — ED Notes (Addendum)
Pt. Was called back today for a + MRSA culture to an abscess on her back.  She reports that the abscess is still draining and tender.  She also is having a sore throat and nasal congestion with lightheadedness, Pt. Is anxious

## 2014-09-03 NOTE — Discharge Instructions (Signed)
Continue taking your antibiotics as directed. Please use warm compresses and continue to keep the wound covered. Return for new or worsening symptoms.  Abscess Care After An abscess (also called a boil or furuncle) is an infected area that contains a collection of pus. Signs and symptoms of an abscess include pain, tenderness, redness, or hardness, or you may feel a moveable soft area under your skin. An abscess can occur anywhere in the body. The infection may spread to surrounding tissues causing cellulitis. A cut (incision) by the surgeon was made over your abscess and the pus was drained out. Gauze may have been packed into the space to provide a drain that will allow the cavity to heal from the inside outwards. The boil may be painful for 5 to 7 days. Most people with a boil do not have high fevers. Your abscess, if seen early, may not have localized, and may not have been lanced. If not, another appointment may be required for this if it does not get better on its own or with medications. HOME CARE INSTRUCTIONS   Only take over-the-counter or prescription medicines for pain, discomfort, or fever as directed by your caregiver.  When you bathe, soak and then remove gauze or iodoform packs at least daily or as directed by your caregiver. You may then wash the wound gently with mild soapy water. Repack with gauze or do as your caregiver directs. SEEK IMMEDIATE MEDICAL CARE IF:   You develop increased pain, swelling, redness, drainage, or bleeding in the wound site.  You develop signs of generalized infection including muscle aches, chills, fever, or a general ill feeling.  An oral temperature above 102 F (38.9 C) develops, not controlled by medication. See your caregiver for a recheck if you develop any of the symptoms described above. If medications (antibiotics) were prescribed, take them as directed. Document Released: 11/06/2004 Document Revised: 07/13/2011 Document Reviewed:  07/04/2007 [employer] Patient Information 2015 [place], [place]. This information is not intended to replace advice given to you by your health care provider. Make sure you discuss any questions you have with your health care provider.

## 2014-09-03 NOTE — ED Provider Notes (Signed)
CSN: [id number]     Arrival date & time 09/03/14  1441 History   First MD Initiated Contact with Patient 09/03/14 1610     Chief Complaint  Patient presents with  . Wound Check     (Consider location/radiation/quality/duration/timing/severity/associated sxs/prior Treatment) HPI Comments: Patient presents to the emergency department with chief complaint of wound check. She was seen about a week ago for an abscess on her back. The abscess was drained in the emergency department. A wound culture was obtained, and was positive for MRSA. She was notified of this today, and was told to come back to the ED for recheck. She denies any worsening symptoms. Denies any fevers or chills. She states the wound is still moderately tender to palpation. She is taking her antibiotics as directed. She states that she gets a refill of her chronic pain medications tomorrow.  The history is provided by the patient. No language interpreter was used.    Past Medical History  Diagnosis Date  . Sinus infection   . Hypertension   . Arthritis   . Spinal stenosis   . Glaucoma   . Carpal tunnel syndrome   . Depression   . Bipolar 2 disorder   . Obesity   . Arthritis   . Glaucoma   . Leiomyoma of uterus, unspecified   . Sleep apnea   . MS (multiple sclerosis)   . Migraine    Past Surgical History  Procedure Laterality Date  . Carpal tunnel release      Left Hand  . Abdominal hysterectomy    . Knee surgery Left   . Myelogram    . Cesarean section     Family History  Problem Relation Age of Onset  . Hypertension    . Hypertension Father   . Hyperlipidemia Father    History  Substance Use Topics  . Smoking status: Never Smoker   . Smokeless tobacco: Never Used  . Alcohol Use: No   OB History    Gravida Para Term Preterm AB TAB SAB Ectopic Multiple Living   3 3 3       3      Review of Systems  Constitutional: Negative for fever and chills.  Respiratory: Negative for shortness of breath.    Cardiovascular: Negative for chest pain.  Gastrointestinal: Negative for nausea, vomiting, diarrhea and constipation.  Genitourinary: Negative for dysuria.  Skin: Positive for wound.  All other systems reviewed and are negative.     Allergies  Ibuprofen  Home Medications   Prior to Admission medications   Medication Sig Start Date End Date Taking? Authorizing Provider  brimonidine-timolol (COMBIGAN) 0.2-0.5 % ophthalmic solution Place 1 drop into both eyes 2 (two) times daily.   Yes Historical Provider, MD  clindamycin (CLEOCIN) 150 MG capsule Take 2 capsules (300 mg total) by mouth 3 (three) times daily. 08/27/14  Yes Tatyana Kirichenko, PA-C  clotrimazole (LOTRIMIN) 1 % cream Apply 1 application topically 2 (two) times daily. Patient taking differently: Apply 1 application topically 2 (two) times daily as needed (skin irritations).  04/02/14  Yes Shelly Bombard, MD  diazepam (VALIUM) 10 MG tablet Take 10 mg by mouth daily as needed for anxiety (before MRI procedure).  07/10/14  Yes Historical Provider, MD  diclofenac sodium (VOLTAREN) 1 % GEL Apply 2 g topically daily as needed. For knee and foot pain   Yes Historical Provider, MD  dorzolamide (TRUSOPT) 2 % ophthalmic solution Place 1 drop into both eyes 2 (two) times daily.  Yes Historical Provider, MD  fluconazole (DIFLUCAN) 200 MG tablet Take 1 tablet (200 mg total) by mouth every other day. Patient taking differently: Take 200 mg by mouth daily as needed (infections).  04/02/14  Yes Shelly Bombard, MD  FLUoxetine (PROZAC) 40 MG capsule Take 1 capsule (40 mg total) by mouth daily. 12/05/12  Yes Milta Deiters T Mashburn, PA-C  fluticasone (FLONASE) 50 MCG/ACT nasal spray Place 1 spray into both nostrils daily as needed for allergies or rhinitis.  06/06/14  Yes Historical Provider, MD  furosemide (LASIX) 40 MG tablet Take 40 mg by mouth daily.   Yes Historical Provider, MD  gabapentin (NEURONTIN) 600 MG tablet Take 600 mg by mouth 3 (three)  times daily.   Yes Historical Provider, MD  lamoTRIgine (LAMICTAL) 100 MG tablet Take 100 mg by mouth daily. Take with the 150mg  to make a total of 250mg  daily   Yes Historical Provider, MD  lamoTRIgine (LAMICTAL) 150 MG tablet Take 150 mg by mouth daily. Take with the 100mg  tablet to make total of 250mg  daily   Yes Historical Provider, MD  lisinopril (PRINIVIL,ZESTRIL) 40 MG tablet Take 1 tablet (40 mg total) by mouth daily. 12/05/12  Yes Milta Deiters T Mashburn, PA-C  LORazepam (ATIVAN) 0.5 MG tablet Take 0.5 mg by mouth 3 (three) times daily. Take three times every day per patient 12/05/12  Yes Milta Deiters T Mashburn, PA-C  metroNIDAZOLE (FLAGYL) 500 MG tablet Take 1 tablet (500 mg total) by mouth 2 (two) times daily. Patient taking differently: Take 500 mg by mouth 2 (two) times daily as needed (infections).  04/03/14  Yes Shelly Bombard, MD  omeprazole (PRILOSEC) 40 MG capsule Take 1 capsule (40 mg total) by mouth daily. 12/05/12  Yes Milta Deiters T Mashburn, PA-C  ondansetron (ZOFRAN) 4 MG tablet Take 4 mg by mouth every 8 (eight) hours as needed for nausea or vomiting.   Yes Historical Provider, MD  oxyCODONE-acetaminophen (PERCOCET) 10-325 MG per tablet Take 1 tablet by mouth every 6 (six) hours as needed for pain.  08/16/14  Yes Historical Provider, MD  phentermine 37.5 MG capsule Take 37.5 mg by mouth daily.   Yes Historical Provider, MD  topiramate (TOPAMAX) 100 MG tablet Take 1 tablet (100 mg total) by mouth daily. 01/27/13  Yes Star Age, MD  triamcinolone cream (KENALOG) 0.1 % Apply 1 application topically daily as needed (skin irritations).  07/19/14  Yes Historical Provider, MD  valACYclovir (VALTREX) 500 MG tablet 1 po bid x 3 days prn. Patient taking differently: Take 500 mg by mouth daily as needed (outbreak).  08/15/14  Yes Shelly Bombard, MD  zolpidem (AMBIEN) 10 MG tablet Take 10 mg by mouth at bedtime.   Yes Historical Provider, MD  methocarbamol (ROBAXIN) 500 MG tablet Take 2 tablets (1,000 mg total) by  mouth 4 (four) times daily as needed (Pain). Patient not taking: Reported on 09/03/2014 04/19/14   Elmyra Ricks Pisciotta, PA-C  oxyCODONE (ROXICODONE) 5 MG immediate release tablet Take 1 tablet (5 mg total) by mouth every 4 (four) hours as needed for severe pain. Patient not taking: Reported on 09/03/2014 01/10/14   Harvie Heck, PA-C  oxyCODONE-acetaminophen (PERCOCET) 5-325 MG per tablet Take 1-2 tablets by mouth every 4 (four) hours as needed for severe pain. Patient not taking: Reported on 09/03/2014 08/27/14   Tatyana Kirichenko, PA-C  tiZANidine (ZANAFLEX) 4 MG tablet Take 1 tablet (4 mg total) by mouth at bedtime. Patient not taking: Reported on 08/27/2014 12/11/13   Trudie Buckler,  NP   BP 128/86 mmHg  Pulse 75  Temp(Src) 98.6 F (37 C) (Oral)  Resp 18  Ht 5\' 6"  (1.676 m)  Wt 307 lb (139.254 kg)  BMI 49.57 kg/m2  SpO2 98% Physical Exam  Constitutional: She is oriented to person, place, and time. She appears well-developed and well-nourished.  HENT:  Head: Normocephalic and atraumatic.  Eyes: Conjunctivae and EOM are normal. Pupils are equal, round, and reactive to light.  Neck: Normal range of motion. Neck supple.  Cardiovascular: Normal rate and regular rhythm.  Exam reveals no gallop and no friction rub.   No murmur heard. Pulmonary/Chest: Effort normal and breath sounds normal. No respiratory distress. She has no wheezes. She has no rales. She exhibits no tenderness.  Abdominal: Soft. Bowel sounds are normal. She exhibits no distension and no mass. There is no tenderness. There is no rebound and no guarding.  Musculoskeletal: Normal range of motion. She exhibits no edema or tenderness.  Neurological: She is alert and oriented to person, place, and time.  Skin: Skin is warm and dry.  Incision from prior I and D on left back, draining mild serosanguineous fluid, no purulent discharge, no surrounding erythema or cellulitis  Psychiatric: She has a normal mood and affect. Her behavior is  normal. Judgment and thought content normal.  Nursing note and vitals reviewed.   ED Course  Procedures (including critical care time) Labs Review Labs Reviewed - No data to display  Imaging Review No results found.   EKG Interpretation None      MDM   Final diagnoses:  Wound check, abscess    Patient with wound culture positive for MRSA. Instructed to return to the ED for wound check. Culture result is reviewed. She is sensitive to clindamycin. This is what the patient was prescribed on her initial presentation. Recommend continuing this. Symptoms have been improving. Recommend continued wound care and warm compresses. Vital signs are stable. She is generally well-appearing.  Filed Vitals:   09/03/14 1530  BP: 128/86  Pulse: 75  Temp: 98.6 F (37 C)  Resp: [address] PA-C 09/03/14 1639  Pattricia Boss, MD 09/05/14 1505

## 2014-09-03 NOTE — ED Notes (Signed)
Declined W/C at D/C and was escorted to lobby by RN. 

## 2014-09-07 ENCOUNTER — Encounter: Payer: Self-pay | Admitting: Neurology

## 2014-09-07 ENCOUNTER — Ambulatory Visit (INDEPENDENT_AMBULATORY_CARE_PROVIDER_SITE_OTHER): Payer: Medicaid Other | Admitting: Neurology

## 2014-09-07 VITALS — BP 127/82 | HR 74 | Ht 66.0 in | Wt 306.8 lb

## 2014-09-07 DIAGNOSIS — R32 Unspecified urinary incontinence: Secondary | ICD-10-CM

## 2014-09-07 DIAGNOSIS — R413 Other amnesia: Secondary | ICD-10-CM | POA: Diagnosis not present

## 2014-09-07 DIAGNOSIS — R269 Unspecified abnormalities of gait and mobility: Secondary | ICD-10-CM | POA: Diagnosis not present

## 2014-09-07 DIAGNOSIS — R3915 Urgency of urination: Secondary | ICD-10-CM

## 2014-09-07 DIAGNOSIS — G441 Vascular headache, not elsewhere classified: Secondary | ICD-10-CM

## 2014-09-07 DIAGNOSIS — R42 Dizziness and giddiness: Secondary | ICD-10-CM | POA: Diagnosis not present

## 2014-09-07 DIAGNOSIS — M4802 Spinal stenosis, cervical region: Secondary | ICD-10-CM | POA: Diagnosis not present

## 2014-09-07 DIAGNOSIS — R1314 Dysphagia, pharyngoesophageal phase: Secondary | ICD-10-CM

## 2014-09-07 HISTORY — DX: Spinal stenosis, cervical region: M48.02

## 2014-09-07 NOTE — Progress Notes (Signed)
Reason for visit: Gait disorder  Referring physician: Dr. Zachery Dakins is a 49 y.o. female  History of present illness:  Brenda Odonnell is a 49 year old right-handed black female with a history of obesity and a history of C5-6 cervical spondylosis. The patient was previously seen by Dr. Erling Cruz for evaluation of neck pain. The patient was referred to Dr. Joya Salm, and surgery was recommended, but the patient never had the surgery secondary to financial considerations. The patient has had ongoing issues over the last 2 years with fatigue. The patient believes that her balance has gradually worsened over time, and she uses a cane for ambulation. The patient will stumble, and occasionally fall. The patient has some pain with the left leg, she has a limping walk with the left leg, and she indicates that she may have some numbness down the leg and occasionally the leg will collapse on her. The patient has low back pain as well, and she has been set up for MRI evaluation of the low back. This study has not yet been done. The patient reports that she has a history of migraine headaches that occur 2 or 3 times a month, but she has another type of headache behind the eyes that is different from her usual migraine. This headache occurs on average 4 times a week lasting hours to all day long. The patient has no photophobia or phonophobia or nausea or vomiting with this headache. The patient may have some ringing in the ears. She does have photophobia and nausea with her migraine. The patient has some left face, arm, and leg numbness. The patient reports some issues with memory and concentration that has also declined over the last 2 years. The patient reports some urinary urgency, no incontinence. The patient denies any bowel control issues. She is sent to this office for further evaluation. She has a history of sleep apnea, currently on CPAP.  Past Medical History  Diagnosis Date  . Sinus infection   .  Hypertension   . Arthritis   . Spinal stenosis   . Glaucoma   . Carpal tunnel syndrome   . Depression   . Bipolar 2 disorder   . Obesity   . Arthritis   . Glaucoma   . Leiomyoma of uterus, unspecified   . Sleep apnea   . MS (multiple sclerosis)   . Migraine   . Spinal stenosis in cervical region 09/07/2014    C5-6 level    Past Surgical History  Procedure Laterality Date  . Carpal tunnel release Bilateral   . Abdominal hysterectomy    . Knee surgery Left     arthroscopic  . Myelogram    . Cesarean section    . Breast reduction surgery      Family History  Problem Relation Age of Onset  . Hypertension    . Hypertension Father   . Hyperlipidemia Father   . Fibromyalgia Mother   . Diabetes Mother   . Healthy Sister   . Healthy Brother     Social history:  reports that she has never smoked. She has never used smokeless tobacco. She reports that she does not drink alcohol or use illicit drugs.  Medications:  Prior to Admission medications   Medication Sig Start Date End Date Taking? Authorizing Provider  brimonidine-timolol (COMBIGAN) 0.2-0.5 % ophthalmic solution Place 1 drop into both eyes 2 (two) times daily.    Historical Provider, MD  clindamycin (CLEOCIN) 150 MG capsule Take 2 capsules (  300 mg total) by mouth 3 (three) times daily. 08/27/14   Tatyana Kirichenko, PA-C  clotrimazole (LOTRIMIN) 1 % cream Apply 1 application topically 2 (two) times daily. Patient taking differently: Apply 1 application topically 2 (two) times daily as needed (skin irritations).  04/02/14   Shelly Bombard, MD  diazepam (VALIUM) 10 MG tablet Take 10 mg by mouth daily as needed for anxiety (before MRI procedure).  07/10/14   Historical Provider, MD  diclofenac sodium (VOLTAREN) 1 % GEL Apply 2 g topically daily as needed. For knee and foot pain    Historical Provider, MD  dorzolamide (TRUSOPT) 2 % ophthalmic solution Place 1 drop into both eyes 2 (two) times daily.     Historical Provider,  MD  fluconazole (DIFLUCAN) 200 MG tablet Take 1 tablet (200 mg total) by mouth every other day. Patient taking differently: Take 200 mg by mouth daily as needed (infections).  04/02/14   Shelly Bombard, MD  FLUoxetine (PROZAC) 40 MG capsule Take 1 capsule (40 mg total) by mouth daily. 12/05/12   Ruben Im, PA-C  fluticasone (FLONASE) 50 MCG/ACT nasal spray Place 1 spray into both nostrils daily as needed for allergies or rhinitis.  06/06/14   Historical Provider, MD  furosemide (LASIX) 40 MG tablet Take 40 mg by mouth daily.    Historical Provider, MD  gabapentin (NEURONTIN) 600 MG tablet Take 600 mg by mouth 3 (three) times daily.    Historical Provider, MD  lamoTRIgine (LAMICTAL) 100 MG tablet Take 100 mg by mouth daily. Take with the 150mg  to make a total of 250mg  daily    Historical Provider, MD  lamoTRIgine (LAMICTAL) 150 MG tablet Take 150 mg by mouth daily. Take with the 100mg  tablet to make total of 250mg  daily    Historical Provider, MD  lisinopril (PRINIVIL,ZESTRIL) 40 MG tablet Take 1 tablet (40 mg total) by mouth daily. 12/05/12   Ruben Im, PA-C  LORazepam (ATIVAN) 0.5 MG tablet Take 0.5 mg by mouth 3 (three) times daily. Take three times every day per patient 12/05/12   Ruben Im, PA-C  methocarbamol (ROBAXIN) 500 MG tablet Take 2 tablets (1,000 mg total) by mouth 4 (four) times daily as needed (Pain). Patient not taking: Reported on 09/03/2014 04/19/14   Elmyra Ricks Pisciotta, PA-C  metroNIDAZOLE (FLAGYL) 500 MG tablet Take 1 tablet (500 mg total) by mouth 2 (two) times daily. Patient taking differently: Take 500 mg by mouth 2 (two) times daily as needed (infections).  04/03/14   Shelly Bombard, MD  omeprazole (PRILOSEC) 40 MG capsule Take 1 capsule (40 mg total) by mouth daily. 12/05/12   Ruben Im, PA-C  ondansetron (ZOFRAN) 4 MG tablet Take 4 mg by mouth every 8 (eight) hours as needed for nausea or vomiting.    Historical Provider, MD  oxyCODONE (ROXICODONE) 5 MG  immediate release tablet Take 1 tablet (5 mg total) by mouth every 4 (four) hours as needed for severe pain. Patient not taking: Reported on 09/03/2014 01/10/14   Harvie Heck, PA-C  oxyCODONE-acetaminophen (PERCOCET) 10-325 MG per tablet Take 1 tablet by mouth every 6 (six) hours as needed for pain.  08/16/14   Historical Provider, MD  oxyCODONE-acetaminophen (PERCOCET) 5-325 MG per tablet Take 1-2 tablets by mouth every 4 (four) hours as needed for severe pain. Patient not taking: Reported on 09/03/2014 08/27/14   Jeannett Senior, PA-C  phentermine 37.5 MG capsule Take 37.5 mg by mouth daily.    Historical Provider, MD  tiZANidine (ZANAFLEX) 4 MG tablet Take 1 tablet (4 mg total) by mouth at bedtime. Patient not taking: Reported on 08/27/2014 12/11/13   Trudie Buckler, NP  topiramate (TOPAMAX) 100 MG tablet Take 1 tablet (100 mg total) by mouth daily. 01/27/13   Star Age, MD  triamcinolone cream (KENALOG) 0.1 % Apply 1 application topically daily as needed (skin irritations).  07/19/14   Historical Provider, MD  valACYclovir (VALTREX) 500 MG tablet 1 po bid x 3 days prn. Patient taking differently: Take 500 mg by mouth daily as needed (outbreak).  08/15/14   Shelly Bombard, MD  zolpidem (AMBIEN) 10 MG tablet Take 10 mg by mouth at bedtime.    Historical Provider, MD      Allergies  Allergen Reactions  . Ibuprofen Nausea And Vomiting    Due to gird    ROS:  Out of a complete 14 system review of symptoms, the patient complains only of the following symptoms, and all other reviewed systems are negative.  Fatigue, excessive sweating Ringing in the ears, difficulty swallowing Loss of vision, blurred vision Heat intolerance Nausea Restless legs, sleep apnea Incontinence of the bladder Joint pain, back pain, achy muscles, muscle cramps, walking difficulty, neck pain, neck stiffness Moles Memory loss, dizziness, headache, numbness, weakness Confusion, decreased concentration, depression,  anxiety  Blood pressure 127/82, pulse 74, height 5\' 6"  (1.676 m), weight 306 lb 12.8 oz (139.164 kg).  Physical Exam  General: The patient is alert and cooperative at the time of the examination. The patient is markedly obese.  Eyes: Pupils are equal, round, and reactive to light. Discs are flat bilaterally.  Neck: The neck is supple, no carotid bruits are noted.  Respiratory: The respiratory examination is clear.  Cardiovascular: The cardiovascular examination reveals a regular rate and rhythm, no obvious murmurs or rubs are noted.  Skin: Extremities are without significant edema.  Neurologic Exam  Mental status: The patient is alert and oriented x 3 at the time of the examination. The patient has apparent normal recent and remote memory, with an apparently normal attention span and concentration ability.  Cranial nerves: Facial symmetry is present. There is good sensation of the face to pinprick and soft touch on the right, decreased on the left. The patient does not split the midline with vibration sensation.. The strength of the facial muscles and the muscles to head turning and shoulder shrug are normal bilaterally. Speech is well enunciated, no aphasia or dysarthria is noted. Extraocular movements are full. Visual fields are full. The tongue is midline, and the patient has symmetric elevation of the soft palate. No obvious hearing deficits are noted.  Motor: The motor testing reveals 5 over 5 strength of all 4 extremities. Good symmetric motor tone is noted throughout.  Sensory: Sensory testing is intact to pinprick, soft touch, vibration sensation, and position sense on all 4 extremities, with exception that there is some decrease in pinprick sensation on the left arm and leg, decreased vibration sensation on the left arm. No evidence of extinction is noted.  Coordination: Cerebellar testing reveals good finger-nose-finger and heel-to-shin bilaterally.  Gait and station: Gait is  associated with a limping quality on the left leg. The patient uses a cane for ambulation. Tandem gait is slightly unsteady. Romberg is negative. No drift is seen.  Reflexes: Deep tendon reflexes are symmetric, but are depressed bilaterally. Toes are downgoing bilaterally.   MRI brain 05/19/12:  MRI HEAD  Findings: Due to the patient's body habitus, she was  able to cooperate for only a limited number pulse sequences. The overall study is diagnostic for exclusion of acute stroke.  There is no evidence for acute infarction, intracranial hemorrhage, mass lesion, hydrocephalus, or extra-axial fluid. There is no atrophy or significant white matter disease. Midline structures are unremarkable. Flow voids are maintained in the major intracranial vessels. No acute sinus, orbital, or mastoid disease.  IMPRESSION: No acute stroke is evident.  * MRI scan images were reviewed online. I agree with the written report.     Assessment/Plan:  1. Sleep apnea on CPAP  2. Marked obesity  3. Cervical spondylosis, C5-6 level  4. Mild gait instability  5. 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 Left hemisensory deficit by examination  The patient has had a prior MRI study in 2014 that was unremarkable. The patient is referred to this office with considerations for possible multiple sclerosis. I think this is unlikely, but the patient is reporting a multitude of somatic complaints. The patient reports memory issues, with left-sided numbness. The patient has cervical spinal stenosis, this has not been surgically treated. The patient be set up for MRI of the brain, and cervical spine. Blood work will be done today. She will follow-up in 3 or 4 months.  Jill Alexanders MD 09/07/2014 8:14 PM  [facility] [address] [place], [place] [address]  Phone [phone] Fax [phone]

## 2014-09-07 NOTE — Patient Instructions (Signed)

## 2014-09-08 LAB — TSH: TSH: 2.15 u[IU]/mL (ref 0.450–4.500)

## 2014-09-08 LAB — VITAMIN B12: Vitamin B-12: 388 pg/mL (ref 211–946)

## 2014-09-08 LAB — COPPER, SERUM: COPPER: 187 ug/dL — AB (ref 72–166)

## 2014-09-08 LAB — SEDIMENTATION RATE: Sed Rate: 61 mm/hr — ABNORMAL HIGH (ref 0–32)

## 2014-09-08 LAB — RPR: RPR Ser Ql: NONREACTIVE

## 2014-09-10 ENCOUNTER — Telehealth: Payer: Self-pay | Admitting: Neurology

## 2014-09-10 NOTE — Telephone Encounter (Signed)
I called the patient. The blood work was unremarkable with exception that the sedimentation rate was slightly elevated. The patient just got over being treated for MRSA. We will plan on rechecking the sedimentation rate in 4 weeks.

## 2014-09-26 ENCOUNTER — Other Ambulatory Visit: Payer: Self-pay

## 2014-09-26 ENCOUNTER — Inpatient Hospital Stay: Admission: RE | Admit: 2014-09-26 | Payer: Self-pay | Source: Ambulatory Visit

## 2014-10-07 ENCOUNTER — Telehealth: Payer: Self-pay | Admitting: Neurology

## 2014-10-07 DIAGNOSIS — R413 Other amnesia: Secondary | ICD-10-CM

## 2014-10-07 DIAGNOSIS — G441 Vascular headache, not elsewhere classified: Secondary | ICD-10-CM

## 2014-10-07 NOTE — Telephone Encounter (Signed)
-----   Message from Kathrynn Ducking, MD sent at 09/10/2014  7:56 AM EDT ----- Recheck sedimentation rate

## 2014-10-07 NOTE — Telephone Encounter (Signed)
I called the patient. We will recheck the ESR, last study was 61. I will put in the order, the patient is to come in.

## 2014-10-22 ENCOUNTER — Encounter (INDEPENDENT_AMBULATORY_CARE_PROVIDER_SITE_OTHER): Payer: Medicaid Other | Admitting: Diagnostic Neuroimaging

## 2014-10-22 ENCOUNTER — Ambulatory Visit
Admission: RE | Admit: 2014-10-22 | Discharge: 2014-10-22 | Disposition: A | Discharge status: Home / Self Care | Payer: Medicaid Other | Source: Ambulatory Visit | Attending: Neurology | Admitting: Neurology

## 2014-10-22 DIAGNOSIS — G441 Vascular headache, not elsewhere classified: Secondary | ICD-10-CM

## 2014-10-22 DIAGNOSIS — R42 Dizziness and giddiness: Secondary | ICD-10-CM

## 2014-10-22 DIAGNOSIS — R3915 Urgency of urination: Secondary | ICD-10-CM

## 2014-10-22 DIAGNOSIS — M4802 Spinal stenosis, cervical region: Secondary | ICD-10-CM

## 2014-10-22 DIAGNOSIS — R32 Unspecified urinary incontinence: Secondary | ICD-10-CM

## 2014-10-22 DIAGNOSIS — R413 Other amnesia: Secondary | ICD-10-CM

## 2014-10-22 DIAGNOSIS — R269 Unspecified abnormalities of gait and mobility: Secondary | ICD-10-CM

## 2014-10-22 DIAGNOSIS — R1314 Dysphagia, pharyngoesophageal phase: Secondary | ICD-10-CM

## 2014-10-23 ENCOUNTER — Telehealth: Payer: Self-pay | Admitting: Neurology

## 2014-10-23 NOTE — Telephone Encounter (Signed)
I called patient. MRI the brain was normal, no evidence of MS. I discussed this with the patient.   MRI brain 10/23/14:   IMPRESSION:  Normal MRI brain (without). No change from MRI on 05/19/12.

## 2014-10-26 ENCOUNTER — Ambulatory Visit (INDEPENDENT_AMBULATORY_CARE_PROVIDER_SITE_OTHER): Payer: Medicaid Other | Admitting: Obstetrics

## 2014-10-26 ENCOUNTER — Encounter: Payer: Self-pay | Admitting: Obstetrics

## 2014-10-26 VITALS — BP 134/90 | HR 83 | Temp 97.5°F | Ht 66.0 in | Wt 289.0 lb

## 2014-10-26 DIAGNOSIS — J40 Bronchitis, not specified as acute or chronic: Secondary | ICD-10-CM

## 2014-10-26 DIAGNOSIS — Z113 Encounter for screening for infections with a predominantly sexual mode of transmission: Secondary | ICD-10-CM

## 2014-10-26 DIAGNOSIS — K219 Gastro-esophageal reflux disease without esophagitis: Secondary | ICD-10-CM | POA: Diagnosis not present

## 2014-10-26 DIAGNOSIS — N390 Urinary tract infection, site not specified: Secondary | ICD-10-CM

## 2014-10-26 DIAGNOSIS — R102 Pelvic and perineal pain: Secondary | ICD-10-CM | POA: Diagnosis not present

## 2014-10-26 DIAGNOSIS — Z1239 Encounter for other screening for malignant neoplasm of breast: Secondary | ICD-10-CM | POA: Diagnosis not present

## 2014-10-26 LAB — POCT URINALYSIS DIPSTICK
Bilirubin, UA: NEGATIVE
Glucose, UA: NEGATIVE
Ketones, UA: NEGATIVE
LEUKOCYTES UA: NEGATIVE
NITRITE UA: NEGATIVE
PROTEIN UA: NEGATIVE
SPEC GRAV UA: 1.015
Urobilinogen, UA: NEGATIVE
pH, UA: 5

## 2014-10-26 MED ORDER — OMEPRAZOLE 40 MG PO CPDR: 40.0000 mg | DELAYED_RELEASE_CAPSULE | Freq: Every day | ORAL | Status: AC

## 2014-10-26 MED ORDER — AZITHROMYCIN 250 MG PO TABS: ORAL_TABLET | ORAL | Status: DC

## 2014-10-26 MED ORDER — ONDANSETRON HCL 4 MG PO TABS: 4.0000 mg | ORAL_TABLET | Freq: Three times a day (TID) | ORAL | Status: AC | PRN

## 2014-10-26 NOTE — Addendum Note (Signed)
Addended by: Lewie Loron D on: 10/26/2014 01:22 PM   Modules accepted: Orders

## 2014-10-26 NOTE — Addendum Note (Signed)
Addended by: Lewie Loron D on: 10/26/2014 01:31 PM   Modules accepted: Orders

## 2014-10-26 NOTE — Progress Notes (Signed)
[name], female   DOB: 1966/03/05, 48 y.o.   MRN: [id number]  Chief Complaint  Patient presents with  . Abdominal Pain    HPI Brenda Odonnell is a 49 y.o. female.  C/O left lower abdominal pain for past 2 weeks.  She is morbidly obese and has multiple medical and physical problems, including spinal stenosis, arthritis, and depression.  She is on chronic opioids for pain.  PSH significant for TAH, knee surgery, breast reduction and carpal tunnel release.  Denies N/V and diarrhea, but does have constipation related to chronic opioid intake.  Currently also has had a bronchitis for past 2 weeks. HPI  Past Medical History  Diagnosis Date  . Sinus infection   . Hypertension   . Arthritis   . Spinal stenosis   . Glaucoma   . Carpal tunnel syndrome   . Depression   . Bipolar 2 disorder   . Obesity   . Arthritis   . Glaucoma   . Leiomyoma of uterus, unspecified   . Sleep apnea   . MS (multiple sclerosis)   . Migraine   . Spinal stenosis in cervical region 09/07/2014    C5-6 level    Past Surgical History  Procedure Laterality Date  . Carpal tunnel release Bilateral   . Abdominal hysterectomy    . Knee surgery Left     arthroscopic  . Myelogram    . Cesarean section    . Breast reduction surgery      Family History  Problem Relation Age of Onset  . Hypertension    . Hypertension Father   . Hyperlipidemia Father   . Fibromyalgia Mother   . Diabetes Mother   . Healthy Sister   . Healthy Brother     Social History History  Substance Use Topics  . Smoking status: Never Smoker   . Smokeless tobacco: Never Used  . Alcohol Use: No    Allergies  Allergen Reactions  . Ibuprofen Nausea And Vomiting    Due to gird    Current Outpatient Prescriptions  Medication Sig Dispense Refill  . amphetamine-dextroamphetamine (ADDERALL XR) 30 MG 24 hr capsule Take 30 mg by mouth daily.    . brimonidine-timolol (COMBIGAN) 0.2-0.5 % ophthalmic solution Place 1 drop  into both eyes 2 (two) times daily.    . clotrimazole (LOTRIMIN) 1 % cream Apply 1 application topically 2 (two) times daily. (Patient taking differently: Apply 1 application topically 2 (two) times daily as needed (skin irritations). ) 113 g 2  . diclofenac sodium (VOLTAREN) 1 % GEL Apply 2 g topically daily as needed. For knee and foot pain    . dorzolamide (TRUSOPT) 2 % ophthalmic solution Place 1 drop into both eyes 2 (two) times daily.     . fluticasone (FLONASE) 50 MCG/ACT nasal spray Place 1 spray into both nostrils daily as needed for allergies or rhinitis.   0  . furosemide (LASIX) 40 MG tablet Take 40 mg by mouth daily.    Marland Kitchen gabapentin (NEURONTIN) 600 MG tablet Take 600 mg by mouth 3 (three) times daily.    Marland Kitchen lamoTRIgine (LAMICTAL) 100 MG tablet Take 100 mg by mouth daily. Take with the 150mg  to make a total of 250mg  daily    . lamoTRIgine (LAMICTAL) 150 MG tablet Take 150 mg by mouth daily. Take with the 100mg  tablet to make total of 250mg  daily    . lisinopril (PRINIVIL,ZESTRIL) 40 MG tablet Take 1 tablet (40 mg total) by mouth daily.    Marland Kitchen  LORazepam (ATIVAN) 0.5 MG tablet Take 0.5 mg by mouth 3 (three) times daily. Take three times every day per patient    . omeprazole (PRILOSEC) 40 MG capsule Take 1 capsule (40 mg total) by mouth daily. 30 capsule 11  . ondansetron (ZOFRAN) 4 MG tablet Take 1 tablet (4 mg total) by mouth every 8 (eight) hours as needed for nausea or vomiting. 30 tablet 2  . oxyCODONE-acetaminophen (PERCOCET) 10-325 MG per tablet Take 1 tablet by mouth every 6 (six) hours as needed for pain.   0  . topiramate (TOPAMAX) 100 MG tablet Take 1 tablet (100 mg total) by mouth daily. 30 tablet 1  . triamcinolone cream (KENALOG) 0.1 % Apply 1 application topically daily as needed (skin irritations).   0  . valACYclovir (VALTREX) 500 MG tablet 1 po bid x 3 days prn. (Patient taking differently: Take 500 mg by mouth daily as needed (outbreak). ) 30 tablet 11  . zolpidem (AMBIEN)  10 MG tablet Take 10 mg by mouth at bedtime.    Marland Kitchen azithromycin (ZITHROMAX Z-PAK) 250 MG tablet Take 2 tablets po load, then 1 tablet po daily. 15 tablet 1  . diazepam (VALIUM) 10 MG tablet Take 10 mg by mouth daily as needed for anxiety (before MRI procedure).   0  . FLUoxetine (PROZAC) 40 MG capsule Take 1 capsule (40 mg total) by mouth daily. (Patient not taking: Reported on 10/26/2014) 30 capsule 0  . methocarbamol (ROBAXIN) 500 MG tablet Take 2 tablets (1,000 mg total) by mouth 4 (four) times daily as needed (Pain). (Patient not taking: Reported on 10/26/2014) 20 tablet 0  . metroNIDAZOLE (FLAGYL) 500 MG tablet Take 1 tablet (500 mg total) by mouth 2 (two) times daily. (Patient not taking: Reported on 10/26/2014) 14 tablet 2  . phentermine 37.5 MG capsule Take 37.5 mg by mouth daily.     No current facility-administered medications for this visit.    Review of Systems Review of Systems Constitutional: negative for fatigue and weight loss Respiratory: positive for cough and wheezing Cardiovascular: negative for chest pain, fatigue and palpitations Gastrointestinal: positive for abdominal pain and change in bowel habits Genitourinary: positive for pelvic pain Integument/breast: negative for nipple discharge Musculoskeletal: positive for myalgias Neurological: positive for gait problems and tremors Behavioral/Psych: positive for depression Endocrine: negative for temperature intolerance     Blood pressure 134/90, pulse 83, temperature 97.5 F (36.4 C), height 5\' 6"  (1.676 m), weight 289 lb (131.09 kg).  Physical Exam Physical Exam General:   alert  Skin:   no rash or abnormalities  Lungs:   clear to auscultation bilaterally  Heart:   regular rate and rhythm, S1, S2 normal, no murmur, click, rub or gallop  Breasts:   normal without suspicious masses, skin or nipple changes or axillary nodes  Abdomen:  normal findings: no organomegaly, soft, non-tender and no hernia  Pelvis:  External  genitalia: normal general appearance Urinary system: urethral meatus normal and bladder without fullness, nontender Vaginal: normal without tenderness, induration or masses Cervix: normal appearance Adnexa: normal bimanual exam Uterus: anteverted and non-tender, normal size    Assessment: Pelvic pain.  ? Etiology. Chronic pain syndrome Arthritis Depression Spinal stenosis Morbid obesity  Bronchitis H/O Multiple Sclerosis    Plan    Ultrasound ordered Mammogram ordered Defer treatment of medical problems to PCP. F/U in 2 weeks  Orders Placed This Encounter  Procedures  . Urine Culture  . US Transvaginal Non-OB    Standing Status: Future  Number of Occurrences:      Standing Expiration Date: 12/26/2015    Order Specific Question:  Reason for Exam (SYMPTOM  OR DIAGNOSIS REQUIRED)    Answer:  Pelvic pain.  LLQ.    Order Specific Question:  Preferred imaging location?    Answer:  [facility]  . US Pelvis Complete    Standing Status: Future     Number of Occurrences:      Standing Expiration Date: 12/26/2015    Order Specific Question:  Reason for Exam (SYMPTOM  OR DIAGNOSIS REQUIRED)    Answer:  Pelvic pain.  LLQ.    Order Specific Question:  Preferred imaging location?    Answer:  [facility]  . MM Digital Screening    Standing Status: Future     Number of Occurrences:      Standing Expiration Date: 12/26/2015    Order Specific Question:  Reason for Exam (SYMPTOM  OR DIAGNOSIS REQUIRED)    Answer:  Screening.    Order Specific Question:  Is the patient pregnant?    Answer:  No    Order Specific Question:  Preferred imaging location?    Answer:  [facility]  . HIV antibody  . Hepatitis B surface antigen  . RPR  . Hepatitis C antibody   Meds ordered this encounter  Medications  . amphetamine-dextroamphetamine (ADDERALL XR) 30 MG 24 hr capsule    Sig: Take 30 mg by mouth daily.  Marland Kitchen azithromycin (ZITHROMAX Z-PAK) 250 MG tablet    Sig: Take 2  tablets po load, then 1 tablet po daily.    Dispense:  15 tablet    Refill:  1  . ondansetron (ZOFRAN) 4 MG tablet    Sig: Take 1 tablet (4 mg total) by mouth every 8 (eight) hours as needed for nausea or vomiting.    Dispense:  30 tablet    Refill:  2  . omeprazole (PRILOSEC) 40 MG capsule    Sig: Take 1 capsule (40 mg total) by mouth daily.    Dispense:  30 capsule    Refill:  11

## 2014-10-27 ENCOUNTER — Emergency Department (HOSPITAL_COMMUNITY): Payer: Medicare Other

## 2014-10-27 ENCOUNTER — Encounter (HOSPITAL_COMMUNITY): Payer: Self-pay | Admitting: *Deleted

## 2014-10-27 ENCOUNTER — Emergency Department (HOSPITAL_COMMUNITY)
Admission: EM | Admit: 2014-10-27 | Discharge: 2014-10-27 | Disposition: A | Discharge status: Home / Self Care | Payer: Medicare Other | Attending: Emergency Medicine | Admitting: Emergency Medicine

## 2014-10-27 DIAGNOSIS — G8929 Other chronic pain: Secondary | ICD-10-CM | POA: Insufficient documentation

## 2014-10-27 DIAGNOSIS — Z8709 Personal history of other diseases of the respiratory system: Secondary | ICD-10-CM | POA: Insufficient documentation

## 2014-10-27 DIAGNOSIS — G43909 Migraine, unspecified, not intractable, without status migrainosus: Secondary | ICD-10-CM | POA: Diagnosis not present

## 2014-10-27 DIAGNOSIS — H409 Unspecified glaucoma: Secondary | ICD-10-CM | POA: Insufficient documentation

## 2014-10-27 DIAGNOSIS — E669 Obesity, unspecified: Secondary | ICD-10-CM | POA: Insufficient documentation

## 2014-10-27 DIAGNOSIS — I10 Essential (primary) hypertension: Secondary | ICD-10-CM | POA: Insufficient documentation

## 2014-10-27 DIAGNOSIS — Z79899 Other long term (current) drug therapy: Secondary | ICD-10-CM | POA: Diagnosis not present

## 2014-10-27 DIAGNOSIS — M25562 Pain in left knee: Secondary | ICD-10-CM

## 2014-10-27 DIAGNOSIS — F329 Major depressive disorder, single episode, unspecified: Secondary | ICD-10-CM | POA: Diagnosis not present

## 2014-10-27 DIAGNOSIS — Z86018 Personal history of other benign neoplasm: Secondary | ICD-10-CM | POA: Diagnosis not present

## 2014-10-27 LAB — RPR

## 2014-10-27 LAB — HEPATITIS C ANTIBODY: HCV Ab: NEGATIVE

## 2014-10-27 LAB — HIV ANTIBODY (ROUTINE TESTING W REFLEX): HIV 1&2 Ab, 4th Generation: NONREACTIVE

## 2014-10-27 LAB — HEPATITIS B SURFACE ANTIGEN: Hepatitis B Surface Ag: NEGATIVE

## 2014-10-27 MED ORDER — ONDANSETRON 4 MG PO TBDP
4.0000 mg | ORAL_TABLET | Freq: Once | ORAL | Status: AC
  Administered 2014-10-27: 4 mg via ORAL
  Filled 2014-10-27: qty 1

## 2014-10-27 MED ORDER — HYDROMORPHONE HCL 1 MG/ML IJ SOLN
2.0000 mg | Freq: Once | INTRAMUSCULAR | Status: AC
  Administered 2014-10-27: 2 mg via INTRAMUSCULAR
  Filled 2014-10-27: qty 2

## 2014-10-27 NOTE — ED Notes (Signed)
Pt reports chronic left knee pain that has become more severe since yesterday, no relief with pain meds at home.

## 2014-10-27 NOTE — ED Notes (Signed)
NO Education officer, museum available at this time. Charge RN aware.

## 2014-10-27 NOTE — ED Notes (Signed)
Pt able to ambulate, favoring her left leg heavily. Pt states that she is unable to put direct pressure on her leg, because the pain shoots up her leg. Pt able to limp with support from 2 staff.

## 2014-10-27 NOTE — ED Notes (Signed)
Pt given ace wrap for comfort.

## 2014-10-27 NOTE — ED Notes (Signed)
Pt stated " I just wanna leave". This tech asked pt multiple times if she was sure she still stated "i just wanna leave." This tech complied and wheeled pt out.

## 2014-10-27 NOTE — ED Provider Notes (Signed)
CSN: [id number]     Arrival date & time 10/27/14  1738 History   This chart was scribed for Alyse Low, PA-C working with Evelina Bucy, MD by Mercy Moore, ED Scribe. This patient was seen in room TR10C/TR10C and the patient's care was started at 6:07 PM.   Chief Complaint  Patient presents with  . Knee Pain   The history is provided by the patient. No language interpreter was used.   HPI Comments: Brenda Odonnell is a 49 y.o. female with PMHx including arthritis, left knee arthroscopic, obesity, MS, and Hypertension who presents to the Emergency Department complaining of acute on chronic left knee pain since yesterday. Patient reports stabbing, shooting pain at the medial aspect, pounding pain to anterior knee with flexion and throbbing pain at rest. Patient reports difficulty bearing weight and walking; she ambulates with cane at baseline. Patient reports treatment with at home with an array of pain medications including oxycodone and gabapentin, Voltaren cream, and RICE protocol without relief. Patient's orthopedist is Dr. Karolee Ohs who has recommended surgery for her. Patient dates most recent imaging in 07/2014. Patient denies new injury, falls or trauma.   Past Medical History  Diagnosis Date  . Sinus infection   . Hypertension   . Arthritis   . Spinal stenosis   . Glaucoma   . Carpal tunnel syndrome   . Depression   . Bipolar 2 disorder   . Obesity   . Arthritis   . Glaucoma   . Leiomyoma of uterus, unspecified   . Sleep apnea   . MS (multiple sclerosis)   . Migraine   . Spinal stenosis in cervical region 09/07/2014    C5-6 level   Past Surgical History  Procedure Laterality Date  . Carpal tunnel release Bilateral   . Abdominal hysterectomy    . Knee surgery Left     arthroscopic  . Myelogram    . Cesarean section    . Breast reduction surgery     Family History  Problem Relation Age of Onset  . Hypertension    . Hypertension Father   . Hyperlipidemia Father   .  Fibromyalgia Mother   . Diabetes Mother   . Healthy Sister   . Healthy Brother    History  Substance Use Topics  . Smoking status: Never Smoker   . Smokeless tobacco: Never Used  . Alcohol Use: No   OB History    Gravida Para Term Preterm AB TAB SAB Ectopic Multiple Living   3 3 3       3      Review of Systems  Constitutional: Negative for fever and chills.  Musculoskeletal: Positive for arthralgias. Negative for joint swelling.  Neurological: Negative for weakness and numbness.      Allergies  Ibuprofen  Home Medications   Prior to Admission medications   Medication Sig Start Date End Date Taking? Authorizing Provider  amphetamine-dextroamphetamine (ADDERALL XR) 30 MG 24 hr capsule Take 30 mg by mouth daily.    Historical Provider, MD  azithromycin (ZITHROMAX Z-PAK) 250 MG tablet Take 2 tablets po load, then 1 tablet po daily. 10/26/14   Shelly Bombard, MD  brimonidine-timolol (COMBIGAN) 0.2-0.5 % ophthalmic solution Place 1 drop into both eyes 2 (two) times daily.    Historical Provider, MD  clotrimazole (LOTRIMIN) 1 % cream Apply 1 application topically 2 (two) times daily. Patient taking differently: Apply 1 application topically 2 (two) times daily as needed (skin irritations).  04/02/14   Clenton Pare  Jodi Mourning, MD  diazepam (VALIUM) 10 MG tablet Take 10 mg by mouth daily as needed for anxiety (before MRI procedure).  07/10/14   Historical Provider, MD  diclofenac sodium (VOLTAREN) 1 % GEL Apply 2 g topically daily as needed. For knee and foot pain    Historical Provider, MD  dorzolamide (TRUSOPT) 2 % ophthalmic solution Place 1 drop into both eyes 2 (two) times daily.     Historical Provider, MD  FLUoxetine (PROZAC) 40 MG capsule Take 1 capsule (40 mg total) by mouth daily. Patient not taking: Reported on 10/26/2014 12/05/12   Marlane Hatcher Mashburn, PA-C  fluticasone [facility]) 50 MCG/ACT nasal spray Place 1 spray into both nostrils daily as needed for allergies or rhinitis.  06/06/14    Historical Provider, MD  furosemide (LASIX) 40 MG tablet Take 40 mg by mouth daily.    Historical Provider, MD  gabapentin (NEURONTIN) 600 MG tablet Take 600 mg by mouth 3 (three) times daily.    Historical Provider, MD  lamoTRIgine (LAMICTAL) 100 MG tablet Take 100 mg by mouth daily. Take with the 150mg  to make a total of 250mg  daily    Historical Provider, MD  lamoTRIgine (LAMICTAL) 150 MG tablet Take 150 mg by mouth daily. Take with the 100mg  tablet to make total of 250mg  daily    Historical Provider, MD  lisinopril (PRINIVIL,ZESTRIL) 40 MG tablet Take 1 tablet (40 mg total) by mouth daily. 12/05/12   Ruben Im, PA-C  LORazepam (ATIVAN) 0.5 MG tablet Take 0.5 mg by mouth 3 (three) times daily. Take three times every day per patient 12/05/12   Ruben Im, PA-C  methocarbamol (ROBAXIN) 500 MG tablet Take 2 tablets (1,000 mg total) by mouth 4 (four) times daily as needed (Pain). Patient not taking: Reported on 10/26/2014 04/19/14   Elmyra Ricks Pisciotta, PA-C  metroNIDAZOLE (FLAGYL) 500 MG tablet Take 1 tablet (500 mg total) by mouth 2 (two) times daily. Patient not taking: Reported on 10/26/2014 04/03/14   Shelly Bombard, MD  omeprazole (PRILOSEC) 40 MG capsule Take 1 capsule (40 mg total) by mouth daily. 10/26/14   Shelly Bombard, MD  ondansetron (ZOFRAN) 4 MG tablet Take 1 tablet (4 mg total) by mouth every 8 (eight) hours as needed for nausea or vomiting. 10/26/14   Shelly Bombard, MD  oxyCODONE-acetaminophen (PERCOCET) 10-325 MG per tablet Take 1 tablet by mouth every 6 (six) hours as needed for pain.  08/16/14   Historical Provider, MD  phentermine 37.5 MG capsule Take 37.5 mg by mouth daily.    Historical Provider, MD  topiramate (TOPAMAX) 100 MG tablet Take 1 tablet (100 mg total) by mouth daily. 01/27/13   Star Age, MD  triamcinolone cream (KENALOG) 0.1 % Apply 1 application topically daily as needed (skin irritations).  07/19/14   Historical Provider, MD  valACYclovir (VALTREX) 500 MG  tablet 1 po bid x 3 days prn. Patient taking differently: Take 500 mg by mouth daily as needed (outbreak).  08/15/14   Shelly Bombard, MD  zolpidem (AMBIEN) 10 MG tablet Take 10 mg by mouth at bedtime.    Historical Provider, MD   Triage Vitals: BP 141/90 mmHg  Pulse 96  Temp(Src) 97.5 F (36.4 C) (Oral)  Resp 18  Ht 5\' 6"  (1.676 m)  Wt 289 lb (131.09 kg)  BMI 46.67 kg/m2  SpO2 98% Physical Exam  Constitutional: She is oriented to person, place, and time. She appears well-developed and well-nourished. No distress.  HENT:  Head:  Normocephalic and atraumatic.  Eyes: EOM are normal.  Neck: Neck supple. No tracheal deviation present.  Cardiovascular: Normal rate.   Pulmonary/Chest: Effort normal. No respiratory distress.  Musculoskeletal: Normal range of motion.  Neurological: She is alert and oriented to person, place, and time.  Skin: Skin is warm and dry.  Psychiatric: She has a normal mood and affect. Her behavior is normal.  Nursing note and vitals reviewed.   ED Course  Procedures (including critical care time)  COORDINATION OF CARE: 6:14 PM- Patient requests imaging. Discussed treatment plan with patient at bedside and patient agreed to plan.   Labs Review Labs Reviewed - No data to display  Imaging Review Dg Knee 2 Views Left  10/27/2014   CLINICAL DATA:  Pt reports chronic left knee pain that has become more severe since yesterday, no relief with pain meds at home. Hx arthritis  EXAM: LEFT KNEE - 1-2 VIEW  COMPARISON:  None.  FINDINGS: Mild osteophyte formation of the lateral compartment. Severe narrowing and moderate osteophyte formation of the medial compartment with severe narrowing and severe osteophyte formation of the patellofemoral compartment. Large posterior osteophytes present. No definite joint effusion. No fracture or dislocation.  IMPRESSION: Severe arthritis   Electronically Signed   By: Skipper Cliche M.D.   On: 10/27/2014 18:43     EKG  Interpretation None      MDM Pt asking for rx for dilaudid.   Database shows multiple controlled rx's  Pt advised to resume her percocet.  Discuss pain management with her MD.     Final diagnoses:  Knee pain, left    Pt given dilaudid and zofran IM Rx for a walker Pt advised to call Dr. Rush Farmer to be seen for evaluation  I personally performed the services in this documentation, which was scribed in my presence.  The recorded information has been reviewed and considered.   Ronnald Collum.   Fransico Meadow, PA-C 10/27/14 [place], PA-C 10/27/14 1950  Evelina Bucy, MD 10/27/14 2131

## 2014-10-27 NOTE — ED Notes (Signed)
Pt requested Dilaudid at time of D/C to take at home.

## 2014-10-27 NOTE — ED Notes (Signed)
Pt sitting in W/C and states " I am not leaving because I can not walk."

## 2014-10-27 NOTE — ED Notes (Signed)
Per Estill Bamberg, EMT pt has decided to leave the department.

## 2014-10-27 NOTE — ED Notes (Signed)
Pt reports she is supposed to have surgery and wants to have it today.

## 2014-10-27 NOTE — Discharge Instructions (Signed)
Arthralgia °Your caregiver has diagnosed you as suffering from an arthralgia. Arthralgia means there is pain in a joint. This can come from many reasons including: °· Bruising the joint which causes soreness (inflammation) in the joint. °· Wear and tear on the joints which occur as we grow older (osteoarthritis). °· Overusing the joint. °· Various forms of arthritis. °· Infections of the joint. °Regardless of the cause of pain in your joint, most of these different pains respond to anti-inflammatory drugs and rest. The exception to this is when a joint is infected, and these cases are treated with antibiotics, if it is a bacterial infection. °HOME CARE INSTRUCTIONS  °· Rest the injured area for as long as directed by your caregiver. Then slowly start using the joint as directed by your caregiver and as the pain allows. Crutches as directed may be useful if the ankles, knees or hips are involved. If the knee was splinted or casted, continue use and care as directed. If an stretchy or elastic wrapping bandage has been applied today, it should be removed and re-applied every 3 to 4 hours. It should not be applied tightly, but firmly enough to keep swelling down. Watch toes and feet for swelling, bluish discoloration, coldness, numbness or excessive pain. If any of these problems (symptoms) occur, remove the ace bandage and re-apply more loosely. If these symptoms persist, contact your caregiver or return to this location. °· For the first 24 hours, keep the injured extremity elevated on pillows while lying down. °· Apply ice for 15-20 minutes to the sore joint every couple hours while awake for the first half day. Then 03-04 times per day for the first 48 hours. Put the ice in a plastic bag and place a towel between the bag of ice and your skin. °· Wear any splinting, casting, elastic bandage applications, or slings as instructed. °· Only take over-the-counter or prescription medicines for pain, discomfort, or fever as  directed by your caregiver. Do not use aspirin immediately after the injury unless instructed by your physician. Aspirin can cause increased bleeding and bruising of the tissues. °· If you were given crutches, continue to use them as instructed and do not resume weight bearing on the sore joint until instructed. °Persistent pain and inability to use the sore joint as directed for more than 2 to 3 days are warning signs indicating that you should see a caregiver for a follow-up visit as soon as possible. Initially, a hairline fracture (break in bone) may not be evident on X-rays. Persistent pain and swelling indicate that further evaluation, non-weight bearing or use of the joint (use of crutches or slings as instructed), or further X-rays are indicated. X-rays may sometimes not show a small fracture until a week or 10 days later. Make a follow-up appointment with your own caregiver or one to whom we have referred you. A radiologist (specialist in reading X-rays) may read your X-rays. Make sure you know how you are to obtain your X-ray results. Do not assume everything is normal if you do not hear from us. °SEEK MEDICAL CARE IF: °Bruising, swelling, or pain increases. °SEEK IMMEDIATE MEDICAL CARE IF:  °· Your fingers or toes are numb or blue. °· The pain is not responding to medications and continues to stay the same or get worse. °· The pain in your joint becomes severe. °· You develop a fever over 102° F (38.9° C). °· It becomes impossible to move or use the joint. °MAKE SURE YOU:  °·   Understand these instructions.  Will watch your condition.  Will get help right away if you are not doing well or get worse. Document Released: 04/20/2005 Document Revised: 07/13/2011 Document Reviewed: 12/07/2007 [facility] Patient Information 2015 [place], [place]. This information is not intended to replace advice given to you by your health care provider. Make sure you discuss any questions you have with your health care  provider. Knee Pain The knee is the complex joint between your thigh and your lower leg. It is made up of bones, tendons, ligaments, and cartilage. The bones that make up the knee are:  The femur in the thigh.  The tibia and fibula in the lower leg.  The patella or kneecap riding in the groove on the lower femur. CAUSES  Knee pain is a common complaint with many causes. A few of these causes are:  Injury, such as:  A ruptured ligament or tendon injury.  Torn cartilage.  Medical conditions, such as:  Gout  Arthritis  Infections  Overuse, over training, or overdoing a physical activity. Knee pain can be minor or severe. Knee pain can accompany debilitating injury. Minor knee problems often respond well to self-care measures or get well on their own. More serious injuries may need medical intervention or even surgery. SYMPTOMS The knee is complex. Symptoms of knee problems can vary widely. Some of the problems are:  Pain with movement and weight bearing.  Swelling and tenderness.  Buckling of the knee.  Inability to straighten or extend your knee.  Your knee locks and you cannot straighten it.  Warmth and redness with pain and fever.  Deformity or dislocation of the kneecap. DIAGNOSIS  Determining what is wrong may be very straight forward such as when there is an injury. It can also be challenging because of the complexity of the knee. Tests to make a diagnosis may include:  Your caregiver taking a history and doing a physical exam.  Routine X-rays can be used to rule out other problems. X-rays will not reveal a cartilage tear. Some injuries of the knee can be diagnosed by:  Arthroscopy a surgical technique by which a small video camera is inserted through tiny incisions on the sides of the knee. This procedure is used to examine and repair internal knee joint problems. Tiny instruments can be used during arthroscopy to repair the torn knee cartilage  (meniscus).  Arthrography is a radiology technique. A contrast liquid is directly injected into the knee joint. Internal structures of the knee joint then become visible on X-ray film.  An MRI scan is a non X-ray radiology procedure in which magnetic fields and a computer produce two- or three-dimensional images of the inside of the knee. Cartilage tears are often visible using an MRI scanner. MRI scans have largely replaced arthrography in diagnosing cartilage tears of the knee.  Blood work.  Examination of the fluid that helps to lubricate the knee joint (synovial fluid). This is done by taking a sample out using a needle and a syringe. TREATMENT The treatment of knee problems depends on the cause. Some of these treatments are:  Depending on the injury, proper casting, splinting, surgery, or physical therapy care will be needed.  Give yourself adequate recovery time. Do not overuse your joints. If you begin to get sore during workout routines, back off. Slow down or do fewer repetitions.  For repetitive activities such as cycling or running, maintain your strength and nutrition.  Alternate muscle groups. For example, if you are a weight  lifter, work the upper body on one day and the lower body the next.  Either tight or weak muscles do not give the proper support for your knee. Tight or weak muscles do not absorb the stress placed on the knee joint. Keep the muscles surrounding the knee strong.  Take care of mechanical problems.  If you have flat feet, orthotics or special shoes may help. See your caregiver if you need help.  Arch supports, sometimes with wedges on the inner or outer aspect of the heel, can help. These can shift pressure away from the side of the knee most bothered by osteoarthritis.  A brace called an "unloader" brace also may be used to help ease the pressure on the most arthritic side of the knee.  If your caregiver has prescribed crutches, braces, wraps or ice,  use as directed. The acronym for this is PRICE. This means protection, rest, ice, compression, and elevation.  Nonsteroidal anti-inflammatory drugs (NSAIDs), can help relieve pain. But if taken immediately after an injury, they may actually increase swelling. Take NSAIDs with food in your stomach. Stop them if you develop stomach problems. Do not take these if you have a history of ulcers, stomach pain, or bleeding from the bowel. Do not take without your caregiver's approval if you have problems with fluid retention, heart failure, or kidney problems.  For ongoing knee problems, physical therapy may be helpful.  Glucosamine and chondroitin are over-the-counter dietary supplements. Both may help relieve the pain of osteoarthritis in the knee. These medicines are different from the usual anti-inflammatory drugs. Glucosamine may decrease the rate of cartilage destruction.  Injections of a corticosteroid drug into your knee joint may help reduce the symptoms of an arthritis flare-up. They may provide pain relief that lasts a few months. You may have to wait a few months between injections. The injections do have a small increased risk of infection, water retention, and elevated blood sugar levels.  Hyaluronic acid injected into damaged joints may ease pain and provide lubrication. These injections may work by reducing inflammation. A series of shots may give relief for as long as 6 months.  Topical painkillers. Applying certain ointments to your skin may help relieve the pain and stiffness of osteoarthritis. Ask your pharmacist for suggestions. Many over the-counter products are approved for temporary relief of arthritis pain.  In some countries, doctors often prescribe topical NSAIDs for relief of chronic conditions such as arthritis and tendinitis. A review of treatment with NSAID creams found that they worked as well as oral medications but without the serious side effects. PREVENTION  Maintain a  healthy weight. Extra pounds put more strain on your joints.  Get strong, stay limber. Weak muscles are a common cause of knee injuries. Stretching is important. Include flexibility exercises in your workouts.  Be smart about exercise. If you have osteoarthritis, chronic knee pain or recurring injuries, you may need to change the way you exercise. This does not mean you have to stop being active. If your knees ache after jogging or playing basketball, consider switching to swimming, water aerobics, or other low-impact activities, at least for a few days a week. Sometimes limiting high-impact activities will provide relief.  Make sure your shoes fit well. Choose footwear that is right for your sport.  Protect your knees. Use the proper gear for knee-sensitive activities. Use kneepads when playing volleyball or laying carpet. Buckle your seat belt every time you drive. Most shattered kneecaps occur in car accidents.  Rest  when you are tired. SEEK MEDICAL CARE IF:  You have knee pain that is continual and does not seem to be getting better.  SEEK IMMEDIATE MEDICAL CARE IF:  Your knee joint feels hot to the touch and you have a high fever. MAKE SURE YOU:   Understand these instructions.  Will watch your condition.  Will get help right away if you are not doing well or get worse. Document Released: 02/15/2007 Document Revised: 07/13/2011 Document Reviewed: 02/15/2007 [facility] Patient Information 2015 [place], [place]. This information is not intended to replace advice given to you by your health care provider. Make sure you discuss any questions you have with your health care provider.

## 2014-10-28 LAB — URINE CULTURE

## 2014-10-28 NOTE — ED Notes (Signed)
PT. Reports her daughter will drive her home.

## 2014-10-31 LAB — SURESWAB BACTERIAL VAGINOSIS/ITIS
Atopobium vaginae: NOT DETECTED Log (cells/mL)
C. PARAPSILOSIS, DNA: NOT DETECTED
C. TROPICALIS, DNA: NOT DETECTED
C. albicans, DNA: NOT DETECTED
C. glabrata, DNA: NOT DETECTED
Gardnerella vaginalis: NOT DETECTED Log (cells/mL)
LACTOBACILLUS SPECIES: 7.3 Log (cells/mL)
MEGASPHAERA SPECIES: NOT DETECTED Log (cells/mL)
T. vaginalis RNA, QL TMA: NOT DETECTED

## 2014-11-07 ENCOUNTER — Other Ambulatory Visit (HOSPITAL_COMMUNITY): Payer: Self-pay | Admitting: Orthopaedic Surgery

## 2014-11-08 ENCOUNTER — Ambulatory Visit (HOSPITAL_COMMUNITY)
Admission: RE | Admit: 2014-11-08 | Discharge: 2014-11-08 | Disposition: A | Discharge status: Home / Self Care | Payer: Medicare Other | Source: Ambulatory Visit | Attending: Obstetrics | Admitting: Obstetrics

## 2014-11-08 DIAGNOSIS — R102 Pelvic and perineal pain: Secondary | ICD-10-CM | POA: Diagnosis not present

## 2014-11-08 DIAGNOSIS — Z1231 Encounter for screening mammogram for malignant neoplasm of breast: Secondary | ICD-10-CM | POA: Insufficient documentation

## 2014-11-08 DIAGNOSIS — Z1239 Encounter for other screening for malignant neoplasm of breast: Secondary | ICD-10-CM

## 2014-11-12 ENCOUNTER — Ambulatory Visit: Payer: Medicaid Other | Admitting: Obstetrics

## 2014-11-15 NOTE — Patient Instructions (Addendum)
Brenda Odonnell  11/15/2014   Your procedure is scheduled on:  November 23, 2014  Report to [facility] Main  Entrance take [place]  elevators to 3rd floor to  [place] at 12:04 PM.  Call this number if you have problems the morning of surgery [phone]   Remember: ONLY 1 PERSON MAY GO WITH YOU TO SHORT STAY TO GET  READY MORNING OF [place].  Do not eat food after midnight Thursday night.  Follow clear liquid diet after midnight Thursday night until 8:00 am morning of surgery.  Then nothing by mouth.               Bring mask and tubing for C-pap machine   Take these medicines the morning of surgery with A SIP OF WATER:  Combigan eye drops, Dorzolamide (trusopt) eye drops, Flonase, Lamictal, Prilosec, Topamax                               You may not have any metal on your body including hair pins and              piercings  Do not wear jewelry, make-up, lotions, powders or perfumes, deodorant             Do not wear nail polish.  Do not shave  48 hours prior to surgery.            .   Do not bring valuables to the hospital. [place].  Contacts, dentures or bridgework may not be worn into surgery.  Leave suitcase in the car. After surgery it may be brought to your room.     :  Special Instructions: coughing and deep breathing exercises, leg exercises              Please read over the following fact sheets you were given: _____________________________________________________________________             [facility] - Preparing for Surgery Before surgery, you can play an important role.  Because skin is not sterile, your skin needs to be as free of germs as possible.  You can reduce the number of germs on your skin by washing with CHG (chlorahexidine gluconate) soap before surgery.  CHG is an antiseptic cleaner which kills germs and bonds with the skin to continue killing germs even after  washing. Please DO NOT use if you have an allergy to CHG or antibacterial soaps.  If your skin becomes reddened/irritated stop using the CHG and inform your nurse when you arrive at Short Stay. Do not shave (including legs and underarms) for at least 48 hours prior to the first CHG shower.  You may shave your face/neck. Please follow these instructions carefully:  1.  Shower with CHG Soap the night before surgery and the  morning of Surgery.  2.  If you choose to wash your hair, wash your hair first as usual with your  normal  shampoo.  3.  After you shampoo, rinse your hair and body thoroughly to remove the  shampoo.                           4.  Use CHG as you would  any other liquid soap.  You can apply chg directly  to the skin and wash                       Gently with a scrungie or clean washcloth.  5.  Apply the CHG Soap to your body ONLY FROM THE NECK DOWN.   Do not use on face/ open                           Wound or open sores. Avoid contact with eyes, ears mouth and genitals (private parts).                       Wash face,  Genitals (private parts) with your normal soap.             6.  Wash thoroughly, paying special attention to the area where your surgery  will be performed.  7.  Thoroughly rinse your body with warm water from the neck down.  8.  DO NOT shower/wash with your normal soap after using and rinsing off  the CHG Soap.                9.  Pat yourself dry with a clean towel.            10.  Wear clean pajamas.            11.  Place clean sheets on your bed the night of your first shower and do not  sleep with pets. Day of Surgery : Do not apply any lotions/deodorants the morning of surgery.  Please wear clean clothes to the hospital/surgery center.  FAILURE TO FOLLOW THESE INSTRUCTIONS MAY RESULT IN THE CANCELLATION OF YOUR SURGERY PATIENT SIGNATURE_________________________________  NURSE  SIGNATURE__________________________________  ________________________________________________________________________    CLEAR LIQUID DIET   Foods Allowed                                                                     Foods Excluded  Coffee and tea, regular and decaf                             liquids that you cannot  Plain Jell-O in any flavor                                             see through such as: Fruit ices (not with fruit pulp)                                     milk, soups, orange juice  Iced Popsicles                                    All solid food Carbonated beverages, regular and diet  Cranberry, grape and apple juices Sports drinks like Gatorade Lightly seasoned clear broth or consume(fat free) Sugar, honey syrup  Sample Menu Breakfast                                Lunch                                     Supper Cranberry juice                    Beef broth                            Chicken broth Jell-O                                     Grape juice                           Apple juice Coffee or tea                        Jell-O                                      Popsicle                                                Coffee or tea                        Coffee or tea  _____________________________________________________________________

## 2014-11-16 ENCOUNTER — Encounter (HOSPITAL_COMMUNITY): Payer: Self-pay

## 2014-11-16 ENCOUNTER — Encounter (HOSPITAL_COMMUNITY)
Admission: RE | Admit: 2014-11-16 | Discharge: 2014-11-16 | Disposition: A | Discharge status: Home / Self Care | Payer: Medicare Other | Source: Ambulatory Visit | Attending: Orthopaedic Surgery | Admitting: Orthopaedic Surgery

## 2014-11-16 DIAGNOSIS — M1712 Unilateral primary osteoarthritis, left knee: Secondary | ICD-10-CM | POA: Insufficient documentation

## 2014-11-16 DIAGNOSIS — Z01812 Encounter for preprocedural laboratory examination: Secondary | ICD-10-CM | POA: Insufficient documentation

## 2014-11-16 HISTORY — DX: Gastro-esophageal reflux disease without esophagitis: K21.9

## 2014-11-16 LAB — CBC
HCT: 36.6 % (ref 36.0–46.0)
Hemoglobin: 11.8 g/dL — ABNORMAL LOW (ref 12.0–15.0)
MCH: 30.2 pg (ref 26.0–34.0)
MCHC: 32.2 g/dL (ref 30.0–36.0)
MCV: 93.6 fL (ref 78.0–100.0)
Platelets: 256 10*3/uL (ref 150–400)
RBC: 3.91 MIL/uL (ref 3.87–5.11)
RDW: 14.1 % (ref 11.5–15.5)
WBC: 3.7 10*3/uL — ABNORMAL LOW (ref 4.0–10.5)

## 2014-11-16 LAB — SURGICAL PCR SCREEN
MRSA, PCR: NEGATIVE
STAPHYLOCOCCUS AUREUS: NEGATIVE

## 2014-11-16 LAB — BASIC METABOLIC PANEL
ANION GAP: 4 — AB (ref 5–15)
BUN: 16 mg/dL (ref 6–20)
CO2: 26 mmol/L (ref 22–32)
CREATININE: 1.19 mg/dL — AB (ref 0.44–1.00)
Calcium: 9.1 mg/dL (ref 8.9–10.3)
Chloride: 108 mmol/L (ref 101–111)
GFR calc Af Amer: >60 mL/min (ref >60)
GFR, EST NON AFRICAN AMERICAN: 53 mL/min — AB (ref >60)
Glucose, Bld: 95 mg/dL (ref 65–99)
POTASSIUM: 4.1 mmol/L (ref 3.5–5.1)
Sodium: 138 mmol/L (ref 135–145)

## 2014-11-16 LAB — APTT: aPTT: 31 seconds (ref 24–37)

## 2014-11-16 LAB — PROTIME-INR
INR: 1.02 (ref 0.00–1.49)
Prothrombin Time: 13.6 seconds (ref 11.6–15.2)

## 2014-11-16 NOTE — Progress Notes (Addendum)
09-07-14 - L[facility]- Dr. Jannifer Franklin (neuro) - [facility] 10-22-14 - MRI Brain - [facility]  09-10-14 - MRI Lumbar Spine Wo contrast - [facility] - Care Everywhere  05-19-12 - ECHO, DOPPLER Carotid, 2V CXR - [facility]

## 2014-11-16 NOTE — Progress Notes (Signed)
11-16-14 - Faxed preop lab results from 11-16-14 (BMP) to Dr. Ninfa Linden via [facility]

## 2014-11-21 ENCOUNTER — Inpatient Hospital Stay: Admission: RE | Admit: 2014-11-21 | Payer: Self-pay | Source: Ambulatory Visit

## 2014-11-22 MED ORDER — DEXTROSE 5 % IV SOLN
3.0000 g | INTRAVENOUS | Status: AC
  Administered 2014-11-23: 2 g via INTRAVENOUS
  Filled 2014-11-22: qty 3000

## 2014-11-22 NOTE — Anesthesia Preprocedure Evaluation (Addendum)
Anesthesia Evaluation  Patient identified by MRN, date of birth, ID band Patient awake    Reviewed: Allergy & Precautions, NPO status , Patient's Chart, lab work & pertinent test results  History of Anesthesia Complications Negative for: history of anesthetic complications  Airway Mallampati: III  TM Distance: >3 FB Neck ROM: Full    Dental no notable dental hx. (+) Dental Advisory Given   Pulmonary sleep apnea and Continuous Positive Airway Pressure Ventilation ,  breath sounds clear to auscultation  Pulmonary exam normal       Cardiovascular hypertension, Pt. on medications Normal cardiovascular examRhythm:Regular Rate:Normal     Neuro/Psych  Headaches, PSYCHIATRIC DISORDERS Anxiety Depression Bipolar Disorder Seen by a neurologist, see latest note : "The patient is referred to this office with considerations for possible multiple sclerosis. I think this is unlikely, but the patient is reporting a multitude of somatic complaints. The patient reports memory issues, with left-sided numbness. The patient has cervical spinal stenosis, this has not been surgically treated"    GI/Hepatic Neg liver ROS, GERD-  Medicated and Controlled,  Endo/Other  Morbid obesity  Renal/GU negative Renal ROS  negative genitourinary   Musculoskeletal  (+) Arthritis -, Osteoarthritis,    Abdominal (+) + obese,   Peds negative pediatric ROS (+)  Hematology negative hematology ROS (+)   Anesthesia Other Findings   Reproductive/Obstetrics negative OB ROS                            Anesthesia Physical Anesthesia Plan  ASA: III  Anesthesia Plan: General   Post-op Pain Management:    Induction: Intravenous  Airway Management Planned: Oral ETT  Additional Equipment:   Intra-op Plan:   Post-operative Plan: Extubation in OR  Informed Consent: I have reviewed the patients History and Physical, chart, labs and  discussed the procedure including the risks, benefits and alternatives for the proposed anesthesia with the patient or authorized representative who has indicated his/her understanding and acceptance.   Dental advisory given  Plan Discussed with: CRNA  Anesthesia Plan Comments:         Anesthesia Quick Evaluation

## 2014-11-23 ENCOUNTER — Inpatient Hospital Stay (HOSPITAL_COMMUNITY): Payer: Medicare Other | Admitting: Anesthesiology

## 2014-11-23 ENCOUNTER — Inpatient Hospital Stay (HOSPITAL_COMMUNITY)
Admission: RE | Admit: 2014-11-23 | Discharge: 2014-11-26 | DRG: 470 | Disposition: A | Discharge status: Skilled Nursing Facility | Payer: Medicare Other | Source: Ambulatory Visit | Attending: Orthopaedic Surgery | Admitting: Orthopaedic Surgery

## 2014-11-23 ENCOUNTER — Inpatient Hospital Stay (HOSPITAL_COMMUNITY): Payer: Medicare Other

## 2014-11-23 ENCOUNTER — Encounter (HOSPITAL_COMMUNITY): Payer: Self-pay | Admitting: *Deleted

## 2014-11-23 ENCOUNTER — Encounter (HOSPITAL_COMMUNITY)
Admission: RE | Disposition: A | Discharge status: Skilled Nursing Facility | Payer: Self-pay | Source: Ambulatory Visit | Attending: Orthopaedic Surgery

## 2014-11-23 DIAGNOSIS — M4802 Spinal stenosis, cervical region: Secondary | ICD-10-CM | POA: Diagnosis present

## 2014-11-23 DIAGNOSIS — Z01812 Encounter for preprocedural laboratory examination: Secondary | ICD-10-CM | POA: Diagnosis not present

## 2014-11-23 DIAGNOSIS — D62 Acute posthemorrhagic anemia: Secondary | ICD-10-CM | POA: Diagnosis not present

## 2014-11-23 DIAGNOSIS — K219 Gastro-esophageal reflux disease without esophagitis: Secondary | ICD-10-CM | POA: Diagnosis present

## 2014-11-23 DIAGNOSIS — Z6841 Body Mass Index (BMI) 40.0 and over, adult: Secondary | ICD-10-CM | POA: Diagnosis not present

## 2014-11-23 DIAGNOSIS — M1712 Unilateral primary osteoarthritis, left knee: Secondary | ICD-10-CM | POA: Diagnosis present

## 2014-11-23 DIAGNOSIS — F3181 Bipolar II disorder: Secondary | ICD-10-CM | POA: Diagnosis present

## 2014-11-23 DIAGNOSIS — Z79899 Other long term (current) drug therapy: Secondary | ICD-10-CM

## 2014-11-23 DIAGNOSIS — Z96652 Presence of left artificial knee joint: Secondary | ICD-10-CM

## 2014-11-23 DIAGNOSIS — Z8249 Family history of ischemic heart disease and other diseases of the circulatory system: Secondary | ICD-10-CM | POA: Diagnosis not present

## 2014-11-23 DIAGNOSIS — G4730 Sleep apnea, unspecified: Secondary | ICD-10-CM | POA: Diagnosis present

## 2014-11-23 DIAGNOSIS — I10 Essential (primary) hypertension: Secondary | ICD-10-CM | POA: Diagnosis present

## 2014-11-23 DIAGNOSIS — G35 Multiple sclerosis: Secondary | ICD-10-CM | POA: Diagnosis present

## 2014-11-23 DIAGNOSIS — M25562 Pain in left knee: Secondary | ICD-10-CM | POA: Diagnosis present

## 2014-11-23 HISTORY — PX: TOTAL KNEE ARTHROPLASTY: SHX125

## 2014-11-23 LAB — ABO/RH: ABO/RH(D): O POS

## 2014-11-23 LAB — TYPE AND SCREEN
ABO/RH(D): O POS
ANTIBODY SCREEN: NEGATIVE

## 2014-11-23 SURGERY — ARTHROPLASTY, KNEE, TOTAL
Anesthesia: General | Site: Knee | Laterality: Left

## 2014-11-23 MED ORDER — METOCLOPRAMIDE HCL 5 MG/ML IJ SOLN: 5.0000 mg | Freq: Three times a day (TID) | INTRAMUSCULAR | Status: DC | PRN

## 2014-11-23 MED ORDER — LACTATED RINGERS IV SOLN
INTRAVENOUS | Status: DC
  Administered 2014-11-23: 1000 mL via INTRAVENOUS

## 2014-11-23 MED ORDER — PANTOPRAZOLE SODIUM 40 MG PO TBEC
80.0000 mg | DELAYED_RELEASE_TABLET | Freq: Every day | ORAL | Status: DC
  Administered 2014-11-24 – 2014-11-26 (×3): 80 mg via ORAL
  Filled 2014-11-23 (×3): qty 2

## 2014-11-23 MED ORDER — ONDANSETRON HCL 4 MG/2ML IJ SOLN
INTRAMUSCULAR | Status: DC | PRN
  Administered 2014-11-23: 4 mg via INTRAVENOUS

## 2014-11-23 MED ORDER — MIDAZOLAM HCL 5 MG/5ML IJ SOLN
INTRAMUSCULAR | Status: DC | PRN
  Administered 2014-11-23: 2 mg via INTRAVENOUS

## 2014-11-23 MED ORDER — ALUM & MAG HYDROXIDE-SIMETH 200-200-20 MG/5ML PO SUSP: 30.0000 mL | ORAL | Status: DC | PRN

## 2014-11-23 MED ORDER — ACETAMINOPHEN 10 MG/ML IV SOLN
INTRAVENOUS | Status: AC
  Filled 2014-11-23: qty 100

## 2014-11-23 MED ORDER — PROPOFOL 10 MG/ML IV BOLUS
INTRAVENOUS | Status: AC
  Filled 2014-11-23: qty 20

## 2014-11-23 MED ORDER — SODIUM CHLORIDE 0.9 % IV SOLN
INTRAVENOUS | Status: DC
  Administered 2014-11-23: 21:00:00 via INTRAVENOUS

## 2014-11-23 MED ORDER — PROPOFOL 10 MG/ML IV BOLUS
INTRAVENOUS | Status: DC | PRN
Start: 2014-11-23 — End: 2014-11-23
  Administered 2014-11-23: 200 mg via INTRAVENOUS

## 2014-11-23 MED ORDER — HYDROMORPHONE HCL 1 MG/ML IJ SOLN
0.2500 mg | INTRAMUSCULAR | Status: DC | PRN
  Administered 2014-11-23 (×8): 0.25 mg via INTRAVENOUS

## 2014-11-23 MED ORDER — LIDOCAINE HCL (CARDIAC) 20 MG/ML IV SOLN
INTRAVENOUS | Status: AC
  Filled 2014-11-23: qty 5

## 2014-11-23 MED ORDER — FENTANYL CITRATE (PF) 250 MCG/5ML IJ SOLN
INTRAMUSCULAR | Status: AC
  Filled 2014-11-23: qty 5

## 2014-11-23 MED ORDER — TRANEXAMIC ACID 1000 MG/10ML IV SOLN
1000.0000 mg | INTRAVENOUS | Status: AC
  Administered 2014-11-23: 1000 mg via INTRAVENOUS
  Filled 2014-11-23: qty 10

## 2014-11-23 MED ORDER — FLUTICASONE PROPIONATE 50 MCG/ACT NA SUSP: 1.0000 | Freq: Every day | NASAL | Status: DC | PRN

## 2014-11-23 MED ORDER — HYDROMORPHONE HCL 1 MG/ML IJ SOLN
INTRAMUSCULAR | Status: AC
  Administered 2014-11-24: 2 mg via INTRAVENOUS
  Filled 2014-11-23: qty 1

## 2014-11-23 MED ORDER — MIDAZOLAM HCL 2 MG/2ML IJ SOLN
INTRAMUSCULAR | Status: AC
  Filled 2014-11-23: qty 2

## 2014-11-23 MED ORDER — DORZOLAMIDE HCL 2 % OP SOLN
1.0000 [drp] | Freq: Two times a day (BID) | OPHTHALMIC | Status: DC
  Administered 2014-11-24 – 2014-11-25 (×4): 1 [drp] via OPHTHALMIC
  Filled 2014-11-23: qty 10

## 2014-11-23 MED ORDER — FLUOXETINE HCL 20 MG PO CAPS
20.0000 mg | ORAL_CAPSULE | Freq: Every day | ORAL | Status: DC
  Administered 2014-11-24 – 2014-11-26 (×3): 20 mg via ORAL
  Filled 2014-11-23 (×3): qty 1

## 2014-11-23 MED ORDER — ROCURONIUM BROMIDE 100 MG/10ML IV SOLN
INTRAVENOUS | Status: AC
  Filled 2014-11-23: qty 1

## 2014-11-23 MED ORDER — HYDROMORPHONE HCL 1 MG/ML IJ SOLN
INTRAMUSCULAR | Status: AC
  Filled 2014-11-23: qty 1

## 2014-11-23 MED ORDER — BRIMONIDINE TARTRATE 0.2 % OP SOLN
1.0000 [drp] | Freq: Two times a day (BID) | OPHTHALMIC | Status: DC
  Administered 2014-11-23 – 2014-11-25 (×5): 1 [drp] via OPHTHALMIC
  Filled 2014-11-23: qty 5

## 2014-11-23 MED ORDER — HYDROMORPHONE HCL 1 MG/ML IJ SOLN
1.0000 mg | INTRAMUSCULAR | Status: DC | PRN
  Administered 2014-11-23: 1 mg via INTRAVENOUS
  Administered 2014-11-23 – 2014-11-24 (×2): 2 mg via INTRAVENOUS
  Administered 2014-11-24: 1 mg via INTRAVENOUS
  Administered 2014-11-24: 2 mg via INTRAVENOUS
  Administered 2014-11-24 (×2): 1 mg via INTRAVENOUS
  Filled 2014-11-23 (×3): qty 2
  Filled 2014-11-23 (×4): qty 1

## 2014-11-23 MED ORDER — AMPHETAMINE-DEXTROAMPHET ER 20 MG PO CP24
30.0000 mg | ORAL_CAPSULE | Freq: Every day | ORAL | Status: DC
  Administered 2014-11-24 – 2014-11-26 (×3): 30 mg via ORAL
  Filled 2014-11-23 (×6): qty 1

## 2014-11-23 MED ORDER — ONDANSETRON HCL 4 MG/2ML IJ SOLN
INTRAMUSCULAR | Status: AC
  Filled 2014-11-23: qty 2

## 2014-11-23 MED ORDER — OXYCODONE HCL ER 20 MG PO T12A
20.0000 mg | EXTENDED_RELEASE_TABLET | Freq: Two times a day (BID) | ORAL | Status: DC
  Administered 2014-11-23 – 2014-11-26 (×6): 20 mg via ORAL
  Filled 2014-11-23 (×6): qty 1

## 2014-11-23 MED ORDER — LORAZEPAM 0.5 MG PO TABS
0.5000 mg | ORAL_TABLET | Freq: Three times a day (TID) | ORAL | Status: DC
  Administered 2014-11-23 – 2014-11-26 (×9): 0.5 mg via ORAL
  Filled 2014-11-23 (×9): qty 1

## 2014-11-23 MED ORDER — DIPHENHYDRAMINE HCL 12.5 MG/5ML PO ELIX: 12.5000 mg | ORAL_SOLUTION | ORAL | Status: DC | PRN

## 2014-11-23 MED ORDER — HYDROMORPHONE HCL 1 MG/ML IJ SOLN
0.2500 mg | INTRAMUSCULAR | Status: DC | PRN
  Administered 2014-11-23 (×2): 0.5 mg via INTRAVENOUS

## 2014-11-23 MED ORDER — LACTATED RINGERS IV SOLN
INTRAVENOUS | Status: DC | PRN
  Administered 2014-11-23 (×2): via INTRAVENOUS

## 2014-11-23 MED ORDER — ACETAMINOPHEN 325 MG PO TABS
650.0000 mg | ORAL_TABLET | Freq: Four times a day (QID) | ORAL | Status: DC | PRN
  Administered 2014-11-25: 650 mg via ORAL
  Filled 2014-11-23: qty 2

## 2014-11-23 MED ORDER — BRIMONIDINE TARTRATE-TIMOLOL 0.2-0.5 % OP SOLN: 1.0000 [drp] | Freq: Two times a day (BID) | OPHTHALMIC | Status: DC

## 2014-11-23 MED ORDER — DOCUSATE SODIUM 100 MG PO CAPS
100.0000 mg | ORAL_CAPSULE | Freq: Two times a day (BID) | ORAL | Status: DC
  Administered 2014-11-23 – 2014-11-26 (×6): 100 mg via ORAL

## 2014-11-23 MED ORDER — GABAPENTIN 300 MG PO CAPS
600.0000 mg | ORAL_CAPSULE | Freq: Three times a day (TID) | ORAL | Status: DC
  Administered 2014-11-23 – 2014-11-26 (×9): 600 mg via ORAL
  Filled 2014-11-23 (×10): qty 2

## 2014-11-23 MED ORDER — RIVAROXABAN 10 MG PO TABS
10.0000 mg | ORAL_TABLET | Freq: Every day | ORAL | Status: DC
  Administered 2014-11-24 – 2014-11-26 (×3): 10 mg via ORAL
  Filled 2014-11-23 (×4): qty 1

## 2014-11-23 MED ORDER — TOPIRAMATE 100 MG PO TABS
100.0000 mg | ORAL_TABLET | Freq: Every day | ORAL | Status: DC
  Administered 2014-11-24 – 2014-11-26 (×3): 100 mg via ORAL
  Filled 2014-11-23 (×3): qty 1

## 2014-11-23 MED ORDER — SODIUM CHLORIDE 0.9 % IR SOLN
Status: DC | PRN
  Administered 2014-11-23: 2000 mL

## 2014-11-23 MED ORDER — METHOCARBAMOL 1000 MG/10ML IJ SOLN
500.0000 mg | Freq: Four times a day (QID) | INTRAVENOUS | Status: DC | PRN
  Administered 2014-11-23 – 2014-11-25 (×3): 500 mg via INTRAVENOUS
  Filled 2014-11-23 (×6): qty 5

## 2014-11-23 MED ORDER — ACETAMINOPHEN 650 MG RE SUPP: 650.0000 mg | Freq: Four times a day (QID) | RECTAL | Status: DC | PRN

## 2014-11-23 MED ORDER — TIMOLOL MALEATE 0.5 % OP SOLN
1.0000 [drp] | Freq: Two times a day (BID) | OPHTHALMIC | Status: DC
  Administered 2014-11-23 – 2014-11-25 (×4): 1 [drp] via OPHTHALMIC
  Filled 2014-11-23: qty 5

## 2014-11-23 MED ORDER — ACETAMINOPHEN 10 MG/ML IV SOLN
1000.0000 mg | Freq: Once | INTRAVENOUS | Status: AC
  Administered 2014-11-23: 1000 mg via INTRAVENOUS

## 2014-11-23 MED ORDER — METHOCARBAMOL 500 MG PO TABS
500.0000 mg | ORAL_TABLET | Freq: Four times a day (QID) | ORAL | Status: DC | PRN
  Administered 2014-11-24 – 2014-11-26 (×4): 500 mg via ORAL
  Filled 2014-11-23 (×4): qty 1

## 2014-11-23 MED ORDER — CEFAZOLIN SODIUM-DEXTROSE 2-3 GM-% IV SOLR
2.0000 g | Freq: Four times a day (QID) | INTRAVENOUS | Status: AC
  Administered 2014-11-23 – 2014-11-24 (×2): 2 g via INTRAVENOUS
  Filled 2014-11-23 (×2): qty 50

## 2014-11-23 MED ORDER — ZOLPIDEM TARTRATE 5 MG PO TABS
5.0000 mg | ORAL_TABLET | Freq: Every evening | ORAL | Status: DC | PRN
  Administered 2014-11-24 – 2014-11-25 (×3): 5 mg via ORAL
  Filled 2014-11-23 (×3): qty 1

## 2014-11-23 MED ORDER — MENTHOL 3 MG MT LOZG
1.0000 | LOZENGE | OROMUCOSAL | Status: DC | PRN
  Administered 2014-11-24: 3 mg via ORAL
  Filled 2014-11-23: qty 9

## 2014-11-23 MED ORDER — HYDROMORPHONE HCL 1 MG/ML IJ SOLN
INTRAMUSCULAR | Status: AC
  Administered 2014-11-24: 1 mg via INTRAVENOUS
  Filled 2014-11-23: qty 1

## 2014-11-23 MED ORDER — OXYCODONE HCL 5 MG PO TABS
5.0000 mg | ORAL_TABLET | ORAL | Status: DC | PRN
  Administered 2014-11-23 – 2014-11-26 (×11): 15 mg via ORAL
  Filled 2014-11-23 (×11): qty 3

## 2014-11-23 MED ORDER — PHENOL 1.4 % MT LIQD: 1.0000 | OROMUCOSAL | Status: DC | PRN

## 2014-11-23 MED ORDER — ONDANSETRON HCL 4 MG/2ML IJ SOLN: 4.0000 mg | Freq: Four times a day (QID) | INTRAMUSCULAR | Status: DC | PRN

## 2014-11-23 MED ORDER — SUCCINYLCHOLINE CHLORIDE 20 MG/ML IJ SOLN
INTRAMUSCULAR | Status: DC | PRN
  Administered 2014-11-23: 140 mg via INTRAVENOUS

## 2014-11-23 MED ORDER — METOCLOPRAMIDE HCL 10 MG PO TABS: 5.0000 mg | ORAL_TABLET | Freq: Three times a day (TID) | ORAL | Status: DC | PRN

## 2014-11-23 MED ORDER — GLYCOPYRROLATE 0.2 MG/ML IJ SOLN
INTRAMUSCULAR | Status: AC
  Filled 2014-11-23: qty 3

## 2014-11-23 MED ORDER — LAMOTRIGINE 200 MG PO TABS
200.0000 mg | ORAL_TABLET | Freq: Every day | ORAL | Status: DC
  Administered 2014-11-24 – 2014-11-26 (×3): 200 mg via ORAL
  Filled 2014-11-23 (×3): qty 1

## 2014-11-23 MED ORDER — NEOSTIGMINE METHYLSULFATE 10 MG/10ML IV SOLN
INTRAVENOUS | Status: AC
  Filled 2014-11-23: qty 1

## 2014-11-23 MED ORDER — ONDANSETRON HCL 4 MG PO TABS: 4.0000 mg | ORAL_TABLET | Freq: Four times a day (QID) | ORAL | Status: DC | PRN

## 2014-11-23 MED ORDER — FENTANYL CITRATE (PF) 100 MCG/2ML IJ SOLN
INTRAMUSCULAR | Status: DC | PRN
  Administered 2014-11-23: 50 ug via INTRAVENOUS
  Administered 2014-11-23: 100 ug via INTRAVENOUS
  Administered 2014-11-23: 25 ug via INTRAVENOUS
  Administered 2014-11-23 (×3): 50 ug via INTRAVENOUS
  Administered 2014-11-23: 25 ug via INTRAVENOUS
  Administered 2014-11-23: 50 ug via INTRAVENOUS
  Administered 2014-11-23 (×2): 25 ug via INTRAVENOUS
  Administered 2014-11-23: 50 ug via INTRAVENOUS

## 2014-11-23 MED ORDER — LIDOCAINE HCL (CARDIAC) 20 MG/ML IV SOLN
INTRAVENOUS | Status: DC | PRN
  Administered 2014-11-23: 100 mg via INTRAVENOUS

## 2014-11-23 MED ORDER — FUROSEMIDE 40 MG PO TABS
40.0000 mg | ORAL_TABLET | Freq: Every day | ORAL | Status: DC
  Administered 2014-11-24 – 2014-11-26 (×3): 40 mg via ORAL
  Filled 2014-11-23 (×3): qty 1

## 2014-11-23 SURGICAL SUPPLY — 60 items
BAG ZIPLOCK 12X15 (MISCELLANEOUS) ×2 IMPLANT
BANDAGE ELASTIC 6 VELCRO ST LF (GAUZE/BANDAGES/DRESSINGS) ×2 IMPLANT
BANDAGE ESMARK 6X9 LF (GAUZE/BANDAGES/DRESSINGS) ×1 IMPLANT
BENZOIN TINCTURE PRP APPL 2/3 (GAUZE/BANDAGES/DRESSINGS) IMPLANT
BLADE SAG 13.0X1.37X90 (BLADE) ×2 IMPLANT
BLADE SAG 18X100X1.27 (BLADE) IMPLANT
BNDG ESMARK 6X9 LF (GAUZE/BANDAGES/DRESSINGS) ×2
BOWL SMART MIX CTS (DISPOSABLE) ×2 IMPLANT
CAPT KNEE TOTAL 3 ×2 IMPLANT
CEMENT BONE 1-PACK (Cement) ×4 IMPLANT
CUFF TOURN SGL QUICK 34 (TOURNIQUET CUFF)
CUFF TRNQT CYL 34X4X40X1 (TOURNIQUET CUFF) IMPLANT
DRAPE EXTREMITY T 121X128X90 (DRAPE) ×2 IMPLANT
DRAPE POUCH INSTRU U-SHP 10X18 (DRAPES) ×2 IMPLANT
DRAPE SHEET LG 3/4 BI-LAMINATE (DRAPES) ×2 IMPLANT
DRAPE U-SHAPE 47X51 STRL (DRAPES) ×2 IMPLANT
DRSG AQUACEL AG ADV 3.5X10 (GAUZE/BANDAGES/DRESSINGS) ×2 IMPLANT
DRSG PAD ABDOMINAL 8X10 ST (GAUZE/BANDAGES/DRESSINGS) ×2 IMPLANT
DURAPREP 26ML APPLICATOR (WOUND CARE) ×2 IMPLANT
ELECT REM PT RETURN 9FT ADLT (ELECTROSURGICAL) ×2
ELECTRODE REM PT RTRN 9FT ADLT (ELECTROSURGICAL) ×1 IMPLANT
FACESHIELD WRAPAROUND (MASK) ×8 IMPLANT
GAUZE SPONGE 4X4 12PLY STRL (GAUZE/BANDAGES/DRESSINGS) ×2 IMPLANT
GAUZE XEROFORM 1X8 LF (GAUZE/BANDAGES/DRESSINGS) ×2 IMPLANT
GLOVE BIO SURGEON STRL SZ7.5 (GLOVE) ×2 IMPLANT
GLOVE BIOGEL PI IND STRL 8 (GLOVE) ×2 IMPLANT
GLOVE BIOGEL PI INDICATOR 8 (GLOVE) ×2
GLOVE ECLIPSE 8.0 STRL XLNG CF (GLOVE) ×2 IMPLANT
GLOVE SURG SS PI 7.0 STRL IVOR (GLOVE) ×2 IMPLANT
GOWN STRL REUS W/TWL XL LVL3 (GOWN DISPOSABLE) ×4 IMPLANT
HANDPIECE INTERPULSE COAX TIP (DISPOSABLE) ×1
IMMOBILIZER KNEE 20 (SOFTGOODS) ×4 IMPLANT
IMMOBILIZER KNEE 20 THIGH 36 (SOFTGOODS) ×1 IMPLANT
KIT BASIN OR (CUSTOM PROCEDURE TRAY) ×2 IMPLANT
NS IRRIG 1000ML POUR BTL (IV SOLUTION) ×2 IMPLANT
PACK TOTAL JOINT (CUSTOM PROCEDURE TRAY) ×2 IMPLANT
PAD ABD 8X10 STRL (GAUZE/BANDAGES/DRESSINGS) ×2 IMPLANT
PADDING CAST ABS 6INX4YD NS (CAST SUPPLIES) ×1
PADDING CAST ABS COTTON 6X4 NS (CAST SUPPLIES) ×1 IMPLANT
PADDING CAST COTTON 6X4 STRL (CAST SUPPLIES) ×4 IMPLANT
PEN SKIN MARKING BROAD (MISCELLANEOUS) ×2 IMPLANT
POSITIONER SURGICAL ARM (MISCELLANEOUS) ×2 IMPLANT
SET HNDPC FAN SPRY TIP SCT (DISPOSABLE) ×1 IMPLANT
SET PAD KNEE POSITIONER (MISCELLANEOUS) ×2 IMPLANT
STAPLER VISISTAT 35W (STAPLE) ×2 IMPLANT
STRIP CLOSURE SKIN 1/2X4 (GAUZE/BANDAGES/DRESSINGS) IMPLANT
SUCTION FRAZIER 12FR DISP (SUCTIONS) ×2 IMPLANT
SUT MNCRL AB 4-0 PS2 18 (SUTURE) IMPLANT
SUT VIC AB 0 CT1 27 (SUTURE) ×1
SUT VIC AB 0 CT1 27XBRD ANTBC (SUTURE) ×1 IMPLANT
SUT VIC AB 1 CT1 27 (SUTURE) ×3
SUT VIC AB 1 CT1 27XBRD ANTBC (SUTURE) ×3 IMPLANT
SUT VIC AB 2-0 CT1 27 (SUTURE) ×2
SUT VIC AB 2-0 CT1 TAPERPNT 27 (SUTURE) ×2 IMPLANT
TOWEL OR 17X26 10 PK STRL BLUE (TOWEL DISPOSABLE) ×2 IMPLANT
TOWEL OR NON WOVEN STRL DISP B (DISPOSABLE) ×2 IMPLANT
TRAY FOLEY W/METER SILVER 14FR (SET/KITS/TRAYS/PACK) ×2 IMPLANT
WATER STERILE IRR 1500ML POUR (IV SOLUTION) ×2 IMPLANT
WRAP KNEE MAXI GEL POST OP (GAUZE/BANDAGES/DRESSINGS) ×2 IMPLANT
YANKAUER SUCT BULB TIP 10FT TU (MISCELLANEOUS) ×2 IMPLANT

## 2014-11-23 NOTE — Op Note (Signed)
Brenda Odonnell, Brenda [name]:  [id number]  MEDICAL RECORD NO.:  [id number]  LOCATION:  9798                         FACILITY:  [facility]  PHYSICIAN:  Lind Guest. Ninfa Linden, M.D.DATE OF BIRTH:  1965/10/22  DATE OF PROCEDURE:  11/23/2014 DATE OF DISCHARGE:                              OPERATIVE REPORT   PREOPERATIVE DIAGNOSIS:  Primary osteoarthritis and degenerative joint disease of left knee.  POSTOPERATIVE DIAGNOSIS:  Primary osteoarthritis and degenerative joint disease of left knee.  PROCEDURE:  Left total knee arthroplasty.  IMPLANTS:  Smith and Nephew Genesis II knee with a size 5 Oxinium femur, size 4 tibial tray, 11 mm constrained polyethylene insert, size 32 patella button.  SURGEON:  Jean Rosenthal, MD.  ASSISTING:  Erskine Emery, PA-C.  ANESTHESIA:  General.  ANTIBIOTICS:  3 g IV Ancef.  TOURNIQUET TIME:  Less than an hour and a half.  BLOOD LOSS:  Less than 200 mL.  COMPLICATIONS:  None.  INDICATIONS:  Brenda Odonnell is a 49 year old morbidly obese female with severe osteoarthritis involving her left knee.  I have known her for many years now and in 2011 performed arthroscopic intervention to try to temporize her knee.  We had provided steroid injections, supplement injections, IC rest, anti-inflammatories, physical therapy, and even some weight loss.  It has gotten to where her pain is daily, her mobility is severely limited, and her quality of life has been detrimentally affected.  She understands the risks of surgery given her 49 age and her morbid obesity, and she does wish to proceed.  She understands the risk of acute blood loss anemia, nerve and vessel injury, fracture infection, DVT, and again all of these were heightened due to her obesity.  She understands the goals are decreased pain, improved mobility, and overall improved quality of life.  PROCEDURE DESCRIPTION:  After informed consent was obtained, appropriate left  knee was marked.  She was brought to the operating room and placed supine on the operating table.  General anesthesia was then obtained.  A nonsterile tourniquet was placed around her upper left leg.  Her left leg was prepped and draped from the thigh down the toes with DuraPrep and sterile drapes including sterile stockinette.  A time-out was called to identify correct patient, correct left knee.  We then used an Esmarch to wrap out the leg, and tourniquet was inflated to 300 mmHg of pressure.  We took a midline incision and carried this proximally and distally and dissected down to the knee joint.  We performed a medial parapatellar arthrotomy and found a large joint effusion and significant osteophytes throughout the knee with a varus deformity and severe tricompartmental arthritis.  We removed remnants of ACL, PCL, medial and lateral meniscus as well as the osteophytes around the knee and released tissue medially due to her varus deformity.  We then used an extramedullary guide with the knee in a flexed position before making our tibial cut taking 9 mm off the high side, correcting her varus and valgus and negative slope.  We made this cut without difficulty.  We then went to the femur.  With the knee in the flexed position, using intramedullary guide and with a  drill hole to drill through the notch, we set our distal femoral cutting guide for 9.5 mm off a 5 degrees left knee.  We made this cut without difficulty and brought the knee back down the extension with a 9 mm extension block.  She actually even hyperextended a little bit.  So, we went back to the femur and put our femoral sizing guide.  Based off the epicondylar axis and Whitesides line, we then chose a size 5 femur.  We put the 4 in 1 cutting block for a size 5 femur and made our anterior and posterior cuts, followed by our chamfer cuts and finally our femoral box cut.  We then went back to the tibia and trialed for a size 4  tibia.  We set the rotation off the femur and the tibial tubercle and got good coverage.  We made our keel and lug punch for the tibia.  With the trial 4 tibia the trial 5 femur, we placed an 11 mm constrained fix bearing polyethylene insert due to her obesity, and I was pleased with her motion and stability.  We then made our patellar cut and drilled 3 holes for a small 32 patellar button.  We then removed all trial implants and irrigated the knee with normal saline solution using pulsatile lavage.  We mixed our cement and then cemented the real Smith and Nephew Genesis II size 4 tibia followed by the Oxinium size 5 femur.  We placed the real fix bearing, constrained 11 mm polyethylene insert and cemented the patellar button.  When cement had hardened, with the tourniquet down, hemostasis was obtained with electrocautery.  We removed cement remnants from the knee, and then irrigated the knee again with normal saline solution.  We then closed the arthrotomy with interrupted #1 Vicryl suture, followed by 0 Vicryl in the deep tissue, 2-0 Vicryl in the subcutaneous tissue, and staples on the skin.  Xeroform and well-padded sterile dressing was applied. She was then awakened, extubated, and taken to recovery room in stable condition.  All final counts were correct and no complications were noted.  Of note, Erskine Emery, PA-C assisted in the entire case, and his assistance was crucial for facilitating all aspects of this case.     Lind Guest. Ninfa Linden, M.D.     CYB/MEDQ  D:  11/23/2014  T:  11/23/2014  Job:  [id number]

## 2014-11-23 NOTE — Brief Op Note (Signed)
11/23/2014  5:09 PM  PATIENT:  Brenda Odonnell  49 y.o. female  PRE-OPERATIVE DIAGNOSIS:  severe primary osteoarthritis left knee  POST-OPERATIVE DIAGNOSIS:  severe primary osteoarthritis left knee  PROCEDURE:  Procedure(s): LEFT TOTAL KNEE ARTHROPLASTY (Left)  SURGEON:  Surgeon(s) and Role:    * Mcarthur Rossetti, MD - Primary  PHYSICIAN ASSISTANT: Benita Stabile, PA-C  ANESTHESIA:   general  EBL:  Total I/O In: 1500 [I.V.:1500] Out: 200 [Urine:150; Blood:50]  BLOOD ADMINISTERED:none  DRAINS: none   LOCAL MEDICATIONS USED:  NONE  SPECIMEN:  No Specimen  DISPOSITION OF SPECIMEN:  N/A  COUNTS:  YES  TOURNIQUET:   Total Tourniquet Time Documented: Thigh (Left) - 68 minutes Total: Thigh (Left) - 68 minutes   DICTATION: .Other Dictation: Dictation Number [id number]  PLAN OF CARE: Admit to inpatient   PATIENT DISPOSITION:  PACU - hemodynamically stable.   Delay start of Pharmacological VTE agent (>24hrs) due to surgical blood loss or risk of bleeding: no

## 2014-11-23 NOTE — Anesthesia Postprocedure Evaluation (Signed)
  Anesthesia Post-op Note  Patient: Brenda Odonnell  Procedure(s) Performed: Procedure(s) (LRB): LEFT TOTAL KNEE ARTHROPLASTY (Left)  Patient Location: PACU  Anesthesia Type: General  Level of Consciousness: awake and alert   Airway and Oxygen Therapy: Patient Spontanous Breathing  Post-op Pain: mild  Post-op Assessment: Post-op Vital signs reviewed, Patient's Cardiovascular Status Stable, Respiratory Function Stable, Patent Airway and No signs of Nausea or vomiting  Last Vitals:  Filed Vitals:   11/23/14 1745  BP: 172/42  Pulse: 86  Temp:   Resp: 24    Post-op Vital Signs: stable   Complications: No apparent anesthesia complications

## 2014-11-23 NOTE — Anesthesia Procedure Notes (Signed)
Procedure Name: Intubation Performed by: Gean Maidens Pre-anesthesia Checklist: Patient identified, Emergency Drugs available, Suction available, Patient being monitored and Timeout performed Patient Re-evaluated:Patient Re-evaluated prior to inductionOxygen Delivery Method: Circle system utilized Preoxygenation: Pre-oxygenation with 100% oxygen Intubation Type: IV induction Ventilation: Mask ventilation without difficulty and Oral airway inserted - appropriate to patient size Laryngoscope Size: Mac and 4 Grade View: Grade II Tube type: Oral Tube size: 7.5 mm Number of attempts: 1 Airway Equipment and Method: Stylet Placement Confirmation: ETT inserted through vocal cords under direct vision,  positive ETCO2,  CO2 detector and breath sounds checked- equal and bilateral Secured at: 21 cm Tube secured with: Tape Dental Injury: Teeth and Oropharynx as per pre-operative assessment

## 2014-11-23 NOTE — Transfer of Care (Signed)
Immediate Anesthesia Transfer of Care Note  Patient: Brenda Odonnell  Procedure(s) Performed: Procedure(s): LEFT TOTAL KNEE ARTHROPLASTY (Left)  Patient Location: PACU  Anesthesia Type:General  Level of Consciousness: sedated, patient cooperative and responds to stimulation  Airway & Oxygen Therapy: Patient Spontanous Breathing and Patient connected to face mask oxygen  Post-op Assessment: Report given to RN and Post -op Vital signs reviewed and stable  Post vital signs: Reviewed and stable  Last Vitals: There were no vitals filed for this visit.  Complications: No apparent anesthesia complications

## 2014-11-23 NOTE — H&P (Signed)
TOTAL KNEE ADMISSION H&P  Patient is being admitted for left total knee arthroplasty.  Subjective:  Chief Complaint:left knee pain.  HPI: Brenda Odonnell, 49 y.o. female, has a history of pain and functional disability in the left knee due to arthritis and has failed non-surgical conservative treatments for greater than 12 weeks to includeNSAID's and/or analgesics, corticosteriod injections, viscosupplementation injections, flexibility and strengthening excercises, supervised PT with diminished ADL's post treatment, weight reduction as appropriate and activity modification.  Onset of symptoms was gradual, starting 5 years ago with gradually worsening course since that time. The patient noted no past surgery on the left knee(s).  Patient currently rates pain in the left knee(s) at 10 out of 10 with activity. Patient has night pain, worsening of pain with activity and weight bearing, pain that interferes with activities of daily living, pain with passive range of motion, crepitus and joint swelling.  Patient has evidence of subchondral sclerosis, periarticular osteophytes and joint space narrowing by imaging studies. There is no active infection.  Patient Active Problem List   Diagnosis Date Noted  . Osteoarthritis of left knee 11/23/2014  . Dysphagia, pharyngoesophageal phase 09/07/2014  . Spinal stenosis in cervical region 09/07/2014  . BV (bacterial vaginosis) 04/03/2014  . Superficial fungus infection of skin 04/02/2014  . Candida vaginitis 04/02/2014  . Pelvic pain in female 02/13/2014  . Abnormality of gait 12/11/2013  . Urinary incontinence 12/11/2013  . Memory loss 12/11/2013  . Drowsiness 12/07/2012  . Dizziness and giddiness 12/07/2012  . Major depressive disorder, recurrent episode, severe degree, without mention of psychotic behavior 12/01/2012  . Overdose 12/01/2012  . Urinary urgency 10/12/2012  . Carbuncle 10/12/2012  . Headache 05/19/2012  . Spinal stenosis   . Glaucoma    . Carpal tunnel syndrome    Past Medical History  Diagnosis Date  . Sinus infection   . Hypertension   . Arthritis   . Spinal stenosis   . Glaucoma   . Carpal tunnel syndrome   . Depression   . Bipolar 2 disorder   . Obesity   . Arthritis   . Glaucoma   . Leiomyoma of uterus, unspecified   . Sleep apnea   . MS (multiple sclerosis)   . Migraine   . Spinal stenosis in cervical region 09/07/2014    C5-6 level  . GERD (gastroesophageal reflux disease)     Past Surgical History  Procedure Laterality Date  . Carpal tunnel release Bilateral   . Abdominal hysterectomy    . Knee surgery Left     arthroscopic  . Myelogram    . Cesarean section    . Breast reduction surgery      No prescriptions prior to admission   Allergies  Allergen Reactions  . Ibuprofen Nausea And Vomiting    Due to gird    History  Substance Use Topics  . Smoking status: Never Smoker   . Smokeless tobacco: Never Used  . Alcohol Use: Yes     Comment: socially, red wine    Family History  Problem Relation Age of Onset  . Hypertension    . Hypertension Father   . Hyperlipidemia Father   . Fibromyalgia Mother   . Diabetes Mother   . Healthy Sister   . Healthy Brother      Review of Systems  Musculoskeletal: Positive for back pain and joint pain.  All other systems reviewed and are negative.   Objective:  Physical Exam  Constitutional: She is oriented to person, place,  and time. She appears well-developed and well-nourished.  HENT:  Head: Normocephalic and atraumatic.  Eyes: EOM are normal. Pupils are equal, round, and reactive to light.  Neck: Normal range of motion. Neck supple.  Cardiovascular: Normal rate and regular rhythm.   Respiratory: Effort normal and breath sounds normal.  GI: Soft. Bowel sounds are normal.  Musculoskeletal:       Left knee: She exhibits decreased range of motion, swelling, effusion and abnormal alignment. Tenderness found. Medial joint line and lateral  joint line tenderness noted.  Neurological: She is alert and oriented to person, place, and time.  Skin: Skin is warm and dry.  Psychiatric: She has a normal mood and affect.    Vital signs in last 24 hours:    Labs:   Estimated body mass index is 46.67 kg/(m^2) as calculated from the following:   Height as of 10/27/14: 5\' 6"  (1.676 m).   Weight as of 10/27/14: 131.09 kg (289 lb).   Imaging Review Plain radiographs demonstrate severe degenerative joint disease of the left knee(s). The overall alignment ismild varus. The bone quality appears to be good for age and reported activity level.  Assessment/Plan:  End stage arthritis, left knee   The patient history, physical examination, clinical judgment of the provider and imaging studies are consistent with end stage degenerative joint disease of the left knee(s) and total knee arthroplasty is deemed medically necessary. The treatment options including medical management, injection therapy arthroscopy and arthroplasty were discussed at length. The risks and benefits of total knee arthroplasty were presented and reviewed. The risks due to aseptic loosening, infection, stiffness, patella tracking problems, thromboembolic complications and other imponderables were discussed. The patient acknowledged the explanation, agreed to proceed with the plan and consent was signed. Patient is being admitted for inpatient treatment for surgery, pain control, PT, OT, prophylactic antibiotics, VTE prophylaxis, progressive ambulation and ADL's and discharge planning. The patient is planning to be discharged home with home health services

## 2014-11-24 ENCOUNTER — Other Ambulatory Visit: Payer: Self-pay

## 2014-11-24 LAB — CBC
HCT: 34.9 % — ABNORMAL LOW (ref 36.0–46.0)
Hemoglobin: 11.4 g/dL — ABNORMAL LOW (ref 12.0–15.0)
MCH: 30.8 pg (ref 26.0–34.0)
MCHC: 32.7 g/dL (ref 30.0–36.0)
MCV: 94.3 fL (ref 78.0–100.0)
Platelets: 237 10*3/uL (ref 150–400)
RBC: 3.7 MIL/uL — ABNORMAL LOW (ref 3.87–5.11)
RDW: 14.1 % (ref 11.5–15.5)
WBC: 6.5 10*3/uL (ref 4.0–10.5)

## 2014-11-24 LAB — BASIC METABOLIC PANEL
Anion gap: 5 (ref 5–15)
BUN: 13 mg/dL (ref 6–20)
CO2: 25 mmol/L (ref 22–32)
Calcium: 8.8 mg/dL — ABNORMAL LOW (ref 8.9–10.3)
Chloride: 104 mmol/L (ref 101–111)
Creatinine, Ser: 0.85 mg/dL (ref 0.44–1.00)
GFR calc Af Amer: >60 mL/min (ref >60)
GFR calc non Af Amer: >60 mL/min (ref >60)
Glucose, Bld: 130 mg/dL — ABNORMAL HIGH (ref 65–99)
Potassium: 3.5 mmol/L (ref 3.5–5.1)
Sodium: 134 mmol/L — ABNORMAL LOW (ref 135–145)

## 2014-11-24 MED ORDER — MORPHINE SULFATE 4 MG/ML IJ SOLN
4.0000 mg | INTRAMUSCULAR | Status: DC | PRN
  Administered 2014-11-24 – 2014-11-26 (×12): 4 mg via INTRAVENOUS
  Filled 2014-11-24 (×12): qty 1

## 2014-11-24 MED ORDER — TOPIRAMATE 100 MG PO TABS: 100.0000 mg | ORAL_TABLET | Freq: Every day | ORAL | Status: DC

## 2014-11-24 MED ORDER — KETOROLAC TROMETHAMINE 15 MG/ML IJ SOLN
15.0000 mg | Freq: Four times a day (QID) | INTRAMUSCULAR | Status: DC | PRN
  Administered 2014-11-24 – 2014-11-26 (×5): 15 mg via INTRAVENOUS
  Filled 2014-11-24 (×5): qty 1

## 2014-11-24 NOTE — Discharge Instructions (Signed)
INSTRUCTIONS AFTER JOINT REPLACEMENT  ° °o Remove items at home which could result in a fall. This includes throw rugs or furniture in walking pathways °o ICE to the affected joint every three hours while awake for 30 minutes at a time, for at least the first 3-5 days, and then as needed for pain and swelling.  Continue to use ice for pain and swelling. You may notice swelling that will progress down to the foot and ankle.  This is normal after surgery.  Elevate your leg when you are not up walking on it.   °o Continue to use the breathing machine you got in the hospital (incentive spirometer) which will help keep your temperature down.  It is common for your temperature to cycle up and down following surgery, especially at night when you are not up moving around and exerting yourself.  The breathing machine keeps your lungs expanded and your temperature down. ° ° °DIET:  As you were doing prior to hospitalization, we recommend a well-balanced diet. ° °DRESSING / WOUND CARE / SHOWERING ° °Keep the surgical dressing until follow up.  The dressing is water proof, so you can shower without any extra covering.  IF THE DRESSING FALLS OFF or the wound gets wet inside, change the dressing with sterile gauze.  Please use good hand washing techniques before changing the dressing.  Do not use any lotions or creams on the incision until instructed by your surgeon.   ° °ACTIVITY ° °o Increase activity slowly as tolerated, but follow the weight bearing instructions below.   °o No driving for 6 weeks or until further direction given by your physician.  You cannot drive while taking narcotics.  °o No lifting or carrying greater than 10 lbs. until further directed by your surgeon. °o Avoid periods of inactivity such as sitting longer than an hour when not asleep. This helps prevent blood clots.  °o You may return to work once you are authorized by your doctor.  ° ° ° °WEIGHT BEARING  ° °Weight bearing as tolerated with assist  device (walker, cane, etc) as directed, use it as long as suggested by your surgeon or therapist, typically at least 4-6 weeks. ° ° °EXERCISES ° °Results after joint replacement surgery are often greatly improved when you follow the exercise, range of motion and muscle strengthening exercises prescribed by your doctor. Safety measures are also important to protect the joint from further injury. Any time any of these exercises cause you to have increased pain or swelling, decrease what you are doing until you are comfortable again and then slowly increase them. If you have problems or questions, call your caregiver or physical therapist for advice.  ° °Rehabilitation is important following a joint replacement. After just a few days of immobilization, the muscles of the leg can become weakened and shrink (atrophy).  These exercises are designed to build up the tone and strength of the thigh and leg muscles and to improve motion. Often times heat used for twenty to thirty minutes before working out will loosen up your tissues and help with improving the range of motion but do not use heat for the first two weeks following surgery (sometimes heat can increase post-operative swelling).  ° °These exercises can be done on a training (exercise) mat, on the floor, on a table or on a bed. Use whatever works the best and is most comfortable for you.    Use music or television while you are exercising so that   the exercises are a pleasant break in your day. This will make your life better with the exercises acting as a break in your routine that you can look forward to.   Perform all exercises about fifteen times, three times per day or as directed.  You should exercise both the operative leg and the other leg as well. ° °Exercises include: °  °• Quad Sets - Tighten up the muscle on the front of the thigh (Quad) and hold for 5-10 seconds.   °• Straight Leg Raises - With your knee straight (if you were given a brace, keep it on),  lift the leg to 60 degrees, hold for 3 seconds, and slowly lower the leg.  Perform this exercise against resistance later as your leg gets stronger.  °• Leg Slides: Lying on your back, slowly slide your foot toward your buttocks, bending your knee up off the floor (only go as far as is comfortable). Then slowly slide your foot back down until your leg is flat on the floor again.  °• Angel Wings: Lying on your back spread your legs to the side as far apart as you can without causing discomfort.  °• Hamstring Strength:  Lying on your back, push your heel against the floor with your leg straight by tightening up the muscles of your buttocks.  Repeat, but this time bend your knee to a comfortable angle, and push your heel against the floor.  You may put a pillow under the heel to make it more comfortable if necessary.  ° °A rehabilitation program following joint replacement surgery can speed recovery and prevent re-injury in the future due to weakened muscles. Contact your doctor or a physical therapist for more information on knee rehabilitation.  ° ° °CONSTIPATION ° °Constipation is defined medically as fewer than three stools per week and severe constipation as less than one stool per week.  Even if you have a regular bowel pattern at home, your normal regimen is likely to be disrupted due to multiple reasons following surgery.  Combination of anesthesia, postoperative narcotics, change in appetite and fluid intake all can affect your bowels.  ° °YOU MUST use at least one of the following options; they are listed in order of increasing strength to get the job done.  They are all available over the counter, and you may need to use some, POSSIBLY even all of these options:   ° °Drink plenty of fluids (prune juice may be helpful) and high fiber foods °Colace 100 mg by mouth twice a day  °Senokot for constipation as directed and as needed Dulcolax (bisacodyl), take with full glass of water  °Miralax (polyethylene glycol)  once or twice a day as needed. ° °If you have tried all these things and are unable to have a bowel movement in the first 3-4 days after surgery call either your surgeon or your primary doctor.   ° °If you experience loose stools or diarrhea, hold the medications until you stool forms back up.  If your symptoms do not get better within 1 week or if they get worse, check with your doctor.  If you experience "the worst abdominal pain ever" or develop nausea or vomiting, please contact the office immediately for further recommendations for treatment. ° ° °ITCHING:  If you experience itching with your medications, try taking only a single pain pill, or even half a pain pill at a time.  You can also use Benadryl over the counter for itching or also to   help with sleep.   TED HOSE STOCKINGS:  Use stockings on both legs until for at least 2 weeks or as directed by physician office. They may be removed at night for sleeping.  MEDICATIONS:  See your medication summary on the After Visit Summary that nursing will review with you.  You may have some home medications which will be placed on hold until you complete the course of blood thinner medication.  It is important for you to complete the blood thinner medication as prescribed.  PRECAUTIONS:  If you experience chest pain or shortness of breath - call 911 immediately for transfer to the hospital emergency department.   If you develop a fever greater that 101 F, purulent drainage from wound, increased redness or drainage from wound, foul odor from the wound/dressing, or calf pain - CONTACT YOUR SURGEON.                                                   FOLLOW-UP APPOINTMENTS:  If you do not already have a post-op appointment, please call the office for an appointment to be seen by your surgeon.  Guidelines for how soon to be seen are listed in your After Visit Summary, but are typically between 1-4 weeks after surgery.  OTHER INSTRUCTIONS:   Knee  Replacement:  Do not place pillow under knee, focus on keeping the knee straight while resting. CPM instructions: 0-90 degrees, 2 hours in the morning, 2 hours in the afternoon, and 2 hours in the evening. Place foam block, curve side up under heel at all times except when in CPM or when walking.  DO NOT modify, tear, cut, or change the foam block in any way.  MAKE SURE YOU:   Understand these instructions.   Get help right away if you are not doing well or get worse.    Thank you for letting Brenda Odonnell be a part of your medical care team.  It is a privilege we respect greatly.  We hope these instructions will help you stay on track for a fast and full recovery!   INSTRUCTIONS AFTER JOINT REPLACEMENT   o Remove items at home which could result in a fall. This includes throw rugs or furniture in walking pathways o ICE to the affected joint every three hours while awake for 30 minutes at a time, for at least the first 3-5 days, and then as needed for pain and swelling.  Continue to use ice for pain and swelling. You may notice swelling that will progress down to the foot and ankle.  This is normal after surgery.  Elevate your leg when you are not up walking on it.   o Continue to use the breathing machine you got in the hospital (incentive spirometer) which will help keep your temperature down.  It is common for your temperature to cycle up and down following surgery, especially at night when you are not up moving around and exerting yourself.  The breathing machine keeps your lungs expanded and your temperature down.   DIET:  As you were doing prior to hospitalization, we recommend a well-balanced diet.  DRESSING / WOUND CARE / SHOWERING  Keep the surgical dressing until follow up.  The dressing is water proof, so you can shower without any extra covering.  IF THE DRESSING FALLS OFF or the wound gets wet  inside, change the dressing with sterile gauze.  Please use good hand washing techniques before  changing the dressing.  Do not use any lotions or creams on the incision until instructed by your surgeon.    ACTIVITY  o Increase activity slowly as tolerated, but follow the weight bearing instructions below.   o No driving for 6 weeks or until further direction given by your physician.  You cannot drive while taking narcotics.  o No lifting or carrying greater than 10 lbs. until further directed by your surgeon. o Avoid periods of inactivity such as sitting longer than an hour when not asleep. This helps prevent blood clots.  o You may return to work once you are authorized by your doctor.     WEIGHT BEARING   Weight bearing as tolerated with assist device (walker, cane, etc) as directed, use it as long as suggested by your surgeon or therapist, typically at least 4-6 weeks.   EXERCISES  Results after joint replacement surgery are often greatly improved when you follow the exercise, range of motion and muscle strengthening exercises prescribed by your doctor. Safety measures are also important to protect the joint from further injury. Any time any of these exercises cause you to have increased pain or swelling, decrease what you are doing until you are comfortable again and then slowly increase them. If you have problems or questions, call your caregiver or physical therapist for advice.   Rehabilitation is important following a joint replacement. After just a few days of immobilization, the muscles of the leg can become weakened and shrink (atrophy).  These exercises are designed to build up the tone and strength of the thigh and leg muscles and to improve motion. Often times heat used for twenty to thirty minutes before working out will loosen up your tissues and help with improving the range of motion but do not use heat for the first two weeks following surgery (sometimes heat can increase post-operative swelling).   These exercises can be done on a training (exercise) mat, on the  floor, on a table or on a bed. Use whatever works the best and is most comfortable for you.    Use music or television while you are exercising so that the exercises are a pleasant break in your day. This will make your life better with the exercises acting as a break in your routine that you can look forward to.   Perform all exercises about fifteen times, three times per day or as directed.  You should exercise both the operative leg and the other leg as well.  Exercises include:    Quad Sets - Tighten up the muscle on the front of the thigh (Quad) and hold for 5-10 seconds.    Straight Leg Raises - With your knee straight (if you were given a brace, keep it on), lift the leg to 60 degrees, hold for 3 seconds, and slowly lower the leg.  Perform this exercise against resistance later as your leg gets stronger.   Leg Slides: Lying on your back, slowly slide your foot toward your buttocks, bending your knee up off the floor (only go as far as is comfortable). Then slowly slide your foot back down until your leg is flat on the floor again.   Angel Wings: Lying on your back spread your legs to the side as far apart as you can without causing discomfort.   Hamstring Strength:  Lying on your back, push your heel against the floor  with your leg straight by tightening up the muscles of your buttocks.  Repeat, but this time bend your knee to a comfortable angle, and push your heel against the floor.  You may put a pillow under the heel to make it more comfortable if necessary.   A rehabilitation program following joint replacement surgery can speed recovery and prevent re-injury in the future due to weakened muscles. Contact your doctor or a physical therapist for more information on knee rehabilitation.    CONSTIPATION  Constipation is defined medically as fewer than three stools per week and severe constipation as less than one stool per week.  Even if you have a regular bowel pattern at home, your  normal regimen is likely to be disrupted due to multiple reasons following surgery.  Combination of anesthesia, postoperative narcotics, change in appetite and fluid intake all can affect your bowels.   YOU MUST use at least one of the following options; they are listed in order of increasing strength to get the job done.  They are all available over the counter, and you may need to use some, POSSIBLY even all of these options:    Drink plenty of fluids (prune juice may be helpful) and high fiber foods Colace 100 mg by mouth twice a day  Senokot for constipation as directed and as needed Dulcolax (bisacodyl), take with full glass of water  Miralax (polyethylene glycol) once or twice a day as needed.  If you have tried all these things and are unable to have a bowel movement in the first 3-4 days after surgery call either your surgeon or your primary doctor.    If you experience loose stools or diarrhea, hold the medications until you stool forms back up.  If your symptoms do not get better within 1 week or if they get worse, check with your doctor.  If you experience "the worst abdominal pain ever" or develop nausea or vomiting, please contact the office immediately for further recommendations for treatment.   ITCHING:  If you experience itching with your medications, try taking only a single pain pill, or even half a pain pill at a time.  You can also use Benadryl over the counter for itching or also to help with sleep.   TED HOSE STOCKINGS:  Use stockings on both legs until for at least 2 weeks or as directed by physician office. They may be removed at night for sleeping.  MEDICATIONS:  See your medication summary on the After Visit Summary that nursing will review with you.  You may have some home medications which will be placed on hold until you complete the course of blood thinner medication.  It is important for you to complete the blood thinner medication as prescribed.  PRECAUTIONS:   If you experience chest pain or shortness of breath - call 911 immediately for transfer to the hospital emergency department.   If you develop a fever greater that 101 F, purulent drainage from wound, increased redness or drainage from wound, foul odor from the wound/dressing, or calf pain - CONTACT YOUR SURGEON.                                                   FOLLOW-UP APPOINTMENTS:  If you do not already have a post-op appointment, please call the office for an appointment to be  seen by your surgeon.  Guidelines for how soon to be seen are listed in your After Visit Summary, but are typically between 1-4 weeks after surgery.  OTHER INSTRUCTIONS:   Knee Replacement:  Do not place pillow under knee, focus on keeping the knee straight while resting. CPM instructions: 0-90 degrees, 2 hours in the morning, 2 hours in the afternoon, and 2 hours in the evening. Place foam block, curve side up under heel at all times except when in CPM or when walking.  DO NOT modify, tear, cut, or change the foam block in any way.  MAKE SURE YOU:   Understand these instructions.   Get help right away if you are not doing well or get worse.    Thank you for letting Brenda Odonnell be a part of your medical care team.  It is a privilege we respect greatly.  We hope these instructions will help you stay on track for a fast and full recovery!    Information on my medicine - XARELTO (Rivaroxaban) This medication education was reviewed with me or my healthcare representative as part of my discharge preparation.  The pharmacist that spoke with me during my hospital stay was:  Altha Harm  Why was Xarelto prescribed for you? Xarelto was prescribed for you to reduce the risk of blood clots forming after orthopedic surgery. The medical term for these abnormal blood clots is venous thromboembolism (VTE).  What do you need to know about xarelto ? Take your Xarelto ONCE DAILY at the same time every day. You may take it either  with or without food.  If you have difficulty swallowing the tablet whole, you may crush it and mix in applesauce just prior to taking your dose.  Take Xarelto exactly as prescribed by your doctor and DO NOT stop taking Xarelto without talking to the doctor who prescribed the medication.  Stopping without other VTE prevention medication to take the place of Xarelto may increase your risk of developing a clot.  After discharge, you should have regular check-up appointments with your healthcare provider that is prescribing your Xarelto.    What do you do if you miss a dose? If you miss a dose, take it as soon as you remember on the same day then continue your regularly scheduled once daily regimen the next day. Do not take two doses of Xarelto on the same day.   Important Safety Information A possible side effect of Xarelto is bleeding. You should call your healthcare provider right away if you experience any of the following: ? Bleeding from an injury or your nose that does not stop. ? Unusual colored urine (red or dark brown) or unusual colored stools (red or black). ? Unusual bruising for unknown reasons. ? A serious fall or if you hit your head (even if there is no bleeding).  Some medicines may interact with Xarelto and might increase your risk of bleeding while on Xarelto. To help avoid this, consult your healthcare provider or pharmacist prior to using any new prescription or non-prescription medications, including herbals, vitamins, non-steroidal anti-inflammatory drugs (NSAIDs) and supplements.  This website has more information on Xarelto: [email or web address]

## 2014-11-24 NOTE — Evaluation (Signed)
Physical Therapy Evaluation Patient Details Name: Brenda Odonnell MRN: [id number] DOB: 24-Mar-1966 Today's Date: 11/24/2014   History of Present Illness  L TKA  Clinical Impression  Patient reports pain meds not helping. RN aware.Patient did  Ambulate  12'. Patient will benefit from PT while in acute care  to address problems listed in note below.    Follow Up Recommendations SNF;Supervision/Assistance - 24 hour    Equipment Recommendations  Rolling walker with 5" wheels    Recommendations for Other Services       Precautions / Restrictions Precautions Precautions: Knee Required Braces or Orthoses: Knee Immobilizer - Left      Mobility  Bed Mobility Overal bed mobility: Needs Assistance Bed Mobility: Supine to Sit     Supine to sit: Mod assist     General bed mobility comments: use of rail and bed rails.  Transfers Overall transfer level: Needs assistance Equipment used: Rolling walker (2 wheeled) Transfers: Sit to/from Stand Sit to Stand: Min assist         General transfer comment: cues for hand and LLE position  Ambulation/Gait Ambulation/Gait assistance: Min assist Ambulation Distance (Feet): 12 Feet Assistive device: Rolling walker (2 wheeled) Gait Pattern/deviations: Step-to pattern;Antalgic;Decreased stance time - left     General Gait Details: cues for safety, sequence  Stairs            Wheelchair Mobility    Modified Rankin (Stroke Patients Only)       Balance                                             Pertinent Vitals/Pain Pain Assessment: 0-10 Pain Score: 10-Worst pain ever Pain Location: L knee Pain Descriptors / Indicators: Aching Pain Intervention(s): Limited activity within patient's tolerance;Repositioned;Monitored during session;Patient requesting pain meds-RN notified;Ice applied    Home Living Family/patient expects to be discharged to:: Skilled nursing facility Living Arrangements:  Alone Available Help at Discharge: Family Type of Home: House Home Access: Level entry     Home Layout: One level Home Equipment: None      Prior Function Level of Independence: Independent               Hand Dominance        Extremity/Trunk Assessment   Upper Extremity Assessment: Overall WFL for tasks assessed           Lower Extremity Assessment: LLE deficits/detail   LLE Deficits / Details: assist for SLR.  Cervical / Trunk Assessment: Normal  Communication   Communication: No difficulties  Cognition Arousal/Alertness: Awake/alert Behavior During Therapy: WFL for tasks assessed/performed Overall Cognitive Status: Within Functional Limits for tasks assessed                      General Comments      Exercises Total Joint Exercises Quad Sets: AROM;Both;5 reps;Supine      Assessment/Plan    PT Assessment Patient needs continued PT services  PT Diagnosis Difficulty walking;Acute pain   PT Problem List Decreased strength;Decreased range of motion;Decreased activity tolerance;Decreased mobility;Decreased knowledge of precautions;Decreased safety awareness;Decreased knowledge of use of DME;Pain  PT Treatment Interventions DME instruction;Gait training;Functional mobility training;Therapeutic activities;Therapeutic exercise;Patient/family education   PT Goals (Current goals can be found in the Care Plan section) Acute Rehab PT Goals Patient Stated Goal: to go to rehab. PT Goal Formulation: With patient Time For  Goal Achievement: 12/01/14 Potential to Achieve Goals: Good    Frequency 7X/week   Barriers to discharge        Co-evaluation               End of Session Equipment Utilized During Treatment: Left knee immobilizer Activity Tolerance: Patient limited by pain Patient left: in chair;with call bell/phone within reach Nurse Communication: Mobility status         Time: 0518-3358 PT Time Calculation (min) (ACUTE ONLY): 21  min   Charges:   PT Evaluation $Initial PT Evaluation Tier I: 1 Procedure     PT G CodesClaretha Cooper 11/24/2014, 12:48 PM

## 2014-11-24 NOTE — Progress Notes (Signed)
Offered to assist pt with nocturnal cpap, but she declined.  RN notified.  Pt stated that she can do it and does not need any help.  Nasal cpap on 11cm per home settings with 2l o2 bleedin.  Sterile water added to humidifier.

## 2014-11-24 NOTE — Progress Notes (Signed)
   Subjective:  Patient reports pain as marked.    Objective:   VITALS:   Filed Vitals:   11/23/14 2206 11/23/14 2302 11/24/14 0111 11/24/14 0425  BP: 150/83 133/79 113/73 112/68  Pulse: 84 74 83 83  Temp: 97.7 F (36.5 C) 98 F (36.7 C) 98 F (36.7 C) 98.1 F (36.7 C)  TempSrc: Oral Oral Oral Oral  Resp: 18 18 18 18   Height:      Weight:      SpO2: 97% 98% 98% 98%    Neurologically intact Neurovascular intact Sensation intact distally Intact pulses distally Dorsiflexion/Plantar flexion intact Incision: dressing C/D/I and no drainage No cellulitis present Compartment soft weak quad function   Lab Results  Component Value Date   WBC 6.5 11/24/2014   HGB 11.4* 11/24/2014   HCT 34.9* 11/24/2014   MCV 94.3 11/24/2014   PLT 237 11/24/2014     Assessment/Plan:  1 Day Post-Op   - Expected postop acute blood loss anemia - will monitor for symptoms - Up with PT/OT - DVT ppx - SCDs, ambulation, xarelto  - WBAT left lower extremity - Pain control - Discharge planning  Marianna Payment 11/24/2014, 8:39 AM [phone]

## 2014-11-24 NOTE — Progress Notes (Signed)
Physical Therapy Treatment Patient Details Name: Brenda Odonnell MRN: [id number] DOB: 04/30/1966 Today's Date: 11/24/2014    History of Present Illness L TKA    PT Comments    Pt progressed well with mobility this afternoon. She walked 74' with RW and performed TKA exercises with min assist.   Follow Up Recommendations  SNF;Supervision/Assistance - 24 hour     Equipment Recommendations  Rolling walker with 5" wheels    Recommendations for Other Services       Precautions / Restrictions Precautions Precautions: Knee Required Braces or Orthoses: Knee Immobilizer - Left    Mobility  Bed Mobility Overal bed mobility: Needs Assistance Bed Mobility: Sit to Supine     Supine to sit: Mod assist Sit to supine: Min assist   General bed mobility comments: Min A for LLE into bed, verbal cues for technique  Transfers Overall transfer level: Needs assistance Equipment used: Rolling walker (2 wheeled) Transfers: Sit to/from Stand Sit to Stand: Min assist         General transfer comment: cues for hand and LLE position, min A to rise from recliner, min/guard from [facility]  Ambulation/Gait Ambulation/Gait assistance: Min assist Ambulation Distance (Feet): 75 Feet Assistive device: Rolling walker (2 wheeled) Gait Pattern/deviations: Step-through pattern     General Gait Details: good sequencing, no LOB   Stairs            Wheelchair Mobility    Modified Rankin (Stroke Patients Only)       Balance                                    Cognition Arousal/Alertness: Awake/alert Behavior During Therapy: WFL for tasks assessed/performed Overall Cognitive Status: Within Functional Limits for tasks assessed                      Exercises Total Joint Exercises Ankle Circles/Pumps: AROM;Both;15 reps;Supine Quad Sets: AROM;Left;5 reps;Supine Short Arc Quad: AAROM;Left;10 reps;Supine Heel Slides: AAROM;Left;10 reps;Supine Hip  ABduction/ADduction: AAROM;Left;10 reps;Supine Straight Leg Raises: AAROM;Left;10 reps;Supine Goniometric ROM: 0-40* L knee AAROM    General Comments        Pertinent Vitals/Pain Pain Assessment: 0-10 Pain Score: 10-Worst pain ever Pain Location: L knee Pain Descriptors / Indicators: Aching Pain Intervention(s): Limited activity within patient's tolerance;Monitored during session;Premedicated before session;Ice applied    Home Living Family/patient expects to be discharged to:: Skilled nursing facility Living Arrangements: Alone Available Help at Discharge: Family Type of Home: House Home Access: Level entry   Home Layout: One level Home Equipment: None      Prior Function Level of Independence: Independent          PT Goals (current goals can now be found in the care plan section) Acute Rehab PT Goals Patient Stated Goal: to be able to walk, be able to bend to floor to pickup 1st grandchild that is due to be born in December PT Goal Formulation: With patient/family Time For Goal Achievement: 12/01/14 Potential to Achieve Goals: Good Progress towards PT goals: Progressing toward goals    Frequency  7X/week    PT Plan Current plan remains appropriate    Co-evaluation             End of Session Equipment Utilized During Treatment: Left knee immobilizer Activity Tolerance: Patient limited by pain Patient left: with call bell/phone within reach;with family/visitor present     Time: 1329-1416 PT  Time Calculation (min) (ACUTE ONLY): 47 min  Charges:  $Gait Training: 8-22 mins $Therapeutic Exercise: 8-22 mins $Therapeutic Activity: 8-22 mins                    G Codes:      Philomena Doheny 11/24/2014, 2:20 PM [phone]

## 2014-11-24 NOTE — Progress Notes (Signed)
OT Cancellation Note  Patient Details Name: Almena Hokenson MRN: [id number] DOB: 02/28/1966   Cancelled Treatment:    Reason Eval/Treat Not Completed: Other (comment) Pt had high pain with PT.  Will check back tomorrow  Alithia Zavaleta 11/24/2014, 12:19 PM  Lesle Chris, OTR/L [phone] 11/24/2014

## 2014-11-25 LAB — CBC
HCT: 30.7 % — ABNORMAL LOW (ref 36.0–46.0)
HEMOGLOBIN: 9.8 g/dL — AB (ref 12.0–15.0)
MCH: 29.6 pg (ref 26.0–34.0)
MCHC: 31.9 g/dL (ref 30.0–36.0)
MCV: 92.7 fL (ref 78.0–100.0)
PLATELETS: 193 10*3/uL (ref 150–400)
RBC: 3.31 MIL/uL — ABNORMAL LOW (ref 3.87–5.11)
RDW: 13.7 % (ref 11.5–15.5)
WBC: 7 10*3/uL (ref 4.0–10.5)

## 2014-11-25 NOTE — Clinical Social Work Note (Signed)
Clinical Social Work Assessment  Patient Details  Name: Brenda Odonnell MRN: [id number] Date of Birth: [birth year]  Date of referral:  11/24/14               Reason for consult:  Facility Placement                Permission sought to share information with:  Chartered certified accountant granted to share information::  Yes, Verbal Permission Granted  Name::        Agency::     Relationship::     Contact Information:     Housing/Transportation Living arrangements for the past 2 months:  Single Family Home Source of Information:  Patient Patient Interpreter Needed:  None Criminal Activity/Legal Involvement Pertinent to Current Situation/Hospitalization:    Significant Relationships:  Adult Children, Significant Other Lives with:  Self Do you feel safe going back to the place where you live?    Need for family participation in patient care:  No (Coment)  Care giving concerns:  No caregiver   Facilities manager / plan:  CSW met with pt at bedside to discuss discharge services.  CSW explained role of CSW and prompted pt to discuss her history and current needs.  CSW provided list of possible SNF's that pt may choose from if she accepts rehab.  CSW encouraged pt to explore her thoughts and feelings related to her diagnoses and rehab needs.  CSW provided active and supportive listening.  CSW will sent pt information to SNFs in [place] area  Employment status:  Disabled (Comment on whether or not currently receiving Disability) Insurance information:  Medicare, Medicaid In [place] PT Recommendations:  [place] / Referral to community resources:     Patient/Family's Response to care:  Pt had trouble staying awake but did discuss her history and thoughts on rehab.  Pt stated that she has been married and divorced three times and has two grown daughters.  Pt stated that she lives alone but does not have any steps in her home.  Pt stated that she  has a boyfriend that is supportive but he works two jobs so he will not be available to help pt at discharge.  Pt stated that she has never been to rehab but believes she needs to go at discharge.  Pt grateful for CSW support  Patient/Family's Understanding of and Emotional Response to Diagnosis, Current Treatment, and Prognosis:  Pt appears to understand her need for rehab and did not appear to be in any distress over her decision to go for her first time at discharge  Emotional Assessment Appearance:  Appears stated age Attitude/Demeanor/Rapport:  Lethargic Affect (typically observed):  Accepting Orientation:  Oriented to Self, Oriented to Place, Oriented to  Time, Oriented to Situation Alcohol / Substance use:    Psych involvement (Current and /or in the community):  No (Comment)  Discharge Needs  Concerns to be addressed:    Readmission within the last 30 days:  No Current discharge risk:    Barriers to Discharge:  No Barriers Identified   Carlean Jews, LCSW 11/25/2014, 8:35 AM

## 2014-11-25 NOTE — Progress Notes (Signed)
Physical Therapy Treatment Patient Details Name: Brenda Odonnell MRN: [id number] DOB: 1966/04/28 Today's Date: 11/25/2014    History of Present Illness L TKA    PT Comments    Patient falling asleep frequently, verbal stimulation frequently to keep patient aroused to complete  Exercises. Plans SNF rehab.  Follow Up Recommendations  SNF;Supervision/Assistance - 24 hour     Equipment Recommendations  Rolling walker with 5" wheels    Recommendations for Other Services       Precautions / Restrictions Precautions Precautions: Knee Required Braces or Orthoses: Knee Immobilizer - Left Restrictions Weight Bearing Restrictions: No    Mobility  Bed Mobility   Bed Mobility: Supine to Sit     Supine to sit: Supervision     General bed mobility comments: used leg lifter and rails, HOB raised  Transfers   Equipment used: Rolling walker (2 wheeled) Transfers: Sit to/from Stand Sit to Stand: Min assist         General transfer comment: posterior balance loss upin standing and tying robe, steady assist provided.  Ambulation/Gait Ambulation/Gait assistance: Min assist Ambulation Distance (Feet): 50 Feet Assistive device: Rolling walker (2 wheeled) Gait Pattern/deviations: Step-to pattern;Step-through pattern     General Gait Details: good sequencing, no LOB   Stairs            Wheelchair Mobility    Modified Rankin (Stroke Patients Only)       Balance                                    Cognition Arousal/Alertness: Lethargic;Suspect due to medications                          Exercises Total Joint Exercises Ankle Circles/Pumps: AROM;Both;Supine;10 reps Quad Sets: AROM;Supine;10 reps;Both Towel Squeeze: AROM;Both;10 reps Heel Slides: AAROM;Left;10 reps;Supine Hip ABduction/ADduction: AAROM;Left;10 reps;Supine Straight Leg Raises: AAROM;Left;10 reps;Supine Goniometric ROM: 0-50 L knee AAROM    General Comments         Pertinent Vitals/Pain Pain Score: 10-Worst pain ever Pain Location: L knee and R upper trap Pain Descriptors / Indicators: Aching Pain Intervention(s): Monitored during session;RN gave pain meds during session;Ice applied;Heat applied    Home Living Family/patient expects to be discharged to:: Skilled nursing facility                    Prior Function            PT Goals (current goals can now be found in the care plan section) Progress towards PT goals: Progressing toward goals    Frequency  7X/week    PT Plan Current plan remains appropriate    Co-evaluation             End of Session Equipment Utilized During Treatment: Left knee immobilizer Activity Tolerance: Patient tolerated treatment well Patient left: in chair;with call bell/phone within reach     Time: 0902-0950 PT Time Calculation (min) (ACUTE ONLY): 48 min  Charges:  $Gait Training: 8-22 mins $Therapeutic Exercise: 8-22 mins $Self Care/Home Management: 8-22                    G Codes:      Claretha Cooper 11/25/2014, 9:58 AM Tresa Endo PT [phone]

## 2014-11-25 NOTE — Progress Notes (Signed)
Physical Therapy Treatment Patient Details Name: Brenda Odonnell MRN: [id number] DOB: 12/01/1965 Today's Date: 11/25/2014    History of Present Illness L TKA    PT Comments    Patient with audible wheezing, encouraged IS. remains VERY LETHARGIC.  Follow Up Recommendations  SNF;Supervision/Assistance - 24 hour     Equipment Recommendations  Rolling walker with 5" wheels    Recommendations for Other Services       Precautions / Restrictions Precautions Precautions: Knee Required Braces or Orthoses: Knee Immobilizer - Left    Mobility  Bed Mobility   Bed Mobility: Sit to Supine     Supine to sit: Supervision Sit to supine: Min guard   General bed mobility comments: used Leg lifter to assist L leg onto bed.  Transfers Overall transfer level: Needs assistance Equipment used: Rolling walker (2 wheeled) Transfers: Sit to/from Stand Sit to Stand: Min assist         General transfer comment: extra time, very drowsy  Ambulation/Gait Ambulation/Gait assistance: Min assist Ambulation Distance (Feet): 5 Feet Assistive device: Rolling walker (2 wheeled) Gait Pattern/deviations: Step-to pattern;Step-through pattern     General Gait Details: step to, extra time    Stairs            Wheelchair Mobility    Modified Rankin (Stroke Patients Only)       Balance                                    Cognition Arousal/Alertness: Lethargic                          Exercises Total Joint Exercises Ankle Circles/Pumps: AROM;Both;Supine;10 reps Quad Sets: AROM;Supine;10 reps;Both Towel Squeeze: AROM;Both;10 reps Heel Slides: AAROM;Left;10 reps;Supine Hip ABduction/ADduction: AAROM;Left;10 reps;Supine Straight Leg Raises: AAROM;Left;10 reps;Supine Goniometric ROM: 0-50 L knee AAROM    General Comments        Pertinent Vitals/Pain Pain Score: 6  Pain Location: L knee and R trap Pain Descriptors / Indicators:  Aching;Tightness;Sore Pain Intervention(s): Limited activity within patient's tolerance;Monitored during session;Premedicated before session;Ice applied    Home Living Family/patient expects to be discharged to:: Skilled nursing facility Living Arrangements: Alone Available Help at Discharge: Family Type of Home: House Home Access: Level entry   Home Layout: One level Home Equipment: None      Prior Function Level of Independence: Independent          PT Goals (current goals can now be found in the care plan section) Acute Rehab PT Goals Patient Stated Goal: to be able to walk, be able to bend to floor to pickup 1st grandchild that is due to be born in December Progress towards PT goals: Progressing toward goals    Frequency  7X/week    PT Plan Current plan remains appropriate    Co-evaluation             End of Session Equipment Utilized During Treatment: Left knee immobilizer Activity Tolerance: Patient limited by lethargy Patient left: in bed;with call bell/phone within reach     Time: 1220-1241 PT Time Calculation (min) (ACUTE ONLY): 21 min  Charges:  $Gait Training: 8-22 mins $Therapeutic Exercise: 8-22 mins $Self Care/Home Management: 01-03-2023                    G Codes:      Claretha Cooper 11/25/2014, 1:41 PM

## 2014-11-25 NOTE — Progress Notes (Signed)
   Subjective:  Patient reports pain as improved  Objective:   VITALS:   Filed Vitals:   11/24/14 0425 11/24/14 1342 11/24/14 2134 11/25/14 0620  BP: 112/68 117/61 126/69 107/67  Pulse: 83 96 98 105  Temp: 98.1 F (36.7 C) 98.6 F (37 C) 99.7 F (37.6 C) 99.4 F (37.4 C)  TempSrc: Oral Oral Oral Oral  Resp: 18 18 16 16   Height:      Weight:      SpO2: 98% 99% 100% 99%    Incision c/d/i   Lab Results  Component Value Date   WBC 7.0 11/25/2014   HGB 9.8* 11/25/2014   HCT 30.7* 11/25/2014   MCV 92.7 11/25/2014   PLT 193 11/25/2014     Assessment/Plan:  2 Days Post-Op   - Expected postop acute blood loss anemia - will monitor for symptoms - Up with PT/OT - DVT ppx - SCDs, ambulation, xarelto  - WBAT left lower extremity - Pain control - dressing changed - Discharge planning - SNF   Marianna Payment 11/25/2014, 9:26 AM [phone]

## 2014-11-25 NOTE — Evaluation (Signed)
Occupational Therapy Evaluation Patient Details Name: Brenda Odonnell MRN: [id number] DOB: May 22, 1965 Today's Date: 11/25/2014    History of Present Illness L TKA   Clinical Impression   Pt is s/p TKA resulting in the deficits listed below (see OT Problem List). Pt will benefit from skilled OT to increase their safety and independence with ADL and functional mobility for ADL to facilitate discharge to venue listed below.        Follow Up Recommendations  SNF          Precautions / Restrictions Precautions Precautions: Knee Required Braces or Orthoses: Knee Immobilizer - Left Restrictions Weight Bearing Restrictions: No      Mobility Bed Mobility   Bed Mobility: Supine to Sit     Supine to sit: Supervision     General bed mobility comments: pt in chair  Transfers Overall transfer level: Needs assistance Equipment used: Rolling walker (2 wheeled) Transfers: Sit to/from Stand Sit to Stand: Min assist         General transfer comment: posterior balance loss upin standing and tying robe, steady assist provided.         ADL Overall ADL's : Needs assistance/impaired     Grooming: Wash/dry face;Sitting   Upper Body Bathing: Minimal assitance;Sitting   Lower Body Bathing: Maximal assistance;Sit to/from stand   Upper Body Dressing : Minimal assistance;Sitting   Lower Body Dressing: Maximal assistance;Sit to/from stand                                 Pertinent Vitals/Pain Pain Score: 7  Pain Location: r upper trap Pain Descriptors / Indicators: Sore Pain Intervention(s): Ice applied;Repositioned;Premedicated before session     Hand Dominance     Extremity/Trunk Assessment Upper Extremity Assessment Upper Extremity Assessment: Generalized weakness           Communication Communication Communication: No difficulties   Cognition Arousal/Alertness: Lethargic;Suspect due to medications                         General  Comments               Home Living Family/patient expects to be discharged to:: Skilled nursing facility Living Arrangements: Alone Available Help at Discharge: Family Type of Home: House Home Access: Level entry     Home Layout: One level               Home Equipment: None          Prior Functioning/Environment Level of Independence: Independent             OT Diagnosis: Generalized weakness;Acute pain   OT Problem List: Decreased strength;Decreased activity tolerance;Pain   OT Treatment/Interventions: Self-care/ADL training;DME and/or AE instruction;Patient/family education    OT Goals(Current goals can be found in the care plan section) Acute Rehab OT Goals Patient Stated Goal: to be able to walk, be able to bend to floor to pickup 1st grandchild that is due to be born in December OT Goal Formulation: With patient Time For Goal Achievement: 12/02/14 Potential to Achieve Goals: Good  OT Frequency: Min 2X/week   Barriers to D/C: Decreased caregiver support             End of Session CPM Left Knee CPM Left Knee: Off Nurse Communication: Mobility status  Activity Tolerance: Patient limited by fatigue Patient left: in chair     Charges:  OT General  Charges $OT Visit: 1 Procedure OT Evaluation $Initial OT Evaluation Tier I: 1 Procedure OT Treatments $Self Care/Home Management : 8-22 mins G-Codes:    Payton Mccallum D 12/10/2014, 11:22 AM

## 2014-11-25 NOTE — Plan of Care (Signed)
Problem: Phase III Progression Outcomes Goal: Anticoagulant follow-up in place Outcome: Not Applicable Date Met:  87/21/58 xarelto

## 2014-11-26 ENCOUNTER — Encounter (HOSPITAL_COMMUNITY): Payer: Self-pay | Admitting: Orthopaedic Surgery

## 2014-11-26 LAB — CBC
HCT: 29.3 % — ABNORMAL LOW (ref 36.0–46.0)
Hemoglobin: 9.4 g/dL — ABNORMAL LOW (ref 12.0–15.0)
MCH: 29.8 pg (ref 26.0–34.0)
MCHC: 32.1 g/dL (ref 30.0–36.0)
MCV: 93 fL (ref 78.0–100.0)
Platelets: 182 10*3/uL (ref 150–400)
RBC: 3.15 MIL/uL — AB (ref 3.87–5.11)
RDW: 13.7 % (ref 11.5–15.5)
WBC: 8.2 10*3/uL (ref 4.0–10.5)

## 2014-11-26 MED ORDER — OXYCODONE HCL 5 MG PO TABS: 5.0000 mg | ORAL_TABLET | ORAL | Status: DC | PRN

## 2014-11-26 MED ORDER — OXYCODONE HCL ER 20 MG PO T12A: 40.0000 mg | EXTENDED_RELEASE_TABLET | Freq: Two times a day (BID) | ORAL | Status: DC

## 2014-11-26 MED ORDER — METHOCARBAMOL 500 MG PO TABS: 500.0000 mg | ORAL_TABLET | Freq: Four times a day (QID) | ORAL | Status: DC | PRN

## 2014-11-26 MED ORDER — RIVAROXABAN 10 MG PO TABS: 10.0000 mg | ORAL_TABLET | Freq: Every day | ORAL | Status: DC

## 2014-11-26 NOTE — Progress Notes (Signed)
Occupational Therapy Treatment Patient Details Name: Brenda Odonnell MRN: [id number] DOB: 08/29/1965 Today's Date: 11/26/2014    History of present illness L TKA   OT comments  DC to SNF this day  Follow Up Recommendations  SNF          Precautions / Restrictions Precautions Precautions: Knee Required Braces or Orthoses: Knee Immobilizer - Left Restrictions Weight Bearing Restrictions: No       Mobility Bed Mobility Overal bed mobility: Needs Assistance Bed Mobility: Sit to Supine       Sit to supine: Min guard   General bed mobility comments: used Leg lifter to assist L leg onto bed.  Transfers Overall transfer level: Needs assistance Equipment used: Rolling walker (2 wheeled) Transfers: Sit to/from Stand Sit to Stand: Min assist                  ADL               Lower Body Bathing: Minimal assistance;Sit to/from stand;With adaptive equipment;Cueing for safety Lower Body Bathing Details (indicate cue type and reason): AE education             Toileting- Clothing Manipulation and Hygiene: Minimal assistance;Sit to/from stand         General ADL Comments: AE Education         Perception     Praxis      Cognition   Behavior During Therapy: WFL for tasks assessed/performed Overall Cognitive Status: Within Functional Limits for tasks assessed                         Exercises     Shoulder Instructions       General Comments      Pertinent Vitals/ Pain       Pain Score: 5  Pain Location: L knee and R neck area Pain Descriptors / Indicators: Sore Pain Intervention(s): Repositioned;Heat applied;Monitored during session;Other (comment) (heat on neckk/R trap area)  Home Living                                              Frequency Min 2X/week     Progress Toward Goals  OT Goals(current goals can now be found in the care plan section)  Progress towards OT goals: Progressing toward goals      Plan         End of Session CPM Left Knee CPM Left Knee: Off   Activity Tolerance Patient limited by fatigue   Patient Left in chair   Nurse Communication Mobility status        Time: 4098-1191 OT Time Calculation (min): 11 min  Charges: OT General Charges $OT Visit: 1 Procedure OT Treatments $Self Care/Home Management : 8-22 mins  Ethel Veronica, Edwena Felty D 11/26/2014, 12:08 PM

## 2014-11-26 NOTE — Discharge Summary (Signed)
Patient ID: Brilynn Biasi MRN: [id number] DOB/AGE: 50-10-1965 50 y.o.  Admit date: 11/23/2014 Discharge date: 11/26/2014  Admission Diagnoses:  Principal Problem:   Osteoarthritis of left knee Active Problems:   Status post total left knee replacement   Discharge Diagnoses:  Same  Past Medical History  Diagnosis Date  . Sinus infection   . Hypertension   . Arthritis   . Spinal stenosis   . Glaucoma   . Carpal tunnel syndrome   . Depression   . Bipolar 2 disorder   . Obesity   . Arthritis   . Glaucoma   . Leiomyoma of uterus, unspecified   . Sleep apnea   . MS (multiple sclerosis)   . Migraine   . Spinal stenosis in cervical region 09/07/2014    C5-6 level  . GERD (gastroesophageal reflux disease)     Surgeries: Procedure(s): LEFT TOTAL KNEE ARTHROPLASTY on 11/23/2014   Consultants:    Discharged Condition: Improved  Hospital Course: Avacyn Kloosterman is an 49 y.o. female who was admitted 11/23/2014 for operative treatment ofOsteoarthritis of left knee. Patient has severe unremitting pain that affects sleep, daily activities, and work/hobbies. After pre-op clearance the patient was taken to the operating room on 11/23/2014 and underwent  Procedure(s): LEFT TOTAL KNEE ARTHROPLASTY.    Patient was given perioperative antibiotics: Anti-infectives    Start     Dose/Rate Route Frequency Ordered Stop   11/23/14 2200  ceFAZolin (ANCEF) IVPB 2 g/50 mL premix     2 g 100 mL/hr over 30 Minutes Intravenous Every 6 hours 11/23/14 2003 11/24/14 0446   11/23/14 0600  ceFAZolin (ANCEF) 3 g in dextrose 5 % 50 mL IVPB     3 g 160 mL/hr over 30 Minutes Intravenous On call to O.R. 11/22/14 1355 11/23/14 1539       Patient was given sequential compression devices, early ambulation, and chemoprophylaxis to prevent DVT.  Patient benefited maximally from hospital stay and there were no complications.    Recent vital signs: Patient Vitals for the past 24 hrs:  BP Temp Temp src Pulse  Resp SpO2  11/26/14 0524 102/61 mmHg 98.6 F (37 C) Oral 94 16 96 %  11/25/14 2205 - - - 98 16 99 %  11/25/14 2111 103/60 mmHg 99.8 F (37.7 C) Oral (!) 106 16 97 %     Recent laboratory studies:  Recent Labs  11/24/14 0538 11/25/14 0637 11/26/14 0429  WBC 6.5 7.0 8.2  HGB 11.4* 9.8* 9.4*  HCT 34.9* 30.7* 29.3*  PLT 237 193 182  NA 134*  --   --   K 3.5  --   --   CL 104  --   --   CO2 25  --   --   BUN 13  --   --   CREATININE 0.85  --   --   GLUCOSE 130*  --   --   CALCIUM 8.8*  --   --      Discharge Medications:     Medication List    STOP taking these medications        oxyCODONE-acetaminophen 10-325 MG per tablet  Commonly known as:  PERCOCET      TAKE these medications        amphetamine-dextroamphetamine 30 MG 24 hr capsule  Commonly known as:  ADDERALL XR  Take 30 mg by mouth daily.     azithromycin 250 MG tablet  Commonly known as:  ZITHROMAX Z-PAK  Take 2 tablets po  load, then 1 tablet po daily.     clotrimazole 1 % cream  Commonly known as:  LOTRIMIN  Apply 1 application topically 2 (two) times daily.     COMBIGAN 0.2-0.5 % ophthalmic solution  Generic drug:  brimonidine-timolol  Place 1 drop into both eyes 2 (two) times daily.     diazepam 10 MG tablet  Commonly known as:  VALIUM  Take 10 mg by mouth daily as needed for anxiety (before MRI procedure).     diclofenac sodium 1 % Gel  Commonly known as:  VOLTAREN  Apply 2 g topically daily as needed. For knee and foot pain     dorzolamide 2 % ophthalmic solution  Commonly known as:  TRUSOPT  Place 1 drop into both eyes 2 (two) times daily.     FLUoxetine 20 MG capsule  Commonly known as:  PROZAC  Take 20 mg by mouth daily.     FLUoxetine 40 MG capsule  Commonly known as:  PROZAC  Take 1 capsule (40 mg total) by mouth daily.     fluticasone 50 MCG/ACT nasal spray  Commonly known as:  FLONASE  Place 1 spray into both nostrils daily as needed for allergies or rhinitis.      furosemide 40 MG tablet  Commonly known as:  LASIX  Take 40 mg by mouth daily.     gabapentin 600 MG tablet  Commonly known as:  NEURONTIN  Take 600 mg by mouth 3 (three) times daily.     lamoTRIgine 200 MG tablet  Commonly known as:  LAMICTAL  Take 200 mg by mouth daily.     lisinopril 40 MG tablet  Commonly known as:  PRINIVIL,ZESTRIL  Take 1 tablet (40 mg total) by mouth daily.     LORazepam 0.5 MG tablet  Commonly known as:  ATIVAN  Take 0.5 mg by mouth 3 (three) times daily.     methocarbamol 500 MG tablet  Commonly known as:  ROBAXIN  Take 2 tablets (1,000 mg total) by mouth 4 (four) times daily as needed (Pain).     methocarbamol 500 MG tablet  Commonly known as:  ROBAXIN  Take 1 tablet (500 mg total) by mouth every 6 (six) hours as needed for muscle spasms.     metroNIDAZOLE 500 MG tablet  Commonly known as:  FLAGYL  Take 1 tablet (500 mg total) by mouth 2 (two) times daily.     omeprazole 40 MG capsule  Commonly known as:  PRILOSEC  Take 1 capsule (40 mg total) by mouth daily.     ondansetron 4 MG tablet  Commonly known as:  ZOFRAN  Take 1 tablet (4 mg total) by mouth every 8 (eight) hours as needed for nausea or vomiting.     oxyCODONE 5 MG immediate release tablet  Commonly known as:  Oxy IR/ROXICODONE  Take 1-3 tablets (5-15 mg total) by mouth every 3 (three) hours as needed for severe pain.     OxyCODONE 20 mg T12a 12 hr tablet  Commonly known as:  OXYCONTIN  Take 2 tablets (40 mg total) by mouth every 12 (twelve) hours.     rivaroxaban 10 MG Tabs tablet  Commonly known as:  XARELTO  Take 1 tablet (10 mg total) by mouth daily with breakfast.     topiramate 100 MG tablet  Commonly known as:  TOPAMAX  Take 1 tablet (100 mg total) by mouth daily.     triamcinolone cream 0.1 %  Commonly known as:  KENALOG  Apply 1 application topically daily as needed (skin irritations).     valACYclovir 500 MG tablet  Commonly known as:  VALTREX  1 po bid x 3  days prn.     Vitamin D (Ergocalciferol) 50000 UNITS Caps capsule  Commonly known as:  DRISDOL  Take 50,000 Units by mouth every 7 (seven) days. Sunday     zolpidem 10 MG tablet  Commonly known as:  AMBIEN  Take 10 mg by mouth at bedtime.        Diagnostic Studies: Dg Knee 2 Views Left  10/27/2014   CLINICAL DATA:  Pt reports chronic left knee pain that has become more severe since yesterday, no relief with pain meds at home. Hx arthritis  EXAM: LEFT KNEE - 1-2 VIEW  COMPARISON:  None.  FINDINGS: Mild osteophyte formation of the lateral compartment. Severe narrowing and moderate osteophyte formation of the medial compartment with severe narrowing and severe osteophyte formation of the patellofemoral compartment. Large posterior osteophytes present. No definite joint effusion. No fracture or dislocation.  IMPRESSION: Severe arthritis   Electronically Signed   By: Skipper Cliche M.D.   On: 10/27/2014 18:43   US Transvaginal Non-ob  11/08/2014   CLINICAL DATA:  Left lower quadrant pelvic pain.  EXAM: TRANSABDOMINAL AND TRANSVAGINAL ULTRASOUND OF PELVIS  TECHNIQUE: Both transabdominal and transvaginal ultrasound examinations of the pelvis were performed. Transabdominal technique was performed for global imaging of the pelvis including uterus, ovaries, adnexal regions, and pelvic cul-de-sac. It was necessary to proceed with endovaginal exam following the transabdominal exam to visualize the adnexal structures.  COMPARISON:  MRI lumbar spine 05/09/2012; CT abdomen pelvis 05/01/2010.  FINDINGS: Uterus  Surgically absent.  Right ovary  Not visualized.  Left ovary  Not visualized.  Other findings  No free fluid.  IMPRESSION: Uterus is surgically absent. The ovaries are not visualized. No large fluid collection.   Electronically Signed   By: Lovey Newcomer M.D.   On: 11/08/2014 15:08   US Pelvis Complete  11/08/2014   CLINICAL DATA:  Left lower quadrant pelvic pain.  EXAM: TRANSABDOMINAL AND TRANSVAGINAL  ULTRASOUND OF PELVIS  TECHNIQUE: Both transabdominal and transvaginal ultrasound examinations of the pelvis were performed. Transabdominal technique was performed for global imaging of the pelvis including uterus, ovaries, adnexal regions, and pelvic cul-de-sac. It was necessary to proceed with endovaginal exam following the transabdominal exam to visualize the adnexal structures.  COMPARISON:  MRI lumbar spine 05/09/2012; CT abdomen pelvis 05/01/2010.  FINDINGS: Uterus  Surgically absent.  Right ovary  Not visualized.  Left ovary  Not visualized.  Other findings  No free fluid.  IMPRESSION: Uterus is surgically absent. The ovaries are not visualized. No large fluid collection.   Electronically Signed   By: Lovey Newcomer M.D.   On: 11/08/2014 15:08   Dg Knee Left Port  11/23/2014   CLINICAL DATA:  Status post left total knee replacement today.  EXAM: PORTABLE LEFT KNEE - 1-2 VIEW  COMPARISON:  Plain films right knee 10/27/2014.  FINDINGS: Left total knee arthroplasty is in place. The device is located and there is no fracture. Gas in the soft tissues and surgical staples are noted.  IMPRESSION: Left total knee replacement bones complication.   Electronically Signed   By: Inge Rise M.D.   On: 11/23/2014 18:01   Mm Digital Screening  11/09/2014   CLINICAL DATA:  Screening.  EXAM: DIGITAL SCREENING BILATERAL MAMMOGRAM WITH CAD  COMPARISON:  Previous exam(s).  ACR Breast Density Category  a: The breast tissue is almost entirely fatty.  FINDINGS: There are no findings suspicious for malignancy. Images were processed with CAD.  IMPRESSION: No mammographic evidence of malignancy. A result letter of this screening mammogram will be mailed directly to the patient.  RECOMMENDATION: Screening mammogram in one year. (Code:[id number]-B-01Y)  BI-RADS CATEGORY  1: Negative.   Electronically Signed   By: Evangeline Dakin M.D.   On: 11/09/2014 07:43    Disposition: to skilled nursing facility      Discharge Instructions     Discharge patient    Complete by:  As directed            Follow-up Information    Follow up with Mcarthur Rossetti, MD In 2 weeks.   Specialty:  Orthopedic Surgery   Contact information:   [place] [address] [phone]        Signed: Mcarthur Rossetti 11/26/2014, 2:33 PM

## 2014-11-26 NOTE — Progress Notes (Signed)
Physical Therapy Treatment Patient Details Name: Brenda Odonnell MRN: [id number] DOB: 06-27-1965 Today's Date: 11/26/2014    History of Present Illness L TKA    PT Comments    POD # 3 pt in bed just received pain meds.  Started with TKR TE's however was unable to complete all due to level of sleepiness.  So assisted OOB to amb in hallway.  Pt tolerated increased distance.  Assisted back to bed per pt request.   Follow Up Recommendations  SNF     Equipment Recommendations       Recommendations for Other Services       Precautions / Restrictions Precautions Precautions: Knee Precaution Comments: instructed pt on KI use for amb Required Braces or Orthoses: Knee Immobilizer - Left Restrictions Weight Bearing Restrictions: No    Mobility  Bed Mobility Overal bed mobility: Needs Assistance Bed Mobility: Supine to Sit;Sit to Supine     Supine to sit: Min assist Sit to supine: Min assist   General bed mobility comments: assist with L LE and increased time  Transfers Overall transfer level: Needs assistance Equipment used: Rolling walker (2 wheeled) Transfers: Sit to/from Stand Sit to Stand: Min guard         General transfer comment: extra time, very drowsy  Ambulation/Gait Ambulation/Gait assistance: Min guard Ambulation Distance (Feet): 45 Feet Assistive device: Rolling walker (2 wheeled) Gait Pattern/deviations: Step-to pattern;Decreased stance time - right;Trunk flexed     General Gait Details: increased time   Science writer    Modified Rankin (Stroke Patients Only)       Balance                                    Cognition Arousal/Alertness: Awake/alert (groggy/sleepy) Behavior During Therapy: WFL for tasks assessed/performed Overall Cognitive Status: Within Functional Limits for tasks assessed                      Exercises   10 reps B LE AP 10 reps B LE knee presses Unable to continue  due to level of sleepyness   General Comments        Pertinent Vitals/Pain Pain Assessment: 0-10 Pain Score: 8  Pain Location: L knee and neck Pain Descriptors / Indicators: Aching;Sore Pain Intervention(s): Monitored during session;Premedicated before session;Repositioned    Home Living                      Prior Function            PT Goals (current goals can now be found in the care plan section) Progress towards PT goals: Progressing toward goals    Frequency  7X/week    PT Plan Current plan remains appropriate    Co-evaluation             End of Session Equipment Utilized During Treatment: Left knee immobilizer Activity Tolerance: Patient tolerated treatment well (still groggy) Patient left: in bed;with call bell/phone within reach     Time: 1761-6073 PT Time Calculation (min) (ACUTE ONLY): 28 min  Charges:  $Gait Training: 8-22 mins $Therapeutic Exercise: 8-22 mins                    G Codes:      Rica Koyanagi  PTA [facility]  Acute  Rehab Pager  [phone]  

## 2014-11-26 NOTE — Progress Notes (Signed)
Utilization review completed.  

## 2014-11-26 NOTE — Clinical Social Work Placement (Signed)
Patient has accepted bed at [facility] SNF. Patient's friend, Freda Munro aware. Anticipating discharge this afternoon.     Raynaldo Opitz, [facility] Clinical Social Worker cell #: [phone]    CLINICAL SOCIAL WORK PLACEMENT  NOTE  Date:  11/26/2014  Patient Details  Name: Brenda Odonnell MRN: [id number] Date of Birth: 04-30-1966  Clinical Social Work is seeking post-discharge placement for this patient at the [place] level of care (*CSW will initial, date and re-position this form in  chart as items are completed):  Yes   Patient/family provided with [place] Work Department's list of facilities offering this level of care within the geographic area requested by the patient (or if unable, by the patient's family).  Yes   Patient/family informed of their freedom to choose among providers that offer the needed level of care, that participate in Medicare, Medicaid or managed care program needed by the patient, have an available bed and are willing to accept the patient.  Yes   Patient/family informed of [place]'s ownership interest in [facility] and [facility], as well as of the fact that they are under no obligation to receive care at these facilities.  PASRR submitted to EDS on 11/25/14     PASRR number received on 11/25/14     Existing PASRR number confirmed on       FL2 transmitted to all facilities in geographic area requested by pt/family on 11/25/14     FL2 transmitted to all facilities within larger geographic area on       Patient informed that his/her managed care company has contracts with or will negotiate with certain facilities, including the following:        Yes   Patient/family informed of bed offers received.  Patient chooses bed at [facility]     Physician recommends and patient chooses bed at      Patient to be transferred to [facility] on  .  Patient to be transferred to facility by       Patient family notified on   of transfer.  Name of family member notified:        PHYSICIAN       Additional Comment:    _______________________________________________ Standley Brooking, LCSW 11/26/2014, 10:50 AM

## 2014-11-26 NOTE — Care Management Important Message (Signed)
Important Message  Patient Details  Name: Brenda Odonnell MRN: [id number] Date of Birth: 08/02/1965   Medicare Important Message Given:  Yes-second notification given    Camillo Flaming 11/26/2014, 1:50 [place] Message  Patient Details  Name: Brenda Odonnell MRN: [id number] Date of Birth: May 14, 1965   Medicare Important Message Given:  Yes-second notification given    Camillo Flaming 11/26/2014, 1:50 PM

## 2014-11-26 NOTE — Care Management Note (Signed)
Case Management Note  Patient Details  Name: Miamor Ayler MRN: [id number] Date of Birth: Jun 29, 1965  Subjective/Objective:    49 y/o f admitted w/OA L Knee.                 Action/Plan:d/c SNF.   Expected Discharge Date:                  Expected Discharge Plan:  Skilled Nursing Facility  In-House Referral:  Clinical Social Work  Discharge planning Services  CM Consult  Post Acute Care Choice:    Choice offered to:     DME Arranged:    DME Agency:     HH Arranged:    [place] Agency:     Status of Service:  Completed, signed off  Medicare Important Message Given:    Date Medicare IM Given:    Medicare IM give by:    Date Additional Medicare IM Given:    Additional Medicare Important Message give by:     If discussed at [place] of Stay Meetings, dates discussed:    Additional Comments:  Dessa Phi, RN 11/26/2014, 11:59 AM

## 2014-11-26 NOTE — Clinical Social Work Placement (Signed)
Patient is set to discharge to [facility] SNF today. Patient & friend, Freda Munro aware. Discharge packet given to RN, Judson Roch. [facility] called for transport.     Raynaldo Opitz, [facility] Clinical Social Worker cell #: [phone]    CLINICAL SOCIAL WORK PLACEMENT  NOTE  Date:  11/26/2014  Patient Details  Name: Brenda Odonnell MRN: [id number] Date of Birth: 07/26/1965  Clinical Social Work is seeking post-discharge placement for this patient at the [place] level of care (*CSW will initial, date and re-position this form in  chart as items are completed):  Yes   Patient/family provided with [place] Work Department's list of facilities offering this level of care within the geographic area requested by the patient (or if unable, by the patient's family).  Yes   Patient/family informed of their freedom to choose among providers that offer the needed level of care, that participate in Medicare, Medicaid or managed care program needed by the patient, have an available bed and are willing to accept the patient.  Yes   Patient/family informed of [place]'s ownership interest in [facility] and [facility], as well as of the fact that they are under no obligation to receive care at these facilities.  PASRR submitted to EDS on 11/25/14     PASRR number received on 11/25/14     Existing PASRR number confirmed on       FL2 transmitted to all facilities in geographic area requested by pt/family on 11/25/14     FL2 transmitted to all facilities within larger geographic area on       Patient informed that his/her managed care company has contracts with or will negotiate with certain facilities, including the following:        Yes   Patient/family informed of bed offers received.  Patient chooses bed at [facility]     Physician recommends and patient chooses bed at      Patient to  be transferred to [facility] on 11/26/14.  Patient to be transferred to facility by [facility]     Patient family notified on 11/26/14 of transfer.  Name of family member notified:  patient's friend, Freda Munro via phone     PHYSICIAN       Additional Comment:    _______________________________________________ Standley Brooking, LCSW 11/26/2014, 3:03 PM

## 2014-11-26 NOTE — Progress Notes (Signed)
Subjective: 3 Days Post-Op Procedure(s) (LRB): LEFT TOTAL KNEE ARTHROPLASTY (Left) Patient reports pain as severe.  However, her vitals are stable and she appears very comfortable.    Objective: Vital signs in last 24 hours: Temp:  [98.6 F (37 C)-99.8 F (37.7 C)] 98.6 F (37 C) (07/25 0524) Pulse Rate:  [94-106] 94 (07/25 0524) Resp:  [16] 16 (07/25 0524) BP: (102-109)/(60-64) 102/61 mmHg (07/25 0524) SpO2:  [96 %-100 %] 96 % (07/25 0524)  Intake/Output from previous day: 07/24 0701 - 07/25 0700 In: 3462.5 [P.O.:360; I.V.:3102.5] Out: 0  Intake/Output this shift:     Recent Labs  11/24/14 0538 11/25/14 0637 11/26/14 0429  HGB 11.4* 9.8* 9.4*    Recent Labs  11/25/14 0637 11/26/14 0429  WBC 7.0 8.2  RBC 3.31* 3.15*  HCT 30.7* 29.3*  PLT 193 182    Recent Labs  11/24/14 0538  NA 134*  K 3.5  CL 104  CO2 25  BUN 13  CREATININE 0.85  GLUCOSE 130*  CALCIUM 8.8*   No results for input(s): LABPT, INR in the last 72 hours.  Sensation intact distally Intact pulses distally Dorsiflexion/Plantar flexion intact Incision: dressing C/D/I No cellulitis present Compartment soft  Assessment/Plan: 3 Days Post-Op Procedure(s) (LRB): LEFT TOTAL KNEE ARTHROPLASTY (Left) Up with therapy Discharge to SNF  [facility] 11/26/2014, 7:37 AM

## 2014-11-27 ENCOUNTER — Non-Acute Institutional Stay (SKILLED_NURSING_FACILITY): Payer: Medicare Other | Admitting: Internal Medicine

## 2014-11-27 ENCOUNTER — Encounter: Payer: Self-pay | Admitting: Internal Medicine

## 2014-11-27 DIAGNOSIS — G35 Multiple sclerosis: Secondary | ICD-10-CM | POA: Diagnosis not present

## 2014-11-27 DIAGNOSIS — M4802 Spinal stenosis, cervical region: Secondary | ICD-10-CM | POA: Diagnosis not present

## 2014-11-27 DIAGNOSIS — Z96652 Presence of left artificial knee joint: Secondary | ICD-10-CM

## 2014-11-27 DIAGNOSIS — F332 Major depressive disorder, recurrent severe without psychotic features: Secondary | ICD-10-CM

## 2014-11-27 DIAGNOSIS — I10 Essential (primary) hypertension: Secondary | ICD-10-CM

## 2014-11-27 DIAGNOSIS — G441 Vascular headache, not elsewhere classified: Secondary | ICD-10-CM | POA: Diagnosis not present

## 2014-11-27 NOTE — Progress Notes (Signed)
[name], female   DOB: 29-Dec-1965, 49 y.o.   MRN: [id number]    HISTORY AND PHYSICAL   DATE: 11/27/14  Location:  [facility]    Place of Service: SNF [phone])   Extended Emergency Contact Information Primary Emergency Contact: Brenda Odonnell          [place] Phone: [phone] Mobile Phone: [phone] Relation: Friend Secondary Emergency Contact: Brenda Odonnell Johnnette [place] of [place] Phone: [phone] Relation: Daughter  Advanced Directive information  FULL CODE  Chief Complaint  Patient presents with  . New Admit To SNF    HPI:  49 yo female seen today as a new admission into SNF following hospital stay for left TKR due to OA. She also has a hx HTN, spinal stenosis of C spine, CTS, bipolar and depression, MS, sleep apnea, GERD and migraine HA. D/c H/H 9.4/29.3. She was tx for acute sinusitis with a Z pak. She has been urinating in her bed and on the floor per nursing. Pt denies any loss of bowel/bladder control and states she did not have anyway to get to the bathroom as she was in severe pain at the time. She now has a bedside commode. Pain is 9/10 on scale in left knee. She is tolerating PT. She is taking xeralto for anticoagulation  Bipolar/MDD - mood stable on prozac, lamictal, lorazepam  MS - stable on Adderall xr and gabapentin  HTN - stable on lasix, lisinopril  Chronic pain/spinal stenosis - stable on oxycodone and oxycontin ER. She also takes robaxin  GERD - stable on omeprazole and prn zofran  Migraine HA - stable on topamax  OSA - stable. She is not on CPAP  Glaucoma - stable on eye gtts  She is taking vitamins/minerals and supplements. She takes flagyl for prevention  Past Medical History  Diagnosis Date  . Sinus infection   . Hypertension   . Arthritis   . Spinal stenosis   . Glaucoma   . Carpal tunnel syndrome   . Depression   .  Bipolar 2 disorder   . Obesity   . Arthritis   . Glaucoma   . Leiomyoma of uterus, unspecified   . Sleep apnea   . MS (multiple sclerosis)   . Migraine   . Spinal stenosis in cervical region 09/07/2014    C5-6 level  . GERD (gastroesophageal reflux disease)     Past Surgical History  Procedure Laterality Date  . Carpal tunnel release Bilateral   . Abdominal hysterectomy    . Knee surgery Left     arthroscopic  . Myelogram    . Cesarean section    . Breast reduction surgery    . Total knee arthroplasty Left 11/23/2014    Procedure: LEFT TOTAL KNEE ARTHROPLASTY;  Surgeon: Mcarthur Rossetti, MD;  Location: [facility];  Service: Orthopedics;  Laterality: Left;    Patient Care Team: Antonietta Jewel, MD as PCP - General (Internal Medicine)  History   Social History  . Marital Status: Divorced    Spouse Name: Brenda Odonnell   . Number of Children: 3  . Years of Education: bachelor's   Occupational History  .     Social History Main Topics  . Smoking status: Never Smoker   . Smokeless tobacco: Never Used  . Alcohol Use: Yes     Comment: socially, red wine  . Drug Use: No  .  Sexual Activity:    Partners: Male    Patent examiner Protection: Surgical   Other Topics Concern  . Not on file   Social History Narrative   Patient is married to Brenda Odonnell.    Patient has 3 children.    Patient denies caffeine.       Patient is right handed.   Patient does not drink caffeine.        reports that she has never smoked. She has never used smokeless tobacco. She reports that she drinks alcohol. She reports that she does not use illicit drugs.  Family History  Problem Relation Age of Onset  . Hypertension    . Hypertension Father   . Hyperlipidemia Father   . Fibromyalgia Mother   . Diabetes Mother   . Healthy Sister   . Healthy Brother    Family Status  Relation Status Death Age  . Father Alive   . Mother Alive   . Sister Alive   . Brother Alive      There is no  immunization history on file for this patient.  Allergies  Allergen Reactions  . Ibuprofen Nausea And Vomiting    Due to gird    Medications: Patient's Medications  New Prescriptions   No medications on file  Previous Medications   AMPHETAMINE-DEXTROAMPHETAMINE (ADDERALL XR) 30 MG 24 HR CAPSULE    Take 30 mg by mouth daily.   AZITHROMYCIN (ZITHROMAX Z-PAK) 250 MG TABLET    Take 2 tablets po load, then 1 tablet po daily.   BRIMONIDINE-TIMOLOL (COMBIGAN) 0.2-0.5 % OPHTHALMIC SOLUTION    Place 1 drop into both eyes 2 (two) times daily.   CLOTRIMAZOLE (LOTRIMIN) 1 % CREAM    Apply 1 application topically 2 (two) times daily.   DIAZEPAM (VALIUM) 10 MG TABLET    Take 10 mg by mouth daily as needed for anxiety (before MRI procedure).    DICLOFENAC SODIUM (VOLTAREN) 1 % GEL    Apply 2 g topically daily as needed. For knee and foot pain   DORZOLAMIDE (TRUSOPT) 2 % OPHTHALMIC SOLUTION    Place 1 drop into both eyes 2 (two) times daily.    FLUOXETINE (PROZAC) 20 MG CAPSULE    Take 20 mg by mouth daily.   FLUOXETINE (PROZAC) 40 MG CAPSULE    Take 1 capsule (40 mg total) by mouth daily.   FLUTICASONE (FLONASE) 50 MCG/ACT NASAL SPRAY    Place 1 spray into both nostrils daily as needed for allergies or rhinitis.    FUROSEMIDE (LASIX) 40 MG TABLET    Take 40 mg by mouth daily.   GABAPENTIN (NEURONTIN) 600 MG TABLET    Take 600 mg by mouth 3 (three) times daily.   LAMOTRIGINE (LAMICTAL) 200 MG TABLET    Take 200 mg by mouth daily.   LISINOPRIL (PRINIVIL,ZESTRIL) 40 MG TABLET    Take 1 tablet (40 mg total) by mouth daily.   LORAZEPAM (ATIVAN) 0.5 MG TABLET    Take 0.5 mg by mouth 3 (three) times daily.    METHOCARBAMOL (ROBAXIN) 500 MG TABLET    Take 2 tablets (1,000 mg total) by mouth 4 (four) times daily as needed (Pain).   METHOCARBAMOL (ROBAXIN) 500 MG TABLET    Take 1 tablet (500 mg total) by mouth every 6 (six) hours as needed for muscle spasms.   METRONIDAZOLE (FLAGYL) 500 MG TABLET    Take 1  tablet (500 mg total) by mouth 2 (two) times daily.   OMEPRAZOLE (  PRILOSEC) 40 MG CAPSULE    Take 1 capsule (40 mg total) by mouth daily.   ONDANSETRON (ZOFRAN) 4 MG TABLET    Take 1 tablet (4 mg total) by mouth every 8 (eight) hours as needed for nausea or vomiting.   OXYCODONE (OXY IR/ROXICODONE) 5 MG IMMEDIATE RELEASE TABLET    Take 1-3 tablets (5-15 mg total) by mouth every 3 (three) hours as needed for severe pain.   OXYCODONE (OXYCONTIN) 20 MG T12A 12 HR TABLET    Take 2 tablets (40 mg total) by mouth every 12 (twelve) hours.   RIVAROXABAN (XARELTO) 10 MG TABS TABLET    Take 1 tablet (10 mg total) by mouth daily with breakfast.   TOPIRAMATE (TOPAMAX) 100 MG TABLET    Take 1 tablet (100 mg total) by mouth daily.   TRIAMCINOLONE CREAM (KENALOG) 0.1 %    Apply 1 application topically daily as needed (skin irritations).    VALACYCLOVIR (VALTREX) 500 MG TABLET    1 po bid x 3 days prn.   VITAMIN D, ERGOCALCIFEROL, (DRISDOL) 50000 UNITS CAPS CAPSULE    Take 50,000 Units by mouth every 7 (seven) days. Sunday   ZOLPIDEM (AMBIEN) 10 MG TABLET    Take 10 mg by mouth at bedtime.  Modified Medications   No medications on file  Discontinued Medications   No medications on file    Review of Systems  Unable to perform ROS: Psychiatric disorder    Filed Vitals:   11/27/14 1657  BP: 114/72  Pulse: 100  Temp: 97.7 F (36.5 C)  SpO2: 96%   There is no weight on file to calculate BMI.  Physical Exam  Constitutional: She appears well-developed.  Sitting on edge of bed with left knee dangling. Looks well in NAD  HENT:  Mouth/Throat: Oropharynx is clear and moist. No oropharyngeal exudate.  Eyes: Pupils are equal, round, and reactive to light. No scleral icterus.  Neck: Neck supple. Carotid bruit is not present. No tracheal deviation present. No thyromegaly present.  Cardiovascular: Normal rate, regular rhythm, normal heart sounds and intact distal pulses.  Exam reveals no gallop and no friction  rub.   No murmur heard. +1 LLE pitting edema. No RLE edema. No calf TTP  Pulmonary/Chest: Effort normal and breath sounds normal. No stridor. No respiratory distress. She has no wheezes. She has no rales.  Abdominal: Soft. Bowel sounds are normal. She exhibits no distension and no mass. There is no hepatomegaly. There is no tenderness. There is no rebound and no guarding.  Musculoskeletal: She exhibits edema.  Left knee bandage c/d/i  Lymphadenopathy:    She has no cervical adenopathy.  Neurological: She is alert.  Skin: Skin is warm and dry. No rash noted.  Psychiatric: Her behavior is normal. Her mood appears anxious.     Labs reviewed: Admission on 11/23/2014, Discharged on 11/26/2014  Component Date Value Ref Range Status  . ABO/RH(D) 11/23/2014 O POS   Final  . Antibody Screen 11/23/2014 NEG   Final  . Sample Expiration 11/23/2014 11/26/2014   Final  . ABO/RH(D) 11/23/2014 O POS   Final  . WBC 11/24/2014 6.5  4.0 - 10.5 K/uL Final  . RBC 11/24/2014 3.70* 3.87 - 5.11 MIL/uL Final  . Hemoglobin 11/24/2014 11.4* 12.0 - 15.0 g/dL Final  . HCT 11/24/2014 34.9* 36.0 - 46.0 % Final  . MCV 11/24/2014 94.3  78.0 - 100.0 fL Final  . MCH 11/24/2014 30.8  26.0 - 34.0 pg Final  . MCHC  11/24/2014 32.7  30.0 - 36.0 g/dL Final  . RDW 11/24/2014 14.1  11.5 - 15.5 % Final  . Platelets 11/24/2014 237  150 - 400 K/uL Final  . Sodium 11/24/2014 134* 135 - 145 mmol/L Final  . Potassium 11/24/2014 3.5  3.5 - 5.1 mmol/L Final  . Chloride 11/24/2014 104  101 - 111 mmol/L Final  . CO2 11/24/2014 25  22 - 32 mmol/L Final  . Glucose, Bld 11/24/2014 130* 65 - 99 mg/dL Final  . BUN 11/24/2014 13  6 - 20 mg/dL Final  . Creatinine, Ser 11/24/2014 0.85  0.44 - 1.00 mg/dL Final  . Calcium 11/24/2014 8.8* 8.9 - 10.3 mg/dL Final  . GFR calc non Af Amer 11/24/2014 >60  >60 mL/min Final  . GFR calc Af Amer 11/24/2014 >60  >60 mL/min Final   Comment: (NOTE) The eGFR has been calculated using the CKD EPI  equation. This calculation has not been validated in all clinical situations. eGFR's persistently <60 mL/min signify possible Chronic Kidney Disease.   . Anion gap 11/24/2014 5  5 - 15 Final  . WBC 11/25/2014 7.0  4.0 - 10.5 K/uL Final  . RBC 11/25/2014 3.31* 3.87 - 5.11 MIL/uL Final  . Hemoglobin 11/25/2014 9.8* 12.0 - 15.0 g/dL Final  . HCT 11/25/2014 30.7* 36.0 - 46.0 % Final  . MCV 11/25/2014 92.7  78.0 - 100.0 fL Final  . MCH 11/25/2014 29.6  26.0 - 34.0 pg Final  . MCHC 11/25/2014 31.9  30.0 - 36.0 g/dL Final  . RDW 11/25/2014 13.7  11.5 - 15.5 % Final  . Platelets 11/25/2014 193  150 - 400 K/uL Final  . WBC 11/26/2014 8.2  4.0 - 10.5 K/uL Final  . RBC 11/26/2014 3.15* 3.87 - 5.11 MIL/uL Final  . Hemoglobin 11/26/2014 9.4* 12.0 - 15.0 g/dL Final  . HCT 11/26/2014 29.3* 36.0 - 46.0 % Final  . MCV 11/26/2014 93.0  78.0 - 100.0 fL Final  . MCH 11/26/2014 29.8  26.0 - 34.0 pg Final  . MCHC 11/26/2014 32.1  30.0 - 36.0 g/dL Final  . RDW 11/26/2014 13.7  11.5 - 15.5 % Final  . Platelets 11/26/2014 182  150 - 400 K/uL Final  Hospital Outpatient Visit on 11/16/2014  Component Date Value Ref Range Status  . Prothrombin Time 11/16/2014 13.6  11.6 - 15.2 seconds Final  . INR 11/16/2014 1.02  0.00 - 1.49 Final  . aPTT 11/16/2014 31  24 - 37 seconds Final  . MRSA, PCR 11/16/2014 NEGATIVE  NEGATIVE Final  . Staphylococcus aureus 11/16/2014 NEGATIVE  NEGATIVE Final   Comment:        The Xpert SA Assay (FDA approved for NASAL specimens in patients over 70 years of age), is one component of a comprehensive surveillance program.  Test performance has been validated by [facility] for patients greater than or equal to 54 year old. It is not intended to diagnose infection nor to guide or monitor treatment.   . Sodium 11/16/2014 138  135 - 145 mmol/L Final  . Potassium 11/16/2014 4.1  3.5 - 5.1 mmol/L Final  . Chloride 11/16/2014 108  101 - 111 mmol/L Final  . CO2 11/16/2014 26  22 -  32 mmol/L Final  . Glucose, Bld 11/16/2014 95  65 - 99 mg/dL Final  . BUN 11/16/2014 16  6 - 20 mg/dL Final  . Creatinine, Ser 11/16/2014 1.19* 0.44 - 1.00 mg/dL Final  . Calcium 11/16/2014 9.1  8.9 - 10.3 mg/dL  Final  . GFR calc non Af Amer 11/16/2014 53* >60 mL/min Final  . GFR calc Af Amer 11/16/2014 >60  >60 mL/min Final   Comment: (NOTE) The eGFR has been calculated using the CKD EPI equation. This calculation has not been validated in all clinical situations. eGFR's persistently <60 mL/min signify possible Chronic Kidney Disease.   . Anion gap 11/16/2014 4* 5 - 15 Final  . WBC 11/16/2014 3.7* 4.0 - 10.5 K/uL Final  . RBC 11/16/2014 3.91  3.87 - 5.11 MIL/uL Final  . Hemoglobin 11/16/2014 11.8* 12.0 - 15.0 g/dL Final  . HCT 11/16/2014 36.6  36.0 - 46.0 % Final  . MCV 11/16/2014 93.6  78.0 - 100.0 fL Final  . MCH 11/16/2014 30.2  26.0 - 34.0 pg Final  . MCHC 11/16/2014 32.2  30.0 - 36.0 g/dL Final  . RDW 11/16/2014 14.1  11.5 - 15.5 % Final  . Platelets 11/16/2014 256  150 - 400 K/uL Final  Office Visit on 10/26/2014  Component Date Value Ref Range Status  . HIV 1&2 Ab, 4th Generation 10/26/2014 NONREACTIVE  NONREACTIVE Final   Comment:   HIV-1 antigen and HIV-1/HIV-2 antibodies were not detected.  There is no laboratory evidence of HIV infection.   HIV-1/2 Antibody Diff        Not indicated. HIV-1 RNA, Qual TMA          Not indicated.     PLEASE NOTE: This information has been disclosed to you from records whose confidentiality may be protected by state law. If your state requires such protection, then the state law prohibits you from making any further disclosure of the information without the specific written consent of the person to whom it pertains, or as otherwise permitted by law. A general authorization for the release of medical or other information is NOT sufficient for this purpose.   The performance of this assay has not been clinically validated in patients  less than 47 years old.   For additional information please refer to http://education.questdiagnostics.com/faq/FAQ106.  (This link is being provided for informational/educational purposes only.)     . Hepatitis B Surface Ag 10/26/2014 NEGATIVE  NEGATIVE Final  . RPR Ser Ql 10/26/2014 NON REAC  NON REAC Final  . HCV Ab 10/26/2014 NEGATIVE  NEGATIVE Final  . Colony Count 10/26/2014 30,000 COLONIES/ML   Final  . Organism ID, Bacteria 10/26/2014 Multiple bacterial morphotypes present, none   Final  . Organism ID, Bacteria 10/26/2014 predominant. Suggest appropriate recollection if    Final  . Organism ID, Bacteria 10/26/2014 clinically indicated.   Final  . BV CATEGORY 10/26/2014 REPORT   Final   Comment: BV Category                NOT SUPPORTIVE                      Reference range:  NOT SUPPORTIVE   . LACTOBACILLUS SPECIES 10/26/2014 7.3   Final  . Atopobium vaginae 10/26/2014 Not Detected   Final  . MEGASPHAERA SPECIES 10/26/2014 Not Detected   Final  . Gardnerella vaginalis 10/26/2014 Not Detected   Final   Comment: NOT SUPPORTIVE OF BV: The pattern of results is not supportive of a diagnosis of BV: 1)Presence of Lactobacillus spp., G. vaginalis levels less than 6.0 log cells/mL, and absence of A. vaginae and Megasphaera spp; or 2)Absence of all targeted organisms; or 3)Absence of Lactobacillus spp.  plus G. vaginalis detected at levels less than 6.0  log cells/mL and absence of A. vaginae and Megasphaera spp. EQUIVOCAL FOR BV: The pattern of results is neither supportive nor not supportive of a diagnosis of BV. The patient may be in transition into or out of BV: Presence of Lactobacillus spp. plus G. vaginalis (greater or equal to 6.0 log cells/mL) and/or one of the other BV-associated pathogens. SUPPORTIVE OF BV: The pattern of results is supportive of a diagnosis of BV: Absence of Lactobacillus spp. and presence of G. vaginalis greater than or equal to 6.0 log cells/mL and/or  one or both of the other BV-associated pathogens. Concentration for Lactobacilli (L. acidophilus/ cri                          spatus, L. jensenii) are collectively reported under the term "Lactobacillus spp.", as these species are among the peroxide producing Lactobacilli thought to be protective against bacterial vaginosis. Atopobium vaginae, Megasphaera spp., and Gardnerella (greater than 6.0 log cells/mL) have been associated with vaginosis when present in the absence of peroxide producing Lactobacilli. This test was developed and its analytical performance characteristics have been determined by [employer], [place], [place]. It has not been cleared or approved by the FDA. This assay has been validated pursuant to the CLIA regulations and is used for clinical purposes.   . T. vaginalis RNA, QL TMA 10/26/2014 Not Detected  Not Detected Final   Comment: This test was performed using the APTIMA Trichomonas vaginalis Assay (Gen-Probe). For more information on this test, go to http://education.questdiagnostics.com/faq/ Trichomonastma   . C. albicans, DNA 10/26/2014 Not Detected  Not Detected Final  . C. glabrata, DNA 10/26/2014 Not Detected  Not Detected Final  . C. tropicalis, DNA 10/26/2014 Not Detected  Not Detected Final  . C. parapsilosis, DNA 10/26/2014 Not Detected  Not Detected Final   Comment: This test was developed and its analytical performance characteristics have been determined by [employer], [place], [place]. It has not been cleared or approved by the FDA. This assay has been validated pursuant to the CLIA regulations and is used for clinical purposes.   . Color, UA 10/26/2014 yellow   Final  . Clarity, UA 10/26/2014 clear   Final  . Glucose, UA 10/26/2014 negative   Final  . Bilirubin, UA 10/26/2014 negative   Final  . Ketones, UA 10/26/2014 negaitve   Final  . Spec Grav, UA 10/26/2014 1.015   Final  . Blood, UA  10/26/2014 trace   Final  . pH, UA 10/26/2014 5.0   Final  . Protein, UA 10/26/2014 negative   Final  . Urobilinogen, UA 10/26/2014 negative   Final  . Nitrite, UA 10/26/2014 negative   Final  . Leukocytes, UA 10/26/2014 Negative  Negative Final  Office Visit on 09/07/2014  Component Date Value Ref Range Status  . RPR Ser Ql 09/07/2014 Non Reactive  Non Reactive Final  . Vitamin B-12 09/07/2014 388  211 - 946 pg/mL Final  . TSH 09/07/2014 2.150  0.450 - 4.500 uIU/mL Final  . Sed Rate 09/07/2014 61* 0 - 32 mm/hr Final  . Copper 09/07/2014 187* 72 - 166 ug/dL Final                                   Detection Limit = 5    US Transvaginal Non-ob  11/08/2014   CLINICAL DATA:  Left lower quadrant pelvic pain.  EXAM: TRANSABDOMINAL AND TRANSVAGINAL ULTRASOUND OF PELVIS  TECHNIQUE: Both transabdominal and transvaginal ultrasound examinations of the pelvis were performed. Transabdominal technique was performed for global imaging of the pelvis including uterus, ovaries, adnexal regions, and pelvic cul-de-sac. It was necessary to proceed with endovaginal exam following the transabdominal exam to visualize the adnexal structures.  COMPARISON:  MRI lumbar spine 05/09/2012; CT abdomen pelvis 05/01/2010.  FINDINGS: Uterus  Surgically absent.  Right ovary  Not visualized.  Left ovary  Not visualized.  Other findings  No free fluid.  IMPRESSION: Uterus is surgically absent. The ovaries are not visualized. No large fluid collection.   Electronically Signed   By: Lovey Newcomer M.D.   On: 11/08/2014 15:08   US Pelvis Complete  11/08/2014   CLINICAL DATA:  Left lower quadrant pelvic pain.  EXAM: TRANSABDOMINAL AND TRANSVAGINAL ULTRASOUND OF PELVIS  TECHNIQUE: Both transabdominal and transvaginal ultrasound examinations of the pelvis were performed. Transabdominal technique was performed for global imaging of the pelvis including uterus, ovaries, adnexal regions, and pelvic cul-de-sac. It was necessary to proceed with  endovaginal exam following the transabdominal exam to visualize the adnexal structures.  COMPARISON:  MRI lumbar spine 05/09/2012; CT abdomen pelvis 05/01/2010.  FINDINGS: Uterus  Surgically absent.  Right ovary  Not visualized.  Left ovary  Not visualized.  Other findings  No free fluid.  IMPRESSION: Uterus is surgically absent. The ovaries are not visualized. No large fluid collection.   Electronically Signed   By: Lovey Newcomer M.D.   On: 11/08/2014 15:08   Dg Knee Left Port  11/23/2014   CLINICAL DATA:  Status post left total knee replacement today.  EXAM: PORTABLE LEFT KNEE - 1-2 VIEW  COMPARISON:  Plain films right knee 10/27/2014.  FINDINGS: Left total knee arthroplasty is in place. The device is located and there is no fracture. Gas in the soft tissues and surgical staples are noted.  IMPRESSION: Left total knee replacement bones complication.   Electronically Signed   By: Inge Rise M.D.   On: 11/23/2014 18:01   Mm Digital Screening  11/09/2014   CLINICAL DATA:  Screening.  EXAM: DIGITAL SCREENING BILATERAL MAMMOGRAM WITH CAD  COMPARISON:  Previous exam(s).  ACR Breast Density Category a: The breast tissue is almost entirely fatty.  FINDINGS: There are no findings suspicious for malignancy. Images were processed with CAD.  IMPRESSION: No mammographic evidence of malignancy. A result letter of this screening mammogram will be mailed directly to the patient.  RECOMMENDATION: Screening mammogram in one year. (Code:[id number]-B-01Y)  BI-RADS CATEGORY  1: Negative.   Electronically Signed   By: Evangeline Dakin M.D.   On: 11/09/2014 07:43     Assessment/Plan   ICD-9-CM ICD-10-CM   1. Status post total left knee replacement V43.65 Z96.652   2. Spinal stenosis in cervical region 723.0 M48.02   3. Severe episode of recurrent major depressive disorder, without psychotic features ([place]) 296.33 F33.2   4. Other vascular headache 784.0 G44.1   5. MS (multiple sclerosis) ([place]) 340 G35   6. Essential  hypertension 401.9 I10     --f/u with Ortho as scheduled  --pain control  --cont PT/OT as ordered  --cont current meds as ordered  --GOAL: short term rehab and d/c home when medically appropriate. Communicated with pt and nursing.  --will follow  Myrical Andujo S. Perlie Gold  [facility] [address] [place], [place] [address] [phone] Cell (Monday-Friday 8 AM - 5  PM) [phone] After 5 PM and follow prompts

## 2014-12-10 ENCOUNTER — Other Ambulatory Visit: Payer: Self-pay | Admitting: *Deleted

## 2014-12-10 MED ORDER — OXYCODONE HCL 5 MG PO TABS: ORAL_TABLET | ORAL | Status: DC

## 2014-12-10 NOTE — Telephone Encounter (Signed)
[id number] 

## 2014-12-11 ENCOUNTER — Encounter: Payer: Self-pay | Admitting: Internal Medicine

## 2014-12-11 ENCOUNTER — Non-Acute Institutional Stay (SKILLED_NURSING_FACILITY): Payer: Medicare Other | Admitting: Internal Medicine

## 2014-12-11 DIAGNOSIS — R269 Unspecified abnormalities of gait and mobility: Secondary | ICD-10-CM

## 2014-12-11 DIAGNOSIS — R32 Unspecified urinary incontinence: Secondary | ICD-10-CM | POA: Diagnosis not present

## 2014-12-11 DIAGNOSIS — B029 Zoster without complications: Secondary | ICD-10-CM | POA: Insufficient documentation

## 2014-12-11 DIAGNOSIS — Z96652 Presence of left artificial knee joint: Secondary | ICD-10-CM

## 2014-12-11 DIAGNOSIS — M1712 Unilateral primary osteoarthritis, left knee: Secondary | ICD-10-CM

## 2014-12-11 DIAGNOSIS — M4802 Spinal stenosis, cervical region: Secondary | ICD-10-CM | POA: Diagnosis not present

## 2014-12-11 DIAGNOSIS — F332 Major depressive disorder, recurrent severe without psychotic features: Secondary | ICD-10-CM | POA: Diagnosis not present

## 2014-12-11 NOTE — Progress Notes (Signed)
[name], female   DOB: 09/20/1965, 49 y.o.   MRN: [id number]    DATE: 12/11/14  Location:  [facility]    Place of Service: SNF [id number])   Extended Emergency Contact Information Primary Emergency Contact: Phipps,Sheila          [place] Phone: [phone] Mobile Phone: [phone] Relation: Friend Secondary Emergency Contact: Tonette Lederer, [place] [address] Johnnette Litter of [place] Phone: [phone] Relation: Daughter   Chief Complaint  Patient presents with  . Discharge Note    HPI:  49 yo femle seen today for d/c from SNF following short term rehab s/p left TKR with hx OA. She has completed rehab and is medically stable to be discharged to home with DME (rolling walker, 3-in-1 commode, tub/shower bench, reacher) and HH PT/OT. She needs gait and ADL training. She noticed some d/c from incision yesterday after massaging joint. She applied band aid and has not noticed anymore d/c  Past Medical History  Diagnosis Date  . Sinus infection   . Hypertension   . Arthritis   . Spinal stenosis   . Glaucoma   . Carpal tunnel syndrome   . Depression   . Bipolar 2 disorder   . Obesity   . Arthritis   . Glaucoma   . Leiomyoma of uterus, unspecified   . Sleep apnea   . MS (multiple sclerosis)   . Migraine   . Spinal stenosis in cervical region 09/07/2014    C5-6 level  . GERD (gastroesophageal reflux disease)     Past Surgical History  Procedure Laterality Date  . Carpal tunnel release Bilateral   . Abdominal hysterectomy    . Knee surgery Left     arthroscopic  . Myelogram    . Cesarean section    . Breast reduction surgery    . Total knee arthroplasty Left 11/23/2014    Procedure: LEFT TOTAL KNEE ARTHROPLASTY;  Surgeon: Mcarthur Rossetti, MD;  Location: [facility];  Service: Orthopedics;  Laterality: Left;    Patient Care Team: Antonietta Jewel, MD as PCP - General (Internal  Medicine)  History   Social History  . Marital Status: Divorced    Spouse Name: Ilona Sorrel   . Number of Children: 3  . Years of Education: bachelor's   Occupational History  .     Social History Main Topics  . Smoking status: Never Smoker   . Smokeless tobacco: Never Used  . Alcohol Use: Yes     Comment: socially, red wine  . Drug Use: No  . Sexual Activity:    Partners: Male    Birth Control/ Protection: Surgical   Other Topics Concern  . Not on file   Social History Narrative   Patient is married to [employer].    Patient has 3 children.    Patient denies caffeine.       Patient is right handed.   Patient does not drink caffeine.        reports that she has never smoked. She has never used smokeless tobacco. She reports that she drinks alcohol. She reports that she does not use illicit drugs.   There is no immunization history on file for this patient.  Allergies  Allergen Reactions  . Ibuprofen Nausea And Vomiting    Due to gird    Medications: Patient's Medications  New Prescriptions   No medications on file  Previous Medications   AMPHETAMINE-DEXTROAMPHETAMINE (ADDERALL XR) 30 MG 24 HR CAPSULE    Take 30 mg by mouth daily.   AZITHROMYCIN (ZITHROMAX Z-PAK) 250 MG TABLET    Take 2 tablets po load, then 1 tablet po daily.   BRIMONIDINE-TIMOLOL (COMBIGAN) 0.2-0.5 % OPHTHALMIC SOLUTION    Place 1 drop into both eyes 2 (two) times daily.   CLOTRIMAZOLE (LOTRIMIN) 1 % CREAM    Apply 1 application topically 2 (two) times daily.   DIAZEPAM (VALIUM) 10 MG TABLET    Take 10 mg by mouth daily as needed for anxiety (before MRI procedure).    DICLOFENAC SODIUM (VOLTAREN) 1 % GEL    Apply 2 g topically daily as needed. For knee and foot pain   DORZOLAMIDE (TRUSOPT) 2 % OPHTHALMIC SOLUTION    Place 1 drop into both eyes 2 (two) times daily.    FLUOXETINE (PROZAC) 20 MG CAPSULE    Take 20 mg by mouth daily.   FLUOXETINE (PROZAC) 40 MG CAPSULE    Take 1 capsule (40 mg  total) by mouth daily.   FLUTICASONE (FLONASE) 50 MCG/ACT NASAL SPRAY    Place 1 spray into both nostrils daily as needed for allergies or rhinitis.    FUROSEMIDE (LASIX) 40 MG TABLET    Take 40 mg by mouth daily.   GABAPENTIN (NEURONTIN) 600 MG TABLET    Take 600 mg by mouth 3 (three) times daily.   LAMOTRIGINE (LAMICTAL) 200 MG TABLET    Take 200 mg by mouth daily.   LISINOPRIL (PRINIVIL,ZESTRIL) 40 MG TABLET    Take 1 tablet (40 mg total) by mouth daily.   LORAZEPAM (ATIVAN) 0.5 MG TABLET    Take 0.5 mg by mouth 3 (three) times daily.    METHOCARBAMOL (ROBAXIN) 500 MG TABLET    Take 2 tablets (1,000 mg total) by mouth 4 (four) times daily as needed (Pain).   METHOCARBAMOL (ROBAXIN) 500 MG TABLET    Take 1 tablet (500 mg total) by mouth every 6 (six) hours as needed for muscle spasms.   METRONIDAZOLE (FLAGYL) 500 MG TABLET    Take 1 tablet (500 mg total) by mouth 2 (two) times daily.   OMEPRAZOLE (PRILOSEC) 40 MG CAPSULE    Take 1 capsule (40 mg total) by mouth daily.   ONDANSETRON (ZOFRAN) 4 MG TABLET    Take 1 tablet (4 mg total) by mouth every 8 (eight) hours as needed for nausea or vomiting.   OXYCODONE (OXY IR/ROXICODONE) 5 MG IMMEDIATE RELEASE TABLET    Take one to three tablets by mouth every 3 hours as needed for pain   OXYCODONE (OXYCONTIN) 20 MG T12A 12 HR TABLET    Take 2 tablets (40 mg total) by mouth every 12 (twelve) hours.   RIVAROXABAN (XARELTO) 10 MG TABS TABLET    Take 1 tablet (10 mg total) by mouth daily with breakfast.   TOPIRAMATE (TOPAMAX) 100 MG TABLET    Take 1 tablet (100 mg total) by mouth daily.   TRIAMCINOLONE CREAM (KENALOG) 0.1 %    Apply 1 application topically daily as needed (skin irritations).    VALACYCLOVIR (VALTREX) 500 MG TABLET    1 po bid x 3 days prn.   VITAMIN D, ERGOCALCIFEROL, (DRISDOL) 50000 UNITS CAPS CAPSULE    Take 50,000 Units by mouth every 7 (seven) days. Sunday   ZOLPIDEM (AMBIEN) 10 MG TABLET    Take 10 mg by mouth at bedtime.  Modified  Medications   No medications  on file  Discontinued Medications   No medications on file    Review of Systems  Unable to perform ROS: Psychiatric disorder    Filed Vitals:   12/11/14 1607  BP: 110/65  Pulse: 90  Temp: 98.4 F (36.9 C)  Weight: 326 lb (147.873 kg)  SpO2: 96%   Body mass index is 51.05 kg/(m^2).  Physical Exam  Constitutional: She appears well-developed and well-nourished. No distress.  Musculoskeletal: She exhibits edema and tenderness.  Left knee incision without purulent d/c, dehescence, redness or new swelling. Staples absent (removed by Ortho)  Neurological: She is alert.  Skin: Skin is warm and dry. No rash noted.  Psychiatric: Her behavior is normal. Her mood appears anxious. Her speech is rapid and/or pressured.     Labs reviewed: Admission on 11/23/2014, Discharged on 11/26/2014  Component Date Value Ref Range Status  . ABO/RH(D) 11/23/2014 O POS   Final  . Antibody Screen 11/23/2014 NEG   Final  . Sample Expiration 11/23/2014 11/26/2014   Final  . ABO/RH(D) 11/23/2014 O POS   Final  . WBC 11/24/2014 6.5  4.0 - 10.5 K/uL Final  . RBC 11/24/2014 3.70* 3.87 - 5.11 MIL/uL Final  . Hemoglobin 11/24/2014 11.4* 12.0 - 15.0 g/dL Final  . HCT 11/24/2014 34.9* 36.0 - 46.0 % Final  . MCV 11/24/2014 94.3  78.0 - 100.0 fL Final  . MCH 11/24/2014 30.8  26.0 - 34.0 pg Final  . MCHC 11/24/2014 32.7  30.0 - 36.0 g/dL Final  . RDW 11/24/2014 14.1  11.5 - 15.5 % Final  . Platelets 11/24/2014 237  150 - 400 K/uL Final  . Sodium 11/24/2014 134* 135 - 145 mmol/L Final  . Potassium 11/24/2014 3.5  3.5 - 5.1 mmol/L Final  . Chloride 11/24/2014 104  101 - 111 mmol/L Final  . CO2 11/24/2014 25  22 - 32 mmol/L Final  . Glucose, Bld 11/24/2014 130* 65 - 99 mg/dL Final  . BUN 11/24/2014 13  6 - 20 mg/dL Final  . Creatinine, Ser 11/24/2014 0.85  0.44 - 1.00 mg/dL Final  . Calcium 11/24/2014 8.8* 8.9 - 10.3 mg/dL Final  . GFR calc non Af Amer 11/24/2014 >60  >60 mL/min  Final  . GFR calc Af Amer 11/24/2014 >60  >60 mL/min Final   Comment: (NOTE) The eGFR has been calculated using the CKD EPI equation. This calculation has not been validated in all clinical situations. eGFR's persistently <60 mL/min signify possible Chronic Kidney Disease.   . Anion gap 11/24/2014 5  5 - 15 Final  . WBC 11/25/2014 7.0  4.0 - 10.5 K/uL Final  . RBC 11/25/2014 3.31* 3.87 - 5.11 MIL/uL Final  . Hemoglobin 11/25/2014 9.8* 12.0 - 15.0 g/dL Final  . HCT 11/25/2014 30.7* 36.0 - 46.0 % Final  . MCV 11/25/2014 92.7  78.0 - 100.0 fL Final  . MCH 11/25/2014 29.6  26.0 - 34.0 pg Final  . MCHC 11/25/2014 31.9  30.0 - 36.0 g/dL Final  . RDW 11/25/2014 13.7  11.5 - 15.5 % Final  . Platelets 11/25/2014 193  150 - 400 K/uL Final  . WBC 11/26/2014 8.2  4.0 - 10.5 K/uL Final  . RBC 11/26/2014 3.15* 3.87 - 5.11 MIL/uL Final  . Hemoglobin 11/26/2014 9.4* 12.0 - 15.0 g/dL Final  . HCT 11/26/2014 29.3* 36.0 - 46.0 % Final  . MCV 11/26/2014 93.0  78.0 - 100.0 fL Final  . MCH 11/26/2014 29.8  26.0 - 34.0 pg Final  . MCHC  11/26/2014 32.1  30.0 - 36.0 g/dL Final  . RDW 11/26/2014 13.7  11.5 - 15.5 % Final  . Platelets 11/26/2014 182  150 - 400 K/uL Final  Hospital Outpatient Visit on 11/16/2014  Component Date Value Ref Range Status  . Prothrombin Time 11/16/2014 13.6  11.6 - 15.2 seconds Final  . INR 11/16/2014 1.02  0.00 - 1.49 Final  . aPTT 11/16/2014 31  24 - 37 seconds Final  . MRSA, PCR 11/16/2014 NEGATIVE  NEGATIVE Final  . Staphylococcus aureus 11/16/2014 NEGATIVE  NEGATIVE Final   Comment:        The Xpert SA Assay (FDA approved for NASAL specimens in patients over 54 years of age), is one component of a comprehensive surveillance program.  Test performance has been validated by [facility] for patients greater than or equal to 31 year old. It is not intended to diagnose infection nor to guide or monitor treatment.   . Sodium 11/16/2014 138  135 - 145 mmol/L Final  .  Potassium 11/16/2014 4.1  3.5 - 5.1 mmol/L Final  . Chloride 11/16/2014 108  101 - 111 mmol/L Final  . CO2 11/16/2014 26  22 - 32 mmol/L Final  . Glucose, Bld 11/16/2014 95  65 - 99 mg/dL Final  . BUN 11/16/2014 16  6 - 20 mg/dL Final  . Creatinine, Ser 11/16/2014 1.19* 0.44 - 1.00 mg/dL Final  . Calcium 11/16/2014 9.1  8.9 - 10.3 mg/dL Final  . GFR calc non Af Amer 11/16/2014 53* >60 mL/min Final  . GFR calc Af Amer 11/16/2014 >60  >60 mL/min Final   Comment: (NOTE) The eGFR has been calculated using the CKD EPI equation. This calculation has not been validated in all clinical situations. eGFR's persistently <60 mL/min signify possible Chronic Kidney Disease.   . Anion gap 11/16/2014 4* 5 - 15 Final  . WBC 11/16/2014 3.7* 4.0 - 10.5 K/uL Final  . RBC 11/16/2014 3.91  3.87 - 5.11 MIL/uL Final  . Hemoglobin 11/16/2014 11.8* 12.0 - 15.0 g/dL Final  . HCT 11/16/2014 36.6  36.0 - 46.0 % Final  . MCV 11/16/2014 93.6  78.0 - 100.0 fL Final  . MCH 11/16/2014 30.2  26.0 - 34.0 pg Final  . MCHC 11/16/2014 32.2  30.0 - 36.0 g/dL Final  . RDW 11/16/2014 14.1  11.5 - 15.5 % Final  . Platelets 11/16/2014 256  150 - 400 K/uL Final  Office Visit on 10/26/2014  Component Date Value Ref Range Status  . HIV 1&2 Ab, 4th Generation 10/26/2014 NONREACTIVE  NONREACTIVE Final   Comment:   HIV-1 antigen and HIV-1/HIV-2 antibodies were not detected.  There is no laboratory evidence of HIV infection.   HIV-1/2 Antibody Diff        Not indicated. HIV-1 RNA, Qual TMA          Not indicated.     PLEASE NOTE: This information has been disclosed to you from records whose confidentiality may be protected by state law. If your state requires such protection, then the state law prohibits you from making any further disclosure of the information without the specific written consent of the person to whom it pertains, or as otherwise permitted by law. A general authorization for the release of medical or  other information is NOT sufficient for this purpose.   The performance of this assay has not been clinically validated in patients less than 63 years old.   For additional information please refer to http://education.questdiagnostics.com/faq/FAQ106.  (This  link is being provided for informational/educational purposes only.)     . Hepatitis B Surface Ag 10/26/2014 NEGATIVE  NEGATIVE Final  . RPR Ser Ql 10/26/2014 NON REAC  NON REAC Final  . HCV Ab 10/26/2014 NEGATIVE  NEGATIVE Final  . Colony Count 10/26/2014 30,000 COLONIES/ML   Final  . Organism ID, Bacteria 10/26/2014 Multiple bacterial morphotypes present, none   Final  . Organism ID, Bacteria 10/26/2014 predominant. Suggest appropriate recollection if    Final  . Organism ID, Bacteria 10/26/2014 clinically indicated.   Final  . BV CATEGORY 10/26/2014 REPORT   Final   Comment: BV Category                NOT SUPPORTIVE                      Reference range:  NOT SUPPORTIVE   . LACTOBACILLUS SPECIES 10/26/2014 7.3   Final  . Atopobium vaginae 10/26/2014 Not Detected   Final  . MEGASPHAERA SPECIES 10/26/2014 Not Detected   Final  . Gardnerella vaginalis 10/26/2014 Not Detected   Final   Comment: NOT SUPPORTIVE OF BV: The pattern of results is not supportive of a diagnosis of BV: 1)Presence of Lactobacillus spp., G. vaginalis levels less than 6.0 log cells/mL, and absence of A. vaginae and Megasphaera spp; or 2)Absence of all targeted organisms; or 3)Absence of Lactobacillus spp.  plus G. vaginalis detected at levels less than 6.0 log cells/mL and absence of A. vaginae and Megasphaera spp. EQUIVOCAL FOR BV: The pattern of results is neither supportive nor not supportive of a diagnosis of BV. The patient may be in transition into or out of BV: Presence of Lactobacillus spp. plus G. vaginalis (greater or equal to 6.0 log cells/mL) and/or one of the other BV-associated pathogens. SUPPORTIVE OF BV: The pattern of results is  supportive of a diagnosis of BV: Absence of Lactobacillus spp. and presence of G. vaginalis greater than or equal to 6.0 log cells/mL and/or one or both of the other BV-associated pathogens. Concentration for Lactobacilli (L. acidophilus/ cri                          spatus, L. jensenii) are collectively reported under the term "Lactobacillus spp.", as these species are among the peroxide producing Lactobacilli thought to be protective against bacterial vaginosis. Atopobium vaginae, Megasphaera spp., and Gardnerella (greater than 6.0 log cells/mL) have been associated with vaginosis when present in the absence of peroxide producing Lactobacilli. This test was developed and its analytical performance characteristics have been determined by [employer], [place], [place]. It has not been cleared or approved by the FDA. This assay has been validated pursuant to the CLIA regulations and is used for clinical purposes.   . T. vaginalis RNA, QL TMA 10/26/2014 Not Detected  Not Detected Final   Comment: This test was performed using the APTIMA Trichomonas vaginalis Assay (Gen-Probe). For more information on this test, go to http://education.questdiagnostics.com/faq/ Trichomonastma   . C. albicans, DNA 10/26/2014 Not Detected  Not Detected Final  . C. glabrata, DNA 10/26/2014 Not Detected  Not Detected Final  . C. tropicalis, DNA 10/26/2014 Not Detected  Not Detected Final  . C. parapsilosis, DNA 10/26/2014 Not Detected  Not Detected Final   Comment: This test was developed and its analytical performance characteristics have been determined by [employer], [place], [place]. It has not been cleared or approved by the FDA. This  assay has been validated pursuant to the CLIA regulations and is used for clinical purposes.   . Color, UA 10/26/2014 yellow   Final  . Clarity, UA 10/26/2014 clear   Final  . Glucose, UA 10/26/2014 negative   Final  .  Bilirubin, UA 10/26/2014 negative   Final  . Ketones, UA 10/26/2014 negaitve   Final  . Spec Grav, UA 10/26/2014 1.015   Final  . Blood, UA 10/26/2014 trace   Final  . pH, UA 10/26/2014 5.0   Final  . Protein, UA 10/26/2014 negative   Final  . Urobilinogen, UA 10/26/2014 negative   Final  . Nitrite, UA 10/26/2014 negative   Final  . Leukocytes, UA 10/26/2014 Negative  Negative Final    Dg Knee Left Port  11/23/2014   CLINICAL DATA:  Status post left total knee replacement today.  EXAM: PORTABLE LEFT KNEE - 1-2 VIEW  COMPARISON:  Plain films right knee 10/27/2014.  FINDINGS: Left total knee arthroplasty is in place. The device is located and there is no fracture. Gas in the soft tissues and surgical staples are noted.  IMPRESSION: Left total knee replacement bones complication.   Electronically Signed   By: Inge Rise M.D.   On: 11/23/2014 18:01     Assessment/Plan   ICD-9-CM ICD-10-CM   1. Status post total left knee replacement V43.65 Z96.652   2. Primary osteoarthritis of left knee 715.16 M17.12   3. Abnormality of gait 781.2 R26.9   4. Shingles 053.9 B02.9   5. Major depressive disorder, recurrent, severe without psychotic features 296.33 F33.2   6. Urinary incontinence, unspecified incontinence type 788.30 R32   7. Spinal stenosis in cervical region 723.0 M48.02    --she will complete valtrex tx prior to her d/c  --f/u with Ortho as scheduled  --Patient is being discharged with home health services:  PT for gait training; OT for ADL training  --Patient is being discharged with the following durable medical equipment: rolling walker, 3-in-1 commode, tub/shower bench, reacher   --Patient has been advised to f/u with their PCP in 1-2 weeks to bring them up to date on their rehab stay.  They were provided with a 30 day supply of scripts for prescription medications and refills must be obtained from their PCP.  TIME SPENT (MINUTES): [place]. Perlie Gold  [facility] [address] [place], [place] [address] [phone] Cell (Monday-Friday 8 AM - 5 PM) [phone] After 5 PM and follow prompts

## 2015-02-11 ENCOUNTER — Encounter (HOSPITAL_COMMUNITY): Payer: Self-pay | Admitting: Emergency Medicine

## 2015-02-11 ENCOUNTER — Emergency Department (HOSPITAL_COMMUNITY)
Admission: EM | Admit: 2015-02-11 | Discharge: 2015-02-11 | Disposition: A | Discharge status: Home / Self Care | Payer: Medicare Other | Attending: Emergency Medicine | Admitting: Emergency Medicine

## 2015-02-11 DIAGNOSIS — Z7951 Long term (current) use of inhaled steroids: Secondary | ICD-10-CM | POA: Diagnosis not present

## 2015-02-11 DIAGNOSIS — Z79899 Other long term (current) drug therapy: Secondary | ICD-10-CM | POA: Insufficient documentation

## 2015-02-11 DIAGNOSIS — Z8709 Personal history of other diseases of the respiratory system: Secondary | ICD-10-CM | POA: Insufficient documentation

## 2015-02-11 DIAGNOSIS — M1990 Unspecified osteoarthritis, unspecified site: Secondary | ICD-10-CM | POA: Diagnosis not present

## 2015-02-11 DIAGNOSIS — E669 Obesity, unspecified: Secondary | ICD-10-CM | POA: Diagnosis not present

## 2015-02-11 DIAGNOSIS — R52 Pain, unspecified: Secondary | ICD-10-CM | POA: Diagnosis not present

## 2015-02-11 DIAGNOSIS — H409 Unspecified glaucoma: Secondary | ICD-10-CM | POA: Insufficient documentation

## 2015-02-11 DIAGNOSIS — F319 Bipolar disorder, unspecified: Secondary | ICD-10-CM | POA: Diagnosis not present

## 2015-02-11 DIAGNOSIS — K219 Gastro-esophageal reflux disease without esophagitis: Secondary | ICD-10-CM | POA: Diagnosis not present

## 2015-02-11 DIAGNOSIS — G5600 Carpal tunnel syndrome, unspecified upper limb: Secondary | ICD-10-CM | POA: Insufficient documentation

## 2015-02-11 DIAGNOSIS — Z86018 Personal history of other benign neoplasm: Secondary | ICD-10-CM | POA: Insufficient documentation

## 2015-02-11 DIAGNOSIS — G43909 Migraine, unspecified, not intractable, without status migrainosus: Secondary | ICD-10-CM | POA: Diagnosis not present

## 2015-02-11 DIAGNOSIS — G35 Multiple sclerosis: Secondary | ICD-10-CM | POA: Diagnosis not present

## 2015-02-11 DIAGNOSIS — I10 Essential (primary) hypertension: Secondary | ICD-10-CM | POA: Diagnosis not present

## 2015-02-11 LAB — BASIC METABOLIC PANEL
Anion gap: 7 (ref 5–15)
BUN: 15 mg/dL (ref 6–20)
CO2: 25 mmol/L (ref 22–32)
Calcium: 9.6 mg/dL (ref 8.9–10.3)
Chloride: 108 mmol/L (ref 101–111)
Creatinine, Ser: 0.75 mg/dL (ref 0.44–1.00)
GFR calc Af Amer: >60 mL/min (ref >60)
GFR calc non Af Amer: >60 mL/min (ref >60)
Glucose, Bld: 98 mg/dL (ref 65–99)
Potassium: 4.1 mmol/L (ref 3.5–5.1)
Sodium: 140 mmol/L (ref 135–145)

## 2015-02-11 LAB — URINALYSIS, ROUTINE W REFLEX MICROSCOPIC
Bilirubin Urine: NEGATIVE
Glucose, UA: NEGATIVE mg/dL
Hgb urine dipstick: NEGATIVE
Ketones, ur: NEGATIVE mg/dL
Leukocytes, UA: NEGATIVE
Nitrite: NEGATIVE
Protein, ur: NEGATIVE mg/dL
Specific Gravity, Urine: 1.023 (ref 1.005–1.030)
Urobilinogen, UA: 1 mg/dL (ref 0.0–1.0)
pH: 6 (ref 5.0–8.0)

## 2015-02-11 LAB — CBC WITH DIFFERENTIAL/PLATELET
Basophils Absolute: 0 10*3/uL (ref 0.0–0.1)
Basophils Relative: 0 %
Eosinophils Absolute: 0.1 10*3/uL (ref 0.0–0.7)
Eosinophils Relative: 2 %
HCT: 36.1 % (ref 36.0–46.0)
Hemoglobin: 11.7 g/dL — ABNORMAL LOW (ref 12.0–15.0)
Lymphocytes Relative: 55 %
Lymphs Abs: 2.6 10*3/uL (ref 0.7–4.0)
MCH: 30.1 pg (ref 26.0–34.0)
MCHC: 32.4 g/dL (ref 30.0–36.0)
MCV: 92.8 fL (ref 78.0–100.0)
Monocytes Absolute: 0.3 10*3/uL (ref 0.1–1.0)
Monocytes Relative: 6 %
Neutro Abs: 1.7 10*3/uL (ref 1.7–7.7)
Neutrophils Relative %: 37 %
Platelets: 249 10*3/uL (ref 150–400)
RBC: 3.89 MIL/uL (ref 3.87–5.11)
RDW: 14.2 % (ref 11.5–15.5)
WBC: 4.6 10*3/uL (ref 4.0–10.5)

## 2015-02-11 MED ORDER — SODIUM CHLORIDE 0.9 % IV BOLUS (SEPSIS)
1000.0000 mL | Freq: Once | INTRAVENOUS | Status: AC
  Administered 2015-02-11: 1000 mL via INTRAVENOUS

## 2015-02-11 MED ORDER — HYDROMORPHONE HCL 1 MG/ML IJ SOLN
1.0000 mg | Freq: Once | INTRAMUSCULAR | Status: AC
  Administered 2015-02-11: 1 mg via INTRAVENOUS
  Filled 2015-02-11: qty 1

## 2015-02-11 MED ORDER — MORPHINE SULFATE (PF) 4 MG/ML IV SOLN
4.0000 mg | Freq: Once | INTRAVENOUS | Status: AC
  Administered 2015-02-11: 4 mg via INTRAVENOUS
  Filled 2015-02-11: qty 1

## 2015-02-11 MED ORDER — METHOCARBAMOL 500 MG PO TABS: 1000.0000 mg | ORAL_TABLET | Freq: Four times a day (QID) | ORAL | Status: DC

## 2015-02-11 MED ORDER — PREDNISONE 50 MG PO TABS: 50.0000 mg | ORAL_TABLET | Freq: Every day | ORAL | Status: DC

## 2015-02-11 MED ORDER — OXYCODONE-ACETAMINOPHEN 5-325 MG PO TABS: 1.0000 | ORAL_TABLET | Freq: Four times a day (QID) | ORAL | Status: DC | PRN

## 2015-02-11 NOTE — ED Notes (Signed)
Pt ambulated to the restroom in attempt to collect a urine sample.

## 2015-02-11 NOTE — ED Notes (Signed)
Per patient, states body aches for the last 48 hours-wants to find out why she is having all this pain

## 2015-02-11 NOTE — ED Notes (Signed)
Delay in lab draw, PA in with pt. 

## 2015-02-11 NOTE — ED Provider Notes (Signed)
CSN: [id number]     Arrival date & time 02/11/15  1357 History   First MD Initiated Contact with Patient 02/11/15 1826     Chief Complaint  Patient presents with  . Generalized Body Aches     (Consider location/radiation/quality/duration/timing/severity/associated sxs/prior Treatment) HPI Patient presents to the emergency department with generalized body spasms and aches.  Patient states she has this every 3-4 months where she has a flareup of these symptoms.  Patient, states she has been seen many times for this and is being followed by neurology as well.  She states that she feels like she has MS, but he is taking that into consideration.  Patient states that nothing seems make her condition better or worse.  She states steroids to seem to help somewhat.  She denies chest pain, shortness of breath, weakness, dizziness, headache, blurred vision, pain, neck pain, fever, dysuria, incontinence, hematemesis, bloody stool, rash, or syncope.  He should states she only takes her home medications for this Past Medical History  Diagnosis Date  . Sinus infection   . Hypertension   . Arthritis   . Spinal stenosis   . Glaucoma   . Carpal tunnel syndrome   . Depression   . Bipolar 2 disorder ([place])   . Obesity   . Arthritis   . Glaucoma   . Leiomyoma of uterus, unspecified   . Sleep apnea   . MS (multiple sclerosis) ([place])   . Migraine   . Spinal stenosis in cervical region 09/07/2014    C5-6 level  . GERD (gastroesophageal reflux disease)    Past Surgical History  Procedure Laterality Date  . Carpal tunnel release Bilateral   . Abdominal hysterectomy    . Knee surgery Left     arthroscopic  . Myelogram    . Cesarean section    . Breast reduction surgery    . Total knee arthroplasty Left 11/23/2014    Procedure: LEFT TOTAL KNEE ARTHROPLASTY;  Surgeon: Mcarthur Rossetti, MD;  Location: [facility];  Service: Orthopedics;  Laterality: Left;   Family History  Problem Relation Age of Onset   . Hypertension    . Hypertension Father   . Hyperlipidemia Father   . Fibromyalgia Mother   . Diabetes Mother   . Healthy Sister   . Healthy Brother    Social History  Substance Use Topics  . Smoking status: Never Smoker   . Smokeless tobacco: Never Used  . Alcohol Use: Yes     Comment: socially, red wine   OB History    Gravida Para Term Preterm AB TAB SAB Ectopic Multiple Living   3 3 3       3      Review of Systems  All other systems negative except as documented in the HPI. All pertinent positives and negatives as reviewed in the HPI.   Allergies  Ibuprofen  Home Medications   Prior to Admission medications   Medication Sig Start Date End Date Taking? Authorizing Provider  amphetamine-dextroamphetamine (ADDERALL XR) 30 MG 24 hr capsule Take 30 mg by mouth daily.   Yes Historical Provider, MD  brimonidine-timolol (COMBIGAN) 0.2-0.5 % ophthalmic solution Place 1 drop into both eyes 2 (two) times daily.   Yes Historical Provider, MD  clotrimazole (LOTRIMIN) 1 % cream Apply 1 application topically 2 (two) times daily. Patient taking differently: Apply 1 application topically 2 (two) times daily as needed (skin irritations).  04/02/14  Yes Shelly Bombard, MD  diazepam (VALIUM) 10 MG  tablet Take 10 mg by mouth daily as needed for anxiety (before MRI procedure).  07/10/14  Yes Historical Provider, MD  diclofenac sodium (VOLTAREN) 1 % GEL Apply 2 g topically daily as needed. For knee and foot pain   Yes Historical Provider, MD  dorzolamide (TRUSOPT) 2 % ophthalmic solution Place 1 drop into both eyes 2 (two) times daily.    Yes Historical Provider, MD  FLUoxetine (PROZAC) 20 MG capsule Take 20 mg by mouth daily.   Yes Historical Provider, MD  fluticasone (FLONASE) 50 MCG/ACT nasal spray Place 1 spray into both nostrils daily as needed for allergies or rhinitis.  06/06/14  Yes Historical Provider, MD  furosemide (LASIX) 40 MG tablet Take 40 mg by mouth daily as needed for fluid or  edema.    Yes Historical Provider, MD  gabapentin (NEURONTIN) 600 MG tablet Take 600 mg by mouth 3 (three) times daily.   Yes Historical Provider, MD  lamoTRIgine (LAMICTAL) 200 MG tablet Take 200 mg by mouth daily.   Yes Historical Provider, MD  lisinopril (PRINIVIL,ZESTRIL) 40 MG tablet Take 1 tablet (40 mg total) by mouth daily. 12/05/12  Yes Milta Deiters T Mashburn, PA-C  LORazepam (ATIVAN) 0.5 MG tablet Take 0.5 mg by mouth 3 (three) times daily as needed for anxiety.  12/05/12  Yes Milta Deiters T Mashburn, PA-C  omeprazole (PRILOSEC) 40 MG capsule Take 1 capsule (40 mg total) by mouth daily. 10/26/14  Yes Shelly Bombard, MD  ondansetron (ZOFRAN) 4 MG tablet Take 1 tablet (4 mg total) by mouth every 8 (eight) hours as needed for nausea or vomiting. 10/26/14  Yes Shelly Bombard, MD  oxyCODONE-acetaminophen (PERCOCET) 10-325 MG tablet Take 1 tablet by mouth 3 (three) times daily as needed for pain.  01/16/15  Yes Historical Provider, MD  topiramate (TOPAMAX) 100 MG tablet Take 1 tablet (100 mg total) by mouth daily. Patient taking differently: Take 200 mg by mouth daily.  11/24/14  Yes Kathrynn Ducking, MD  triamcinolone cream (KENALOG) 0.1 % Apply 1 application topically daily as needed (skin irritations).  07/19/14  Yes Historical Provider, MD  valACYclovir (VALTREX) 500 MG tablet 1 po bid x 3 days prn. Patient taking differently: Take 500 mg by mouth daily as needed (outbreak).  08/15/14  Yes Shelly Bombard, MD  Vitamin D, Ergocalciferol, (DRISDOL) 50000 UNITS CAPS capsule Take 50,000 Units by mouth every 7 (seven) days. Sunday   Yes Historical Provider, MD  XIIDRA 5 % SOLN Place 1 drop into both eyes daily. 01/31/15  Yes Historical Provider, MD  zolpidem (AMBIEN) 10 MG tablet Take 10 mg by mouth at bedtime.   Yes Historical Provider, MD  azithromycin (ZITHROMAX Z-PAK) 250 MG tablet Take 2 tablets po load, then 1 tablet po daily. Patient not taking: Reported on 11/16/2014 10/26/14   Shelly Bombard, MD  FLUoxetine  (PROZAC) 40 MG capsule Take 1 capsule (40 mg total) by mouth daily. Patient not taking: Reported on 10/26/2014 12/05/12   Ruben Im, PA-C  methocarbamol (ROBAXIN) 500 MG tablet Take 2 tablets (1,000 mg total) by mouth 4 (four) times daily as needed (Pain). Patient not taking: Reported on 10/26/2014 04/19/14   Elmyra Ricks Pisciotta, PA-C  methocarbamol (ROBAXIN) 500 MG tablet Take 1 tablet (500 mg total) by mouth every 6 (six) hours as needed for muscle spasms. Patient not taking: Reported on 02/11/2015 11/26/14   Mcarthur Rossetti, MD  oxyCODONE (OXY IR/ROXICODONE) 5 MG immediate release tablet Take one to three tablets by  mouth every 3 hours as needed for pain Patient not taking: Reported on 02/11/2015 12/10/14   Tiffany L Reed, DO  OxyCODONE (OXYCONTIN) 20 mg T12A 12 hr tablet Take 2 tablets (40 mg total) by mouth every 12 (twelve) hours. Patient not taking: Reported on 02/11/2015 11/26/14   Mcarthur Rossetti, MD  rivaroxaban (XARELTO) 10 MG TABS tablet Take 1 tablet (10 mg total) by mouth daily with breakfast. Patient not taking: Reported on 02/11/2015 11/26/14   Mcarthur Rossetti, MD   BP 119/52 mmHg  Pulse 88  Temp(Src) 98.7 F (37.1 C) (Oral)  Resp 18  SpO2 100% Physical Exam  Constitutional: She is oriented to person, place, and time. She appears well-developed and well-nourished. No distress.  HENT:  Head: Normocephalic and atraumatic.  Mouth/Throat: Oropharynx is clear and moist.  Eyes: Pupils are equal, round, and reactive to light.  Neck: Normal range of motion. Neck supple.  Cardiovascular: Normal rate, regular rhythm and normal heart sounds.  Exam reveals no gallop and no friction rub.   No murmur heard. Pulmonary/Chest: Effort normal and breath sounds normal. No respiratory distress. She has no wheezes.  Abdominal: Soft. Bowel sounds are normal. She exhibits no distension. There is no tenderness.  Neurological: She is alert and oriented to person, place, and time.  She exhibits normal muscle tone. Coordination normal.  Skin: Skin is warm and dry. No rash noted. No erythema.  Psychiatric: She has a normal mood and affect. Her behavior is normal.  Nursing note and vitals reviewed.   ED Course  Procedures (including critical care time) Labs Review Labs Reviewed  URINALYSIS, ROUTINE W REFLEX MICROSCOPIC (NOT AT [facility]) - Abnormal; Notable for the following:    APPearance HAZY (*)    All other components within normal limits  CBC WITH DIFFERENTIAL/PLATELET - Abnormal; Notable for the following:    Hemoglobin 11.7 (*)    All other components within normal limits  BASIC METABOLIC PANEL    Imaging Review No results found. I have personally reviewed and evaluated these images and lab results as part of my medical decision-making. Patient is referred back to her primary doctor and neurologist for further evaluation and care.  Patient does not have any neurological deficits noted on exam.  She has normal strength and ambulates without difficulty.  Patient agrees the plan and all questions were answered  Dalia Heading, PA-C 02/12/15 2143  Charlesetta Shanks, MD 02/14/15 [phone]

## 2015-02-11 NOTE — Discharge Instructions (Signed)
Return here as needed.  Follow-up with the neurologist provided °

## 2015-02-11 NOTE — ED Notes (Signed)
Unable to collect labs at this time student PA talking with patient.

## 2015-03-11 ENCOUNTER — Ambulatory Visit: Payer: Medicaid Other | Admitting: Nurse Practitioner

## 2015-03-11 ENCOUNTER — Ambulatory Visit (INDEPENDENT_AMBULATORY_CARE_PROVIDER_SITE_OTHER): Payer: Medicare Other | Admitting: Nurse Practitioner

## 2015-03-11 ENCOUNTER — Telehealth: Payer: Self-pay | Admitting: *Deleted

## 2015-03-11 ENCOUNTER — Encounter: Payer: Self-pay | Admitting: Nurse Practitioner

## 2015-03-11 VITALS — BP 143/86 | HR 100 | Ht 67.0 in | Wt 331.6 lb

## 2015-03-11 DIAGNOSIS — M4802 Spinal stenosis, cervical region: Secondary | ICD-10-CM

## 2015-03-11 DIAGNOSIS — R269 Unspecified abnormalities of gait and mobility: Secondary | ICD-10-CM

## 2015-03-11 DIAGNOSIS — G441 Vascular headache, not elsewhere classified: Secondary | ICD-10-CM

## 2015-03-11 DIAGNOSIS — R413 Other amnesia: Secondary | ICD-10-CM | POA: Diagnosis not present

## 2015-03-11 NOTE — Telephone Encounter (Signed)
I called pt and told her that she had been scheduled for MRI C spine 11-21-14 and she no showed appt.  This was around the knee surgery.  I told her that she can reschedule and would give her the number.  She was driving at the moment.  I offered to schedule for her.  I called an she is scheduled Sunday 03-24-15 at 1810.  Needs authorization again. Pt has Medicare now.   Will also forward to MRI for preauthorization.

## 2015-03-11 NOTE — Progress Notes (Signed)
I have read the note, and I agree with the clinical assessment and plan.  Larhonda Dettloff KEITH   

## 2015-03-11 NOTE — Progress Notes (Signed)
[facility]  PATIENT: Brenda Odonnell DOB: 1966/01/07   REASON FOR VISIT: Gait abnormality, memory loss, spinal stenosis HISTORY FROM: Patient    HISTORY OF PRESENT ILLNESS: [name], 49 year old female returns for follow-up. She was last seen in the office by Dr. Jannifer Franklin 09/07/2014. She has a history of obstructive sleep apnea on CPAP, she claims she uses her CPAP 5 of 7 nights. She is morbidly obese, has cervical spondylosis, mild gait instability and left hemisensory deficit on exam. She had MRI of the brain which was normal. She also had ordered cervical spine MRI but she no showed for the appointment because of knee replacement surgery and being in rehabilitation. Her sedimentation rate at last visit was elevated and due to be repeated however it has not been. She also has a history of migraines and is on Topamax. She claims she has not fallen since her knee surgery.  She returns for reevaluation   HISTORY KWMs. Brenda Odonnell is a 49 year old right-handed black female with a history of obesity and a history of C5-6 cervical spondylosis. The patient was previously seen by Dr. Erling Cruz for evaluation of neck pain. The patient was referred to Dr. Joya Salm, and surgery was recommended, but the patient never had the surgery secondary to financial considerations. The patient has had ongoing issues over the last 2 years with fatigue. The patient believes that her balance has gradually worsened over time, and she uses a cane for ambulation. The patient will stumble, and occasionally fall. The patient has some pain with the left leg, she has a limping walk with the left leg, and she indicates that she may have some numbness down the leg and occasionally the leg will collapse on her. The patient has low back pain as well, and she has been set up for MRI evaluation of the low back. This study has not yet been done. The patient reports that she has a history of migraine headaches that occur 2 or 3  times a month, but she has another type of headache behind the eyes that is different from her usual migraine. This headache occurs on average 4 times a week lasting hours to all day long. The patient has no photophobia or phonophobia or nausea or vomiting with this headache. The patient may have some ringing in the ears. She does have photophobia and nausea with her migraine. The patient has some left face, arm, and leg numbness. The patient reports some issues with memory and concentration that has also declined over the last 2 years. The patient reports some urinary urgency, no incontinence. The patient denies any bowel control issues. She is sent to this office for further evaluation. She has a history of sleep apnea, currently on CPAP.   REVIEW OF SYSTEMS: Full 14 system review of systems performed and notable only for those listed, all others are neg:  Constitutional: Fatigue Cardiovascular: neg Ear/Nose/Throat: neg  Skin: neg Eyes: neg Respiratory: neg Gastroitestinal: Urgency Hematology/Lymphatic: neg  Endocrine: Intolerance to heat Musculoskeletal: Joint pain and back pain walking difficulty neck pain Stiffness Allergy/Immunology: neg Neurological: Memory loss numbness Psychiatric: Depression and anxiety Sleep : neg   ALLERGIES: Allergies  Allergen Reactions  . Ibuprofen Nausea And Vomiting    Due to gird    HOME MEDICATIONS: Outpatient Prescriptions Prior to Visit  Medication Sig Dispense Refill  . amphetamine-dextroamphetamine (ADDERALL XR) 30 MG 24 hr capsule Take 30 mg by mouth daily.    . brimonidine-timolol (COMBIGAN) 0.2-0.5 % ophthalmic solution Place 1  drop into both eyes 2 (two) times daily.    . clotrimazole (LOTRIMIN) 1 % cream Apply 1 application topically 2 (two) times daily. (Patient taking differently: Apply 1 application topically 2 (two) times daily as needed (skin irritations). ) 113 g 2  . diclofenac sodium (VOLTAREN) 1 % GEL Apply 2 g topically daily as  needed. For knee and foot pain    . dorzolamide (TRUSOPT) 2 % ophthalmic solution Place 1 drop into both eyes 2 (two) times daily.     Marland Kitchen FLUoxetine (PROZAC) 20 MG capsule Take 20 mg by mouth daily.    Marland Kitchen FLUoxetine (PROZAC) 40 MG capsule Take 1 capsule (40 mg total) by mouth daily. 30 capsule 0  . fluticasone (FLONASE) 50 MCG/ACT nasal spray Place 1 spray into both nostrils daily as needed for allergies or rhinitis.   0  . furosemide (LASIX) 40 MG tablet Take 40 mg by mouth daily as needed for fluid or edema.     Marland Kitchen lamoTRIgine (LAMICTAL) 200 MG tablet Take 200 mg by mouth daily.    Marland Kitchen lisinopril (PRINIVIL,ZESTRIL) 40 MG tablet Take 1 tablet (40 mg total) by mouth daily.    Marland Kitchen LORazepam (ATIVAN) 0.5 MG tablet Take 0.5 mg by mouth 3 (three) times daily as needed for anxiety.     . methocarbamol (ROBAXIN) 500 MG tablet Take 2 tablets (1,000 mg total) by mouth 4 (four) times daily. 40 tablet 0  . omeprazole (PRILOSEC) 40 MG capsule Take 1 capsule (40 mg total) by mouth daily. 30 capsule 11  . ondansetron (ZOFRAN) 4 MG tablet Take 1 tablet (4 mg total) by mouth every 8 (eight) hours as needed for nausea or vomiting. 30 tablet 2  . topiramate (TOPAMAX) 100 MG tablet Take 1 tablet (100 mg total) by mouth daily. (Patient taking differently: Take 200 mg by mouth daily. ) 90 tablet 1  . triamcinolone cream (KENALOG) 0.1 % Apply 1 application topically daily as needed (skin irritations).   0  . Vitamin D, Ergocalciferol, (DRISDOL) 50000 UNITS CAPS capsule Take 50,000 Units by mouth every 7 (seven) days. Sunday    . XIIDRA 5 % SOLN Place 1 drop into both eyes daily.  0  . zolpidem (AMBIEN) 10 MG tablet Take 10 mg by mouth at bedtime.    . predniSONE (DELTASONE) 50 MG tablet Take 1 tablet (50 mg total) by mouth daily. (Patient not taking: Reported on 03/11/2015) 5 tablet 0  . azithromycin (ZITHROMAX Z-PAK) 250 MG tablet Take 2 tablets po load, then 1 tablet po daily. (Patient not taking: Reported on 11/16/2014) 15  tablet 1  . diazepam (VALIUM) 10 MG tablet Take 10 mg by mouth daily as needed for anxiety (before MRI procedure).   0  . gabapentin (NEURONTIN) 600 MG tablet Take 600 mg by mouth 3 (three) times daily.    . rivaroxaban (XARELTO) 10 MG TABS tablet Take 1 tablet (10 mg total) by mouth daily with breakfast. (Patient not taking: Reported on 02/11/2015) 20 tablet 0  . valACYclovir (VALTREX) 500 MG tablet 1 po bid x 3 days prn. (Patient not taking: Reported on 03/11/2015) 30 tablet 11   No facility-administered medications prior to visit.    PAST MEDICAL HISTORY: Past Medical History  Diagnosis Date  . Sinus infection   . Hypertension   . Arthritis   . Spinal stenosis   . Glaucoma   . Carpal tunnel syndrome   . Depression   . Bipolar 2 disorder ([place])   .  Obesity   . Arthritis   . Glaucoma   . Leiomyoma of uterus, unspecified   . Sleep apnea   . MS (multiple sclerosis) ([place])   . Migraine   . Spinal stenosis in cervical region 09/07/2014    C5-6 level  . GERD (gastroesophageal reflux disease)     PAST SURGICAL HISTORY: Past Surgical History  Procedure Laterality Date  . Carpal tunnel release Bilateral   . Abdominal hysterectomy    . Knee surgery Left     arthroscopic  . Myelogram    . Cesarean section    . Breast reduction surgery    . Total knee arthroplasty Left 11/23/2014    Procedure: LEFT TOTAL KNEE ARTHROPLASTY;  Surgeon: Mcarthur Rossetti, MD;  Location: [facility];  Service: Orthopedics;  Laterality: Left;    FAMILY HISTORY: Family History  Problem Relation Age of Onset  . Hypertension    . Hypertension Father   . Hyperlipidemia Father   . Fibromyalgia Mother   . Diabetes Mother   . Healthy Sister   . Healthy Brother     SOCIAL HISTORY: Social History   Social History  . Marital Status: Divorced    Spouse Name: Ilona Sorrel   . Number of Children: 3  . Years of Education: bachelor's   Occupational History  .     Social History Main Topics  . Smoking  status: Never Smoker   . Smokeless tobacco: Never Used  . Alcohol Use: Yes     Comment: socially, red wine  . Drug Use: No  . Sexual Activity:    Partners: Male    Birth Control/ Protection: Surgical   Other Topics Concern  . Not on file   Social History Narrative   Patient is married to [employer].    Patient has 3 children.    Patient denies caffeine.       Patient is right handed.   Patient does not drink caffeine.        PHYSICAL EXAM  Filed Vitals:   03/11/15 1123  BP: 143/86  Pulse: 100  Height: 5\' 7"  (1.702 m)  Weight: 331 lb 9.6 oz (150.413 kg)   Body mass index is 51.92 kg/(m^2). General: The patient is alert and cooperative at the time of the examination. The patient is markedly obese. Neck: The neck is supple, no carotid bruits are noted. Respiratory: The respiratory examination is clear. Cardiovascular: The cardiovascular examination reveals a regular rate and rhythm, no obvious murmurs or rubs are noted. Skin: Extremities are without significant edema.  Neurologic Exam Mental status: The patient is alert and oriented x 3 at the time of the examination. The patient has apparent normal recent and remote memory, with an apparently normal attention span and concentration ability.  Cranial nerves: Facial symmetry is present. There is good sensation of the face to pinprick and soft touch on the right, decreased on the left. The patient does not split the midline with vibration sensation.. The strength of the facial muscles and the muscles to head turning and shoulder shrug are normal bilaterally. Speech is well enunciated, no aphasia or dysarthria is notedPupils are equal, round, and reactive to light. Discs are flat bilaterally.. Extraocular movements are full. Visual fields are full. The tongue is midline, and the patient has symmetric elevation of the soft palate. No obvious hearing deficits are noted. Motor: The motor testing reveals 5 over 5 strength of all  4 extremities. Good symmetric motor tone is noted throughout. Sensory: Sensory  testing is intact to pinprick, soft touch, vibration sensation, and position sense on all 4 extremities, with exception that there is some decrease in pinprick sensation on the left arm and leg, decreased vibration sensation on the left arm. No evidence of extinction is noted. Coordination: Cerebellar testing reveals good finger-nose-finger and heel-to-shin bilaterally. Gait and station: Ambulates short distances in the hall, can walk on heels and toes . She  uses a cane for ambulation. Tandem gait is slightly unsteady. Romberg is negative. No drift is seen. Reflexes: Deep tendon reflexes are symmetric, but are depressed bilaterally. Toes are downgoing bilaterally.   DIAGNOSTIC DATA (LABS, IMAGING, TESTING) - I reviewed patient records, labs, notes, testing and imaging myself where available.  Lab Results  Component Value Date   WBC 4.6 02/11/2015   HGB 11.7* 02/11/2015   HCT 36.1 02/11/2015   MCV 92.8 02/11/2015   PLT 249 02/11/2015      Component Value Date/Time   NA 140 02/11/2015 1849   K 4.1 02/11/2015 1849   CL 108 02/11/2015 1849   CO2 25 02/11/2015 1849   GLUCOSE 98 02/11/2015 1849   BUN 15 02/11/2015 1849   CREATININE 0.75 02/11/2015 1849   CALCIUM 9.6 02/11/2015 1849   PROT 7.3 12/02/2012 0615   ALBUMIN 3.3* 12/02/2012 0615   AST 10 12/02/2012 0615   ALT 13 12/02/2012 0615   ALKPHOS 72 12/02/2012 0615   BILITOT 0.3 12/02/2012 0615   GFRNONAA >60 02/11/2015 1849   GFRAA >60 02/11/2015 1849    Lab Results  Component Value Date   VITAMINB12 388 09/07/2014   Lab Results  Component Value Date   TSH 2.150 09/07/2014      ASSESSMENT AND PLAN  49 y.o. year old female  has a past medical history of  Spinal stenosis;  Bipolar 2 disorder ([place]); Obesity;  Sleep apnea;  Migraine; Spinal stenosis in cervical region (09/07/2014); and gait instability here to follow-up. She also has left  hemisensory deficit on exam  Patient given the results of her MRI of the brain which were normal. She no showed for her cervical MRI this was during her recent knee replacement, we will reschedule this Repeat sedimentation rate today mildly elevated at last visit She will follow-up in 6 months Dennie Bible, [facility], [facility], APRN  [facility] [address] [place], [place] [address] [phone]

## 2015-03-11 NOTE — Patient Instructions (Signed)
Will get sed rate today F/U in 6 months

## 2015-03-11 NOTE — Telephone Encounter (Signed)
Called/LMOM for patient letting her know that [place] [facility] will be obtaining the authorization for her MRI. If she needed further assistance from me to call my direct number [phone]. Thanks!

## 2015-03-12 LAB — SEDIMENTATION RATE: Sed Rate: 13 mm/hr (ref 0–40)

## 2015-03-24 ENCOUNTER — Ambulatory Visit
Admission: RE | Admit: 2015-03-24 | Discharge: 2015-03-24 | Disposition: A | Discharge status: Home / Self Care | Payer: Medicare Other | Source: Ambulatory Visit | Attending: Neurology | Admitting: Neurology

## 2015-03-24 DIAGNOSIS — M4802 Spinal stenosis, cervical region: Secondary | ICD-10-CM

## 2015-03-24 DIAGNOSIS — R413 Other amnesia: Secondary | ICD-10-CM

## 2015-03-24 DIAGNOSIS — R32 Unspecified urinary incontinence: Secondary | ICD-10-CM

## 2015-03-24 DIAGNOSIS — G441 Vascular headache, not elsewhere classified: Secondary | ICD-10-CM

## 2015-03-24 DIAGNOSIS — R269 Unspecified abnormalities of gait and mobility: Secondary | ICD-10-CM | POA: Diagnosis not present

## 2015-03-24 DIAGNOSIS — R3915 Urgency of urination: Secondary | ICD-10-CM

## 2015-03-24 DIAGNOSIS — R42 Dizziness and giddiness: Secondary | ICD-10-CM

## 2015-03-24 DIAGNOSIS — R1314 Dysphagia, pharyngoesophageal phase: Secondary | ICD-10-CM

## 2015-03-25 ENCOUNTER — Telehealth: Payer: Self-pay | Admitting: Neurology

## 2015-03-25 NOTE — Telephone Encounter (Signed)
I called the patient. The patient does have spinal stenosis at a couple levels, there has been some progression of the C5-6 level. There is no evidence of cord injury, but there is deformation of the spinal cord.. The patient does report some mild changes in walking, and some neck discomfort. She has been seen by Dr. Joya Salm previously. I have recommended another referral to his office, she wants to "think about it".    MRI cervical spine results 03/25/15:  IMPRESSION: This is an abnormal MRI of the cervical spine showing the following: 1. Moderate spinal stenosis at C5-C6 of 7.4 mm due to disc protrusion and uncovertebral spurring. There is mild foraminal narrowing at this level but no definite nerve root compression. The extent of spinal stenosis has progressed since the MRI dated 06/27/2012 when it measured 7.9 mm. 2. Mild spinal stenosis at C4-C5 measuring 8.4 mm due to disc bulging, uncovertebral spurring and possibly congenital show narrowing of the spinal canal. There is no nerve root compression. This has regressed slightly compared to 06/27/2012. 3. Small right paramedian disc protrusion at C6-C7 that has occurred since the 06/27/2012 MRI.

## 2015-04-01 ENCOUNTER — Other Ambulatory Visit: Payer: Self-pay | Admitting: Neurosurgery

## 2015-04-01 DIAGNOSIS — M5416 Radiculopathy, lumbar region: Secondary | ICD-10-CM

## 2015-04-09 ENCOUNTER — Ambulatory Visit
Admission: RE | Admit: 2015-04-09 | Discharge: 2015-04-09 | Disposition: A | Discharge status: Home / Self Care | Payer: Medicare Other | Source: Ambulatory Visit | Attending: Neurosurgery | Admitting: Neurosurgery

## 2015-04-09 DIAGNOSIS — M5416 Radiculopathy, lumbar region: Secondary | ICD-10-CM

## 2015-04-17 ENCOUNTER — Telehealth: Payer: Self-pay | Admitting: *Deleted

## 2015-04-17 NOTE — Telephone Encounter (Signed)
Placed call to pt making her aware that her insurance, Medicare, does not cover her Omeprazole Rx.  This may be due to being and OTC med. Pt advised that she may call ins carrier to verify coverage for Part D. Pt advised to call office if she has any information regarding Rx. Pt states understanding.

## 2015-05-14 ENCOUNTER — Telehealth: Payer: Self-pay | Admitting: *Deleted

## 2015-05-14 NOTE — Telephone Encounter (Signed)
Called pt regarding PA that are being received for Zofran Rx. Pt states that this should be sent to her PCP. Pt states that she has made pharmacy aware. Pt advised that I will call pharmacy and make them aware as well.  Pt states that PCP is taking care of her PA due to insurance change.  Pt made aware that if she is continuing on this medication she may need to have her PCP send new order to pharmacy.  Call then placed to pharmacy.  LM making them aware that pt request this medication be sent to her PCP.

## 2015-05-26 DIAGNOSIS — I10 Essential (primary) hypertension: Secondary | ICD-10-CM | POA: Insufficient documentation

## 2015-05-26 DIAGNOSIS — G35 Multiple sclerosis: Secondary | ICD-10-CM | POA: Insufficient documentation

## 2015-06-14 ENCOUNTER — Encounter (HOSPITAL_COMMUNITY): Payer: Self-pay | Admitting: Nurse Practitioner

## 2015-06-14 ENCOUNTER — Emergency Department (HOSPITAL_COMMUNITY)
Admission: EM | Admit: 2015-06-14 | Discharge: 2015-06-14 | Disposition: A | Discharge status: Home / Self Care | Payer: Medicare Other | Attending: Emergency Medicine | Admitting: Emergency Medicine

## 2015-06-14 DIAGNOSIS — M25551 Pain in right hip: Secondary | ICD-10-CM | POA: Diagnosis not present

## 2015-06-14 DIAGNOSIS — I10 Essential (primary) hypertension: Secondary | ICD-10-CM | POA: Insufficient documentation

## 2015-06-14 DIAGNOSIS — E669 Obesity, unspecified: Secondary | ICD-10-CM | POA: Insufficient documentation

## 2015-06-14 DIAGNOSIS — R32 Unspecified urinary incontinence: Secondary | ICD-10-CM | POA: Diagnosis not present

## 2015-06-14 DIAGNOSIS — R1030 Lower abdominal pain, unspecified: Secondary | ICD-10-CM | POA: Diagnosis not present

## 2015-06-14 NOTE — ED Notes (Signed)
The patient advised me that she was in a lot of pain.  She said she wanted to leave.  I assessed her pain and asked her if she needed something for pain and she said yes.  I read in her note that her Ortho MD had given her oxycodone, and a muscle relaxer with no relief.   I advised her I could give her some tylenol for pain.  She said she needed an IV and pain medication through the IV.  I advised the patient I could not do that.  She said she was leaving and her daughter was too.  Daughter had checked in due to being in a MVC.  Both Mrs. Wahlert and her daughter, Josph Macho were able to walk out with no problems.

## 2015-06-14 NOTE — ED Notes (Signed)
Pt c/o R groin, R hip pain radiating into lower back for several days, worse since yesterday. She saw her ortho md who ordered an MRI and gave her oxycodone and a muscle relaxer with no relief. she also reports some episodes of urinary incontinence, but she thinks it may be related to increased time to get to toilet due to pain. Heat provides some pain relief. Movement increases the pain.

## 2015-08-15 ENCOUNTER — Other Ambulatory Visit: Payer: Self-pay | Admitting: Internal Medicine

## 2015-08-15 DIAGNOSIS — Z1231 Encounter for screening mammogram for malignant neoplasm of breast: Secondary | ICD-10-CM

## 2015-08-27 ENCOUNTER — Encounter: Payer: Self-pay | Admitting: Obstetrics

## 2015-08-27 ENCOUNTER — Ambulatory Visit (INDEPENDENT_AMBULATORY_CARE_PROVIDER_SITE_OTHER): Payer: Medicare HMO | Admitting: Obstetrics

## 2015-08-27 VITALS — BP 144/92 | HR 88

## 2015-08-27 DIAGNOSIS — K640 First degree hemorrhoids: Secondary | ICD-10-CM

## 2015-08-27 DIAGNOSIS — R300 Dysuria: Secondary | ICD-10-CM | POA: Diagnosis not present

## 2015-08-27 DIAGNOSIS — R350 Frequency of micturition: Secondary | ICD-10-CM | POA: Diagnosis not present

## 2015-08-27 DIAGNOSIS — N3281 Overactive bladder: Secondary | ICD-10-CM | POA: Diagnosis not present

## 2015-08-27 LAB — POCT URINALYSIS DIPSTICK
BILIRUBIN UA: NEGATIVE
GLUCOSE UA: NEGATIVE
KETONES UA: NEGATIVE
LEUKOCYTES UA: NEGATIVE
Nitrite, UA: NEGATIVE
Spec Grav, UA: 1.02
Urobilinogen, UA: 1
pH, UA: 6

## 2015-08-28 ENCOUNTER — Encounter: Payer: Self-pay | Admitting: Obstetrics

## 2015-08-28 MED ORDER — HYDROCORTISONE 1 % RE CREA: 1.0000 | TOPICAL_CREAM | Freq: Two times a day (BID) | RECTAL | Status: AC

## 2015-08-28 NOTE — Progress Notes (Signed)
Subjective:        Brenda Odonnell is a 50 y.o. female here for a routine exam.  Current complaints: Urinary frequency and leaking of urine with full bladder before getting to bathroom.  Denies leaking with cough, sneeze, etc.  She has had a hysterectomy for fibroids many years ago.  Hot flashes have worsened over the past year.  Personal health questionnaire:  Is patient Ashkenazi Jewish, have a family history of breast and/or ovarian cancer: no Is there a family history of uterine cancer diagnosed at age < 48, gastrointestinal cancer, urinary tract cancer, family member who is a Field seismologist syndrome-associated carrier: no Is the patient overweight and hypertensive, family history of diabetes, personal history of gestational diabetes, preeclampsia or PCOS: no Is patient over 27, have PCOS,  family history of premature CHD under age 53, diabetes, smoke, have hypertension or peripheral artery disease:  no At any time, has a partner hit, kicked or otherwise hurt or frightened you?: no Over the past 2 weeks, have you felt down, depressed or hopeless?: no Over the past 2 weeks, have you felt little interest or pleasure in doing things?:no   Gynecologic History No LMP recorded. Patient has had a hysterectomy. Contraception: status post hysterectomy Last Pap: unknown. Results were: normal Last mammogram: 2016. Results were: normal  Obstetric History OB History  Gravida Para Term Preterm AB SAB TAB Ectopic Multiple Living  3 3 3       3     # Outcome Date GA Lbr Len/2nd Weight Sex Delivery Anes PTL Lv  3 Term 09/10/89 [redacted]w[redacted]d  7 lb 10 oz (3.459 kg) F CS-LTranv Spinal N Y  2 Term 10/05/86 [redacted]w[redacted]d  7 lb 14 oz (3.572 kg) F CS-LTranv Spinal N Y  1 Term 06/23/84 [redacted]w[redacted]d  9 lb (4.082 kg) F CS-LTranv Spinal N Y      Past Medical History  Diagnosis Date  . Sinus infection   . Hypertension   . Arthritis   . Spinal stenosis   . Glaucoma   . Carpal tunnel syndrome   . Depression   . Bipolar 2  disorder ([place])   . Obesity   . Arthritis   . Glaucoma   . Leiomyoma of uterus, unspecified   . Sleep apnea   . MS (multiple sclerosis) ([place])   . Migraine   . Spinal stenosis in cervical region 09/07/2014    C5-6 level  . GERD (gastroesophageal reflux disease)     Past Surgical History  Procedure Laterality Date  . Carpal tunnel release Bilateral   . Abdominal hysterectomy    . Knee surgery Left     arthroscopic  . Myelogram    . Cesarean section    . Breast reduction surgery    . Total knee arthroplasty Left 11/23/2014    Procedure: LEFT TOTAL KNEE ARTHROPLASTY;  Surgeon: Mcarthur Rossetti, MD;  Location: [facility];  Service: Orthopedics;  Laterality: Left;     Current outpatient prescriptions:  .  amphetamine-dextroamphetamine (ADDERALL XR) 30 MG 24 hr capsule, Take 30 mg by mouth daily., Disp: , Rfl:  .  amphetamine-dextroamphetamine (ADDERALL) 10 MG tablet, TAKE 1 TABLET BY MOUTH EVERY DAY AT NOON, Disp: , Rfl: 0 .  baclofen (LIORESAL) 20 MG tablet, Take 20 mg by mouth 3 (three) times daily., Disp: , Rfl:  .  dorzolamide (TRUSOPT) 2 % ophthalmic solution, Place 1 drop into both eyes 2 (two) times daily. , Disp: , Rfl:  .  FLUoxetine (PROZAC) 20  MG capsule, Take 20 mg by mouth daily., Disp: , Rfl:  .  fluticasone (FLONASE) 50 MCG/ACT nasal spray, Place 1 spray into both nostrils daily as needed for allergies or rhinitis. , Disp: , Rfl: 0 .  lamoTRIgine (LAMICTAL) 200 MG tablet, Take 200 mg by mouth daily., Disp: , Rfl:  .  lisinopril (PRINIVIL,ZESTRIL) 40 MG tablet, Take 1 tablet (40 mg total) by mouth daily., Disp: , Rfl:  .  LORazepam (ATIVAN) 0.5 MG tablet, Take 0.5 mg by mouth 3 (three) times daily as needed for anxiety. , Disp: , Rfl:  .  nortriptyline (PAMELOR) 25 MG capsule, Take 25 mg by mouth every evening., Disp: , Rfl: 1 .  omeprazole (PRILOSEC) 40 MG capsule, Take 1 capsule (40 mg total) by mouth daily., Disp: 30 capsule, Rfl: 11 .  ondansetron (ZOFRAN) 4 MG tablet,  Take 1 tablet (4 mg total) by mouth every 8 (eight) hours as needed for nausea or vomiting., Disp: 30 tablet, Rfl: 2 .  oxyCODONE-acetaminophen (PERCOCET) 10-325 MG tablet, take 1 tablet by mouth every 8 hours if needed for severe pain, Disp: , Rfl: 0 .  topiramate (TOPAMAX) 100 MG tablet, Take 1 tablet (100 mg total) by mouth daily. (Patient taking differently: Take 200 mg by mouth daily. ), Disp: 90 tablet, Rfl: 1 .  Vitamin D, Ergocalciferol, (DRISDOL) 50000 UNITS CAPS capsule, Take 50,000 Units by mouth every 7 (seven) days. Sunday, Disp: , Rfl:  .  XIIDRA 5 % SOLN, Place 1 drop into both eyes daily., Disp: , Rfl: 0 .  zolpidem (AMBIEN) 10 MG tablet, Take 10 mg by mouth at bedtime., Disp: , Rfl:  Allergies  Allergen Reactions  . Ibuprofen Nausea And Vomiting    Due to gird    Social History  Substance Use Topics  . Smoking status: Never Smoker   . Smokeless tobacco: Never Used  . Alcohol Use: Yes     Comment: socially, red wine    Family History  Problem Relation Age of Onset  . Hypertension    . Hypertension Father   . Hyperlipidemia Father   . Fibromyalgia Mother   . Diabetes Mother   . Healthy Sister   . Healthy Brother       Review of Systems  Constitutional: negative for fatigue and weight loss Respiratory: negative for cough and wheezing Cardiovascular: negative for chest pain, fatigue and palpitations Gastrointestinal: negative for abdominal pain and change in bowel habits. positive for occasional flare up of hemorrhoids Musculoskeletal:negative for myalgias Neurological: negative for gait problems and tremors Behavioral/Psych: negative for abusive relationship, depression Endocrine: positive for hot flashes  Genitourinary: positive for leaking of urine with full bladder Integument/breast: negative for breast lump, breast tenderness, nipple discharge and skin lesion(s)    Objective:       BP 144/92 mmHg  Pulse 88 General:   alert  Skin:   no rash or  abnormalities  Lungs:   clear to auscultation bilaterally  Heart:   regular rate and rhythm, S1, S2 normal, no murmur, click, rub or gallop  Breasts:   normal without suspicious masses, skin or nipple changes or axillary nodes  Abdomen:  normal findings: no organomegaly, soft, non-tender and no hernia  Pelvis:  External genitalia: normal general appearance Urinary system: urethral meatus normal and bladder without fullness, nontender Vaginal: normal without tenderness, induration or masses Cervix: absent Adnexa: normal bimanual exam Uterus: absent   Lab Review Urine pregnancy test Labs reviewed yes Radiologic studies reviewed yes  Assessment:    Morbidly obese female   OAB  Vasomotor instability  Hemorrhoids  Multiple medical problems     Plan:   Referred to Urology Discussed treatment options for hot flashes Hydrocortisone cream Rx for hemorrhoids  Education reviewed: calcium supplements, low fat, low cholesterol diet, self breast exams and weight bearing exercise. Follow up in: 1 year.   Meds ordered this encounter  Medications  . baclofen (LIORESAL) 20 MG tablet    Sig: Take 20 mg by mouth 3 (three) times daily.   Orders Placed This Encounter  Procedures  . Urine Culture  . Ambulatory referral to Urology    Referral Priority:  Routine    Referral Type:  Consultation    Referral Reason:  Specialty Services Required    Requested Specialty:  Urology    Number of Visits Requested:  1  . POCT urinalysis dipstick

## 2015-08-29 LAB — URINE CULTURE

## 2015-09-07 ENCOUNTER — Other Ambulatory Visit: Payer: Self-pay | Admitting: Obstetrics

## 2015-09-21 ENCOUNTER — Encounter (HOSPITAL_COMMUNITY): Payer: Self-pay

## 2015-09-21 ENCOUNTER — Emergency Department (HOSPITAL_COMMUNITY)
Admission: EM | Admit: 2015-09-21 | Discharge: 2015-09-21 | Disposition: A | Discharge status: Home / Self Care | Payer: Medicare HMO | Attending: Emergency Medicine | Admitting: Emergency Medicine

## 2015-09-21 ENCOUNTER — Emergency Department (HOSPITAL_COMMUNITY): Payer: Medicare HMO

## 2015-09-21 ENCOUNTER — Ambulatory Visit (HOSPITAL_COMMUNITY): Admission: EM | Admit: 2015-09-21 | Payer: Medicare HMO

## 2015-09-21 DIAGNOSIS — M545 Low back pain: Secondary | ICD-10-CM

## 2015-09-21 DIAGNOSIS — S199XXA Unspecified injury of neck, initial encounter: Secondary | ICD-10-CM | POA: Diagnosis not present

## 2015-09-21 DIAGNOSIS — Y998 Other external cause status: Secondary | ICD-10-CM | POA: Diagnosis not present

## 2015-09-21 DIAGNOSIS — I10 Essential (primary) hypertension: Secondary | ICD-10-CM | POA: Insufficient documentation

## 2015-09-21 DIAGNOSIS — F3181 Bipolar II disorder: Secondary | ICD-10-CM | POA: Diagnosis not present

## 2015-09-21 DIAGNOSIS — W19XXXA Unspecified fall, initial encounter: Secondary | ICD-10-CM

## 2015-09-21 DIAGNOSIS — G43909 Migraine, unspecified, not intractable, without status migrainosus: Secondary | ICD-10-CM | POA: Insufficient documentation

## 2015-09-21 DIAGNOSIS — K219 Gastro-esophageal reflux disease without esophagitis: Secondary | ICD-10-CM | POA: Insufficient documentation

## 2015-09-21 DIAGNOSIS — Y9289 Other specified places as the place of occurrence of the external cause: Secondary | ICD-10-CM | POA: Insufficient documentation

## 2015-09-21 DIAGNOSIS — S3992XA Unspecified injury of lower back, initial encounter: Secondary | ICD-10-CM | POA: Diagnosis not present

## 2015-09-21 DIAGNOSIS — R52 Pain, unspecified: Secondary | ICD-10-CM

## 2015-09-21 DIAGNOSIS — G8929 Other chronic pain: Secondary | ICD-10-CM | POA: Diagnosis not present

## 2015-09-21 DIAGNOSIS — W108XXA Fall (on) (from) other stairs and steps, initial encounter: Secondary | ICD-10-CM | POA: Diagnosis not present

## 2015-09-21 DIAGNOSIS — Z7952 Long term (current) use of systemic steroids: Secondary | ICD-10-CM | POA: Diagnosis not present

## 2015-09-21 DIAGNOSIS — Z8709 Personal history of other diseases of the respiratory system: Secondary | ICD-10-CM | POA: Diagnosis not present

## 2015-09-21 DIAGNOSIS — Z86018 Personal history of other benign neoplasm: Secondary | ICD-10-CM | POA: Diagnosis not present

## 2015-09-21 DIAGNOSIS — E669 Obesity, unspecified: Secondary | ICD-10-CM | POA: Insufficient documentation

## 2015-09-21 DIAGNOSIS — H409 Unspecified glaucoma: Secondary | ICD-10-CM | POA: Insufficient documentation

## 2015-09-21 DIAGNOSIS — M25562 Pain in left knee: Secondary | ICD-10-CM

## 2015-09-21 DIAGNOSIS — Y9301 Activity, walking, marching and hiking: Secondary | ICD-10-CM | POA: Insufficient documentation

## 2015-09-21 DIAGNOSIS — Z79899 Other long term (current) drug therapy: Secondary | ICD-10-CM | POA: Diagnosis not present

## 2015-09-21 DIAGNOSIS — S8002XA Contusion of left knee, initial encounter: Secondary | ICD-10-CM | POA: Insufficient documentation

## 2015-09-21 DIAGNOSIS — M1990 Unspecified osteoarthritis, unspecified site: Secondary | ICD-10-CM | POA: Diagnosis not present

## 2015-09-21 DIAGNOSIS — S8992XA Unspecified injury of left lower leg, initial encounter: Secondary | ICD-10-CM | POA: Diagnosis present

## 2015-09-21 DIAGNOSIS — M542 Cervicalgia: Secondary | ICD-10-CM

## 2015-09-21 MED ORDER — CYCLOBENZAPRINE HCL 10 MG PO TABS: 10.0000 mg | ORAL_TABLET | Freq: Two times a day (BID) | ORAL | Status: DC | PRN

## 2015-09-21 MED ORDER — KETOROLAC TROMETHAMINE 30 MG/ML IJ SOLN
30.0000 mg | Freq: Once | INTRAMUSCULAR | Status: AC
  Administered 2015-09-21: 30 mg via INTRAMUSCULAR
  Filled 2015-09-21: qty 1

## 2015-09-21 MED ORDER — ONDANSETRON 4 MG PO TBDP
4.0000 mg | ORAL_TABLET | Freq: Once | ORAL | Status: AC
  Administered 2015-09-21: 4 mg via ORAL
  Filled 2015-09-21: qty 1

## 2015-09-21 NOTE — ED Provider Notes (Signed)
CSN: [id number]     Arrival date & time 09/21/15  1520 History   First MD Initiated Contact with Patient 09/21/15 1559     Chief Complaint  Patient presents with  . fall 5/17      (Consider location/radiation/quality/duration/timing/severity/associated sxs/prior Treatment) HPI   Patient is a 50 year old female with a history of cervical spinal stenosis, obesity, arthritis, MS, presents to the emergency department after a fall on Wednesday. Patient states she was walking up her deck steps when the last step gave out causing her left foot to fall through the board. She grabbed a hold of the handrail with her right arm and fell forward onto her left knee. She states pain and swelling in her left knee with limited range of motion due to swelling. She states pain in her lower back that is sharp/stabbing with movement, 10/10, and throbbing at rest, 8/10. She states his back pain is different than her normal chronic back pain. She's also having neck tightness/burning pain, 7/10, that is constant nonradiating and similar to her chronic neck pain. Patient states she took Percocet 10/325 this morning at 11 with little relief. Patient saw her PCP yesterday who gave her an order for left knee, cervical spine, lumbar spine x-rays. She was supposed to go to radiology yesterday she did not go. She is here today to have these imaging studies done. She denies numbness/tingling, weakness, loss of bowel or bladder function, saddle anesthesia,, chills, fever, nausea, vomiting, double pain.  Past Medical History  Diagnosis Date  . Sinus infection   . Hypertension   . Arthritis   . Spinal stenosis   . Glaucoma   . Carpal tunnel syndrome   . Depression   . Bipolar 2 disorder ([place])   . Obesity   . Arthritis   . Glaucoma   . Leiomyoma of uterus, unspecified   . Sleep apnea   . MS (multiple sclerosis) ([place])   . Migraine   . Spinal stenosis in cervical region 09/07/2014    C5-6 level  . GERD (gastroesophageal  reflux disease)    Past Surgical History  Procedure Laterality Date  . Carpal tunnel release Bilateral   . Abdominal hysterectomy    . Knee surgery Left     arthroscopic  . Myelogram    . Cesarean section    . Breast reduction surgery    . Total knee arthroplasty Left 11/23/2014    Procedure: LEFT TOTAL KNEE ARTHROPLASTY;  Surgeon: Mcarthur Rossetti, MD;  Location: [facility];  Service: Orthopedics;  Laterality: Left;   Family History  Problem Relation Age of Onset  . Hypertension    . Hypertension Father   . Hyperlipidemia Father   . Fibromyalgia Mother   . Diabetes Mother   . Healthy Sister   . Healthy Brother    Social History  Substance Use Topics  . Smoking status: Never Smoker   . Smokeless tobacco: Never Used  . Alcohol Use: Yes     Comment: socially, red wine   OB History    Gravida Para Term Preterm AB TAB SAB Ectopic Multiple Living   3 3 3       3      Review of Systems  Constitutional: Negative for fever and chills.  Respiratory: Negative for cough, chest tightness and shortness of breath.   Cardiovascular: Negative for chest pain and leg swelling.  Gastrointestinal: Negative for nausea, vomiting and abdominal pain.  Musculoskeletal: Positive for back pain, joint swelling, arthralgias and neck  pain.  Neurological: Negative for dizziness, syncope, weakness and light-headedness.  Psychiatric/Behavioral: Negative for confusion and agitation.      Allergies  Ibuprofen  Home Medications   Prior to Admission medications   Medication Sig Start Date End Date Taking? Authorizing Provider  ALPRAZolam (XANAX) 0.25 MG tablet Take 0.25 mg by mouth 3 (three) times daily as needed. 09/12/15  Yes Historical Provider, MD  amphetamine-dextroamphetamine (ADDERALL XR) 30 MG 24 hr capsule Take 30 mg by mouth daily.   Yes Historical Provider, MD  amphetamine-dextroamphetamine (ADDERALL) 10 MG tablet Take 10 mg by mouth daily at 12 noon.   Yes Historical Provider, MD   amphetamine-dextroamphetamine (ADDERALL) 30 MG tablet Take 30 mg by mouth every morning.   Yes Historical Provider, MD  atenolol (TENORMIN) 50 MG tablet Take 50 mg by mouth daily. 08/28/15  Yes Historical Provider, MD  baclofen (LIORESAL) 20 MG tablet Take 20 mg by mouth 3 (three) times daily.   Yes Historical Provider, MD  CONSTULOSE 10 GM/15ML solution Take 30 g by mouth daily as needed. 06/17/15  Yes Historical Provider, MD  dorzolamide (TRUSOPT) 2 % ophthalmic solution Place 1 drop into both eyes 2 (two) times daily.    Yes Historical Provider, MD  FLUoxetine (PROZAC) 20 MG capsule Take 20 mg by mouth daily.   Yes Historical Provider, MD  fluticasone (FLONASE) 50 MCG/ACT nasal spray Place 1 spray into both nostrils daily as needed for allergies or rhinitis.  06/06/14  Yes Historical Provider, MD  lamoTRIgine (LAMICTAL) 200 MG tablet Take 200 mg by mouth daily.   Yes Historical Provider, MD  lisinopril (PRINIVIL,ZESTRIL) 40 MG tablet Take 1 tablet (40 mg total) by mouth daily. 12/05/12  Yes Milta Deiters T Mashburn, PA-C  nortriptyline (PAMELOR) 25 MG capsule Take 25 mg by mouth every evening. 02/16/15  Yes Historical Provider, MD  omeprazole (PRILOSEC) 40 MG capsule Take 1 capsule (40 mg total) by mouth daily. 10/26/14  Yes Shelly Bombard, MD  ondansetron (ZOFRAN) 4 MG tablet Take 1 tablet (4 mg total) by mouth every 8 (eight) hours as needed for nausea or vomiting. 10/26/14  Yes Shelly Bombard, MD  oxyCODONE-acetaminophen (PERCOCET) 10-325 MG tablet take 1 tablet by mouth every 8 hours if needed for severe pain 02/16/15  Yes Historical Provider, MD  topiramate (TOPAMAX) 100 MG tablet Take 1 tablet (100 mg total) by mouth daily. Patient taking differently: Take 200 mg by mouth daily.  11/24/14  Yes Kathrynn Ducking, MD  Vitamin D, Ergocalciferol, (DRISDOL) 50000 UNITS CAPS capsule Take 50,000 Units by mouth every 7 (seven) days. Sunday   Yes Historical Provider, MD  XIIDRA 5 % SOLN Place 1 drop into both  eyes 2 (two) times daily as needed (for irritation).  01/31/15  Yes Historical Provider, MD  zolpidem (AMBIEN) 10 MG tablet Take 10 mg by mouth at bedtime.   Yes Historical Provider, MD  ALPRAZolam Duanne Moron) 0.5 MG tablet Take 0.5 mg by mouth 2 (two) times daily.    Historical Provider, MD  amphetamine-dextroamphetamine (ADDERALL) 10 MG tablet TAKE 1 TABLET BY MOUTH EVERY DAY AT NOON 02/13/15   Historical Provider, MD  azithromycin (ZITHROMAX) 250 MG tablet take 2 tablets by mouth today then 1 tablet once daily 09/08/15   Shelly Bombard, MD  cyclobenzaprine (FLEXERIL) 10 MG tablet Take 1 tablet (10 mg total) by mouth 2 (two) times daily as needed for muscle spasms. 09/21/15   Kalman Drape, PA  furosemide (LASIX) 40 MG tablet Take  40 mg by mouth daily. 09/07/15   Historical Provider, MD  hydrocortisone (PROCTO-PAK) 1 % CREA Apply 1 Applicatorful topically 2 (two) times daily. 08/28/15   Shelly Bombard, MD  LORazepam (ATIVAN) 0.5 MG tablet Take 0.5 mg by mouth 3 (three) times daily as needed for anxiety.  12/05/12   Ruben Im, PA-C   BP 139/96 mmHg  Pulse 76  Temp(Src) 98.2 F (36.8 C) (Oral)  Resp 20  SpO2 99%  Physical Exam  Constitutional: She appears well-developed and well-nourished. No distress.  HENT:  Head: Normocephalic and atraumatic.  Eyes: Conjunctivae are normal.  Neck:  No step-offs or deformities noted, mild TTP of the cervical spine, moderate TTP of the right paraspinal muscles, no edema, erythema, ecchymosis. ROM limited due to pain.  Pulmonary/Chest: Effort normal.  Abdominal: Soft. There is no tenderness.  Musculoskeletal:  Examination of left lower extremity revealed contusions to the patellar tendon, lateral knee, lateral mid to lower shin, mild swelling of the knee joint, well-healed incisional scar from knee replacement, limited range of motion due to pain and swelling, TTP of the patella, joint lines. No edema noted.  TTP of the lumbar paraspinal muscles, no  step-offs or deformities noted, no contusions, erythema or edema.  Patient able to walk while in the ED, gait slightly shuffle due to pain.  Neurological: She is alert. Coordination normal.  Skin: Skin is warm and dry. No rash noted. She is not diaphoretic.  Psychiatric: She has a normal mood and affect. Her behavior is normal.    ED Course  Procedures (including critical care time) Labs Review Labs Reviewed - No data to display  Imaging Review Dg Cervical Spine Complete  09/21/2015  CLINICAL DATA:  Fall several days ago with ongoing pain. EXAM: CERVICAL SPINE - COMPLETE 4+ VIEW COMPARISON:  None. FINDINGS: The pre odontoid space and prevertebral soft tissues are normal. Reversal of normal lordosis centered at C4-5 is identified. No other malalignment. Degenerative changes are seen most marked at C4-5 and C5-6 with anterior and posterior osteophytes. Narrowing of the right C4-5 neural foramen is seen due to posterior osteophytes. Lateral masses of C1 align with C2. No fracture through the odontoid process. No other acute abnormalities. IMPRESSION: Degenerative changes.  No acute fracture or traumatic malalignment. Electronically Signed   By: Dorise Bullion III M.D   On: 09/21/2015 18:05   Dg Lumbar Spine Complete  09/21/2015  CLINICAL DATA:  50 year old who fell 3 days ago three days ago and complains of persistent low back pain. EXAM: LUMBAR SPINE - COMPLETE 4+ VIEW COMPARISON:  MRI lumbar spine 04/09/2015, 05/09/2012. Lumbar spine x-rays 06/21/2014, 01/09/2014. FINDINGS: There are 6 non rib-bearing lumbar type vertebrae. In order to be consistent with the numbering on the prior examinations, I will designate the last lumbar segment as L5, therefore indicating that T12 has no ribs. Anatomic alignment. No fractures. Well preserved disc spaces. No pars defects. Facet degenerative changes at L3-4, L4-5 and L5-S1. Sacroiliac joints intact. IMPRESSION: 1. No acute osseous abnormality. 2. Facet  degenerative changes at L3-4, L4-5 and L5-S1. 3. Please see above comments regarding the numbering of the lumbar spine. Electronically Signed   By: Evangeline Dakin M.D.   On: 09/21/2015 18:07   Dg Knee Complete 4 Views Left  09/21/2015  CLINICAL DATA:  50 year old who fell 3 days ago and has left knee pain. Initial encounter. Prior left knee arthroplasty. EXAM: LEFT KNEE - COMPLETE 4+ VIEW COMPARISON:  11/23/2014, 10/27/2014. FINDINGS: Prior left total  knee arthroplasty with anatomic alignment and no complicating features. No evidence of acute fracture or dislocation. Well preserved bone mineral density. No intrinsic osseous abnormality. Small joint effusion. IMPRESSION: 1. No acute osseous abnormality. 2. Prior left total knee arthroplasty with anatomic alignment and no complicating features. 3. Small joint effusion. Electronically Signed   By: Evangeline Dakin M.D.   On: 09/21/2015 18:09   I have personally reviewed and evaluated these images and lab results as part of my medical decision-making.   EKG Interpretation None      MDM   Final diagnoses:  Pain  Fall  Neck pain  Left knee pain  Low back pain without sciatica, unspecified back pain laterality   Patient with back pain, neck pain and left knee pain after a fall through her deck stairs 3 days ago. No LOC, pt did not hit her head, she did not fall on her buttock or her back.  No neurological deficits and normal neuro exam.  Patient can walk but states is painful.  No loss of bowel or bladder control.  No concern for cauda equina.  No fever, night sweats, weight loss, h/o cancer, IVDU. Patient was seen by her PCP yesterday who ordered x-rays. X-rays are negative for any acute bony abnormality or fractures. Instructed patient to follow up with her PCP on Monday. Gave patient Flexeril rx upon discharge. Patient is on chronic pain meds and no pain meds were given on discharge. Patient was given a shot of Toradol while in the ED. RICE  protocol discussed with the patient, strict return precautions were discussed. Patient expressed understanding the discharge instructions.   Kalman Drape, PA 09/21/15 1929  Charlesetta Shanks, MD 09/26/15 [phone]

## 2015-09-21 NOTE — ED Notes (Signed)
Pt provided with water and a [place] sandwich per Elmyra Ricks, [place]

## 2015-09-21 NOTE — Discharge Instructions (Signed)
Follow-up with your primary care provider in 2 days regarding your visit to the emergency department. Your x-rays were all negative for acute fracture.  Return to the emergency department if you experience extremity weakness, numbness/tingling, loss of bowel or bladder function, fever, chills.  Back Pain, Adult Back pain is very common in adults.The cause of back pain is rarely dangerous and the pain often gets better over time.The cause of your back pain may not be known. Some common causes of back pain include:  Strain of the muscles or ligaments supporting the spine.  Wear and tear (degeneration) of the spinal disks.  Arthritis.  Direct injury to the back. For many people, back pain may return. Since back pain is rarely dangerous, most people can learn to manage this condition on their own. HOME CARE INSTRUCTIONS Watch your back pain for any changes. The following actions may help to lessen any discomfort you are feeling:  Remain active. It is stressful on your back to sit or stand in one place for long periods of time. Do not sit, drive, or stand in one place for more than 30 minutes at a time. Take short walks on even surfaces as soon as you are able.Try to increase the length of time you walk each day.  Exercise regularly as directed by your health care provider. Exercise helps your back heal faster. It also helps avoid future injury by keeping your muscles strong and flexible.  Do not stay in bed.Resting more than 1-2 days can delay your recovery.  Pay attention to your body when you bend and lift. The most comfortable positions are those that put less stress on your recovering back. Always use proper lifting techniques, including:  Bending your knees.  Keeping the load close to your body.  Avoiding twisting.  Find a comfortable position to sleep. Use a firm mattress and lie on your side with your knees slightly bent. If you lie on your back, put a pillow under your  knees.  Avoid feeling anxious or stressed.Stress increases muscle tension and can worsen back pain.It is important to recognize when you are anxious or stressed and learn ways to manage it, such as with exercise.  Take medicines only as directed by your health care provider. Over-the-counter medicines to reduce pain and inflammation are often the most helpful.Your health care provider may prescribe muscle relaxant drugs.These medicines help dull your pain so you can more quickly return to your normal activities and healthy exercise.  Apply ice to the injured area:  Put ice in a plastic bag.  Place a towel between your skin and the bag.  Leave the ice on for 20 minutes, 2-3 times a day for the first 2-3 days. After that, ice and heat may be alternated to reduce pain and spasms.  Maintain a healthy weight. Excess weight puts extra stress on your back and makes it difficult to maintain good posture. SEEK MEDICAL CARE IF:  You have pain that is not relieved with rest or medicine.  You have increasing pain going down into the legs or buttocks.  You have pain that does not improve in one week.  You have night pain.  You lose weight.  You have a fever or chills. SEEK IMMEDIATE MEDICAL CARE IF:   You develop new bowel or bladder control problems.  You have unusual weakness or numbness in your arms or legs.  You develop nausea or vomiting.  You develop abdominal pain.  You feel faint.   This  information is not intended to replace advice given to you by your health care provider. Make sure you discuss any questions you have with your health care provider.   Document Released: 04/20/2005 Document Revised: 05/11/2014 Document Reviewed: 08/22/2013 Elsevier Interactive Patient Education [employer].

## 2015-09-21 NOTE — ED Notes (Signed)
Patient reports that she fell last Wednesday and has ongoing lower back pain with radiation to neck and right arm. Patient reports that she fell down steps outside. Has radiology studies ordered but doesn't want to come and leave and then come back due to the pain.

## 2015-09-21 NOTE — ED Notes (Signed)
Pt is in stable condition upon d/c and ambulates from ED. 

## 2015-10-21 ENCOUNTER — Other Ambulatory Visit: Payer: Self-pay | Admitting: Internal Medicine

## 2015-10-21 DIAGNOSIS — R5381 Other malaise: Secondary | ICD-10-CM

## 2015-11-24 ENCOUNTER — Other Ambulatory Visit: Payer: Self-pay | Admitting: Internal Medicine

## 2015-12-10 ENCOUNTER — Other Ambulatory Visit: Payer: Self-pay | Admitting: Internal Medicine

## 2015-12-21 ENCOUNTER — Other Ambulatory Visit: Payer: Self-pay | Admitting: Internal Medicine

## 2015-12-24 ENCOUNTER — Other Ambulatory Visit: Payer: Self-pay | Admitting: Physician Assistant

## 2015-12-24 DIAGNOSIS — M25551 Pain in right hip: Secondary | ICD-10-CM

## 2015-12-25 ENCOUNTER — Encounter: Payer: Self-pay | Admitting: Neurology

## 2015-12-25 ENCOUNTER — Ambulatory Visit (INDEPENDENT_AMBULATORY_CARE_PROVIDER_SITE_OTHER): Payer: Medicare HMO | Admitting: Neurology

## 2015-12-25 VITALS — BP 118/72 | HR 78 | Resp 18 | Ht 64.0 in | Wt 294.0 lb

## 2015-12-25 DIAGNOSIS — M4802 Spinal stenosis, cervical region: Secondary | ICD-10-CM | POA: Diagnosis not present

## 2015-12-25 DIAGNOSIS — R269 Unspecified abnormalities of gait and mobility: Secondary | ICD-10-CM

## 2015-12-25 MED ORDER — GABAPENTIN 300 MG PO CAPS: 300.0000 mg | ORAL_CAPSULE | Freq: Three times a day (TID) | ORAL | 3 refills | Status: AC

## 2015-12-25 NOTE — Patient Instructions (Addendum)
Fall Prevention in the Home  Falls can cause injuries and can affect people from all age groups. There are many simple things that you can do to make your home safe and to help prevent falls. WHAT CAN I DO ON THE OUTSIDE OF MY HOME?  Regularly repair the edges of walkways and driveways and fix any cracks.  Remove high doorway thresholds.  Trim any shrubbery on the main path into your home.  Use bright outdoor lighting.  Clear walkways of debris and clutter, including tools and rocks.  Regularly check that handrails are securely fastened and in good repair. Both sides of any steps should have handrails.  Install guardrails along the edges of any raised decks or porches.  Have leaves, snow, and ice cleared regularly.  Use sand or salt on walkways during winter months.  In the garage, clean up any spills right away, including grease or oil spills. WHAT CAN I DO IN THE BATHROOM?  Use night lights.  Install grab bars by the toilet and in the tub and shower. Do not use towel bars as grab bars.  Use non-skid mats or decals on the floor of the tub or shower.  If you need to sit down while you are in the shower, use a plastic, non-slip stool..  Keep the floor dry. Immediately clean up any water that spills on the floor.  Remove soap buildup in the tub or shower on a regular basis.  Attach bath mats securely with double-sided non-slip rug tape.  Remove throw rugs and other tripping hazards from the floor. WHAT CAN I DO IN THE BEDROOM?  Use night lights.  Make sure that a bedside light is easy to reach.  Do not use oversized bedding that drapes onto the floor.  Have a firm chair that has side arms to use for getting dressed.  Remove throw rugs and other tripping hazards from the floor. WHAT CAN I DO IN THE KITCHEN?   Clean up any spills right away.  Avoid walking on wet floors.  Place frequently used items in easy-to-reach places.  If you need to reach for something  above you, use a sturdy step stool that has a grab bar.  Keep electrical cables out of the way.  Do not use floor polish or wax that makes floors slippery. If you have to use wax, make sure that it is non-skid floor wax.  Remove throw rugs and other tripping hazards from the floor. WHAT CAN I DO IN THE STAIRWAYS?  Do not leave any items on the stairs.  Make sure that there are handrails on both sides of the stairs. Fix handrails that are broken or loose. Make sure that handrails are as long as the stairways.  Check any carpeting to make sure that it is firmly attached to the stairs. Fix any carpet that is loose or worn.  Avoid having throw rugs at the top or bottom of stairways, or secure the rugs with carpet tape to prevent them from moving.  Make sure that you have a light switch at the top of the stairs and the bottom of the stairs. If you do not have them, have them installed. WHAT ARE SOME OTHER FALL PREVENTION TIPS?  Wear closed-toe shoes that fit well and support your feet. Wear shoes that have rubber soles or low heels.  When you use a stepladder, make sure that it is completely opened and that the sides are firmly locked. Have someone hold the ladder while you   are using it. Do not climb a closed stepladder.  Add color or contrast paint or tape to grab bars and handrails in your home. Place contrasting color strips on the first and last steps.  Use mobility aids as needed, such as canes, walkers, scooters, and crutches.  Turn on lights if it is dark. Replace any light bulbs that burn out.  Set up furniture so that there are clear paths. Keep the furniture in the same spot.  Fix any uneven floor surfaces.  Choose a carpet design that does not hide the edge of steps of a stairway.  Be aware of any and all pets.  Review your medicines with your healthcare provider. Some medicines can cause dizziness or changes in blood pressure, which increase your risk of falling. Talk  with your health care provider about other ways that you can decrease your risk of falls. This may include working with a physical therapist or trainer to improve your strength, balance, and endurance.   This information is not intended to replace advice given to you by your health care provider. Make sure you discuss any questions you have with your health care provider.   Document Released: 04/10/2002 Document Revised: 09/04/2014 Document Reviewed: 05/25/2014 Elsevier Interactive Patient Education 2016 Elsevier Inc.  

## 2015-12-25 NOTE — Progress Notes (Signed)
Reason for visit: Gait disorder  Brenda Odonnell is an 50 y.o. female  History of present illness:  Brenda Odonnell is a 50 year old right-handed black female with a history of morbid obesity, sleep apnea on CPAP, and a gait disorder. The patient has well-documented cervical spinal stenosis, with several areas of flattening of the spinal cord, no spinal cord signal has been noted. The patient last had MRI evaluation of the cervical spine in November 2016, a referral to Dr. Joya Salm was recommended, but the patient did not wish to pursue this evaluation. The patient has had gradually worsening gait instability, she has fallen last in June 2017. The patient uses a cane for ambulation in part because of severe degenerative arthritis involving the right hip. The patient indicates that she will be getting MRI evaluation of the right hip in the near future. The patient reports numbness of the hands and feet bilaterally, but she is on Topamax therapy for headache. The patient indicates that she has lost 40 pounds, she is considering bariatric surgery. The patient reports urinary urgency and incontinence that is ongoing. She reports pain all over the body including the neck, shoulders, pain down the right arm, low back pain, right hip and right leg pain. The pain in the right hip refers into the right groin. The patient occasionally will have a sharp shooting pain down the right leg. She reports generalized fatigue, but then she says that she does not use her CPAP on a regular basis. She denies any overt muscle weakness of the extremities. She returns to this office for an evaluation.  Past Medical History:  Diagnosis Date  . Arthritis   . Arthritis   . Bipolar 2 disorder ([place])   . Carpal tunnel syndrome   . Depression   . GERD (gastroesophageal reflux disease)   . Glaucoma   . Glaucoma   . Hypertension   . Leiomyoma of uterus, unspecified   . Migraine   . MS (multiple sclerosis) ([place])   . Obesity   .  Sinus infection   . Sleep apnea   . Spinal stenosis   . Spinal stenosis in cervical region 09/07/2014   C5-6 level    Past Surgical History:  Procedure Laterality Date  . ABDOMINAL HYSTERECTOMY    . BREAST REDUCTION SURGERY    . CARPAL TUNNEL RELEASE Bilateral   . CESAREAN SECTION    . KNEE SURGERY Left    arthroscopic  . MYELOGRAM    . TOTAL KNEE ARTHROPLASTY Left 11/23/2014   Procedure: LEFT TOTAL KNEE ARTHROPLASTY;  Surgeon: Mcarthur Rossetti, MD;  Location: [facility];  Service: Orthopedics;  Laterality: Left;    Family History  Problem Relation Age of Onset  . Hypertension    . Hypertension Father   . Hyperlipidemia Father   . Fibromyalgia Mother   . Diabetes Mother   . Healthy Sister   . Healthy Brother     Social history:  reports that she has never smoked. She has never used smokeless tobacco. She reports that she drinks alcohol. She reports that she does not use drugs.    Allergies  Allergen Reactions  . Ibuprofen Nausea And Vomiting    Due to Jerrye Bushy    Medications:  Prior to Admission medications   Medication Sig Start Date End Date Taking? Authorizing Provider  ALPRAZolam (XANAX) 0.25 MG tablet Take 0.25 mg by mouth 3 (three) times daily as needed. 09/12/15   Historical Provider, MD  amphetamine-dextroamphetamine (ADDERALL XR) 30  MG 24 hr capsule Take 30 mg by mouth daily.    Historical Provider, MD  amphetamine-dextroamphetamine (ADDERALL) 10 MG tablet Take 10 mg by mouth daily at 12 noon.    Historical Provider, MD  amphetamine-dextroamphetamine (ADDERALL) 30 MG tablet Take 30 mg by mouth every morning.    Historical Provider, MD  atenolol (TENORMIN) 50 MG tablet Take 50 mg by mouth daily. 08/28/15   Historical Provider, MD  azithromycin (ZITHROMAX) 250 MG tablet take 2 tablets by mouth today then 1 tablet once daily 09/08/15   Shelly Bombard, MD  baclofen (LIORESAL) 20 MG tablet Take 20 mg by mouth 3 (three) times daily.    Historical Provider, MD  CONSTULOSE  10 GM/15ML solution Take 30 g by mouth daily as needed for moderate constipation.  06/17/15   Historical Provider, MD  cyclobenzaprine (FLEXERIL) 10 MG tablet Take 1 tablet (10 mg total) by mouth 2 (two) times daily as needed for muscle spasms. 09/21/15   Jessica L Focht, PA  dorzolamide (TRUSOPT) 2 % ophthalmic solution Place 1 drop into both eyes 2 (two) times daily.     Historical Provider, MD  FLUoxetine (PROZAC) 20 MG capsule Take 20 mg by mouth daily.    Historical Provider, MD  fluticasone (FLONASE) 50 MCG/ACT nasal spray Place 1 spray into both nostrils daily as needed for allergies or rhinitis.  06/06/14   Historical Provider, MD  furosemide (LASIX) 40 MG tablet Take 40 mg by mouth daily. 09/07/15   Historical Provider, MD  hydrocortisone (PROCTO-PAK) 1 % CREA Apply 1 Applicatorful topically 2 (two) times daily. 08/28/15   Shelly Bombard, MD  lamoTRIgine (LAMICTAL) 200 MG tablet Take 200 mg by mouth daily.    Historical Provider, MD  lisinopril (PRINIVIL,ZESTRIL) 40 MG tablet Take 1 tablet (40 mg total) by mouth daily. 12/05/12   Ruben Im, PA-C  nortriptyline (PAMELOR) 25 MG capsule Take 25 mg by mouth every evening. 02/16/15   Historical Provider, MD  omeprazole (PRILOSEC) 40 MG capsule Take 1 capsule (40 mg total) by mouth daily. 10/26/14   Shelly Bombard, MD  ondansetron (ZOFRAN) 4 MG tablet Take 1 tablet (4 mg total) by mouth every 8 (eight) hours as needed for nausea or vomiting. 10/26/14   Shelly Bombard, MD  oxyCODONE-acetaminophen (PERCOCET) 10-325 MG tablet take 1 tablet by mouth every 8 hours if needed for severe pain 02/16/15   Historical Provider, MD  topiramate (TOPAMAX) 100 MG tablet Take 1 tablet (100 mg total) by mouth daily. Patient taking differently: Take 200 mg by mouth daily.  11/24/14   Kathrynn Ducking, MD  Vitamin D, Ergocalciferol, (DRISDOL) 50000 UNITS CAPS capsule Take 50,000 Units by mouth every 7 (seven) days. Sunday    Historical Provider, MD  XIIDRA 5 % SOLN  Place 1 drop into both eyes 2 (two) times daily as needed (for irritation).  01/31/15   Historical Provider, MD  zolpidem (AMBIEN) 10 MG tablet Take 10 mg by mouth at bedtime.    Historical Provider, MD    ROS:  Out of a complete 14 system review of symptoms, the patient complains only of the following symptoms, and all other reviewed systems are negative.  Gait disorder Right hip pain  There were no vitals taken for this visit.  Physical Exam  General: The patient is alert and cooperative at the time of the examination. The patient is markedly obese.  Skin: No significant peripheral edema is noted.   Neurologic Exam  Mental status: The patient is alert and oriented x 3 at the time of the examination. The patient has apparent normal recent and remote memory, with an apparently normal attention span and concentration ability.   Cranial nerves: Facial symmetry is present. Speech is normal, no aphasia or dysarthria is noted. Extraocular movements are full. Visual fields are full.  Motor: The patient has good strength in all 4 extremities.  Sensory examination: Soft touch sensation is symmetric on the face, arms, and legs. There is no definite stocking pattern pinprick sensory deficit in the legs.  Coordination: The patient has good finger-nose-finger and heel-to-shin bilaterally.  Gait and station: The patient has a limping type gait on the right leg, she uses a cane for ambulation. Tandem gait was not attempted. Romberg is negative.  Reflexes: Deep tendon reflexes are symmetric, but are depressed.   MRI cervical 03/25/15:  IMPRESSION:  This is an abnormal MRI of the cervical spine showing the following: 1.   Moderate spinal stenosis at C5-C6 of 7.4 mm due to disc protrusion and uncovertebral spurring. There is mild foraminal narrowing at this level but no definite nerve root compression. The extent of spinal stenosis has progressed since the MRI dated 06/27/2012 when it measured  7.9 mm. 2.  Mild spinal stenosis at C4-C5 measuring 8.4 mm due to disc bulging, uncovertebral spurring and possibly congenital show narrowing of the spinal canal. There is no nerve root compression. This has regressed slightly compared to 06/27/2012. 3.  Small right paramedian disc protrusion at C6-C7 that has occurred since the 06/27/2012 MRI.  * MRI scan images were reviewed online. I agree with the written report.    Assessment/Plan:  1. Morbid obesity  2. Degenerative arthritis of the knees, right hip  3. Gait disorder  4. Cervical spinal stenosis  5. History of headache  The patient has total body pain, she could have fibromyalgia. The patient does have significant cervical spinal stenosis that has progressed over time. This potentially could be the etiology of some of her neck and arm discomfort, and her paresthesias on all fours. The patient will undergo nerve conduction studies of both arms, one leg and EMG of the right arm to exclude the possibility of an underlying peripheral neuropathy. The patient has in her history that she has multiple sclerosis, this likely is an erroneous entry, the patient has no evidence of multiple sclerosis. MRI evaluation of the brain has been unremarkable, no spinal cord lesions have been noted. The patient indicates that her headaches are doing well at this time. She will follow-up for the EMG evaluation. A prescription for gabapentin was given for the diffuse neuromuscular discomfort.  Jill Alexanders MD 12/25/2015 8:03 AM  [facility] [address] [place], [place] [address]  Phone [phone] Fax [phone]

## 2016-01-04 ENCOUNTER — Ambulatory Visit
Admission: RE | Admit: 2016-01-04 | Discharge: 2016-01-04 | Disposition: A | Discharge status: Home / Self Care | Payer: Medicare HMO | Source: Ambulatory Visit | Attending: Physician Assistant | Admitting: Physician Assistant

## 2016-01-04 ENCOUNTER — Inpatient Hospital Stay: Admission: RE | Admit: 2016-01-04 | Payer: Self-pay | Source: Ambulatory Visit

## 2016-01-04 DIAGNOSIS — M25551 Pain in right hip: Secondary | ICD-10-CM

## 2016-01-14 ENCOUNTER — Encounter: Payer: Medicare HMO | Admitting: Neurology

## 2016-01-15 ENCOUNTER — Encounter: Payer: Self-pay | Admitting: Neurology

## 2016-01-17 NOTE — Telephone Encounter (Signed)
Error

## 2016-02-16 ENCOUNTER — Other Ambulatory Visit: Payer: Self-pay | Admitting: Internal Medicine

## 2016-02-26 ENCOUNTER — Telehealth (INDEPENDENT_AMBULATORY_CARE_PROVIDER_SITE_OTHER): Payer: Self-pay | Admitting: Orthopaedic Surgery

## 2016-02-26 NOTE — Telephone Encounter (Signed)
Patient came in office and wanting to schedule surgery for her right hip with Dr. Ninfa Linden. Please call her as soon as you can.

## 2016-03-02 ENCOUNTER — Other Ambulatory Visit: Payer: Self-pay | Admitting: Neurology

## 2016-03-02 ENCOUNTER — Encounter: Payer: Self-pay | Admitting: Neurology

## 2016-03-02 ENCOUNTER — Ambulatory Visit (INDEPENDENT_AMBULATORY_CARE_PROVIDER_SITE_OTHER): Payer: Medicare HMO | Admitting: Neurology

## 2016-03-02 ENCOUNTER — Encounter (INDEPENDENT_AMBULATORY_CARE_PROVIDER_SITE_OTHER): Payer: Self-pay

## 2016-03-02 DIAGNOSIS — M4802 Spinal stenosis, cervical region: Secondary | ICD-10-CM

## 2016-03-02 DIAGNOSIS — R269 Unspecified abnormalities of gait and mobility: Secondary | ICD-10-CM

## 2016-03-02 DIAGNOSIS — Z0289 Encounter for other administrative examinations: Secondary | ICD-10-CM

## 2016-03-02 MED ORDER — QUETIAPINE FUMARATE 25 MG PO TABS: 25.0000 mg | ORAL_TABLET | Freq: Every day | ORAL | 3 refills | Status: AC

## 2016-03-02 MED ORDER — BACLOFEN 20 MG PO TABS: 20.0000 mg | ORAL_TABLET | Freq: Three times a day (TID) | ORAL | 3 refills | Status: DC

## 2016-03-02 NOTE — Procedures (Signed)
     HISTORY:  Rakeya Adem is a 50 year old patient with a history of morbid obesity, migraine headaches, and paresthesias involving all 4 extremities. The patient has cervical spinal stenosis with cord impingement. She is being evaluated for the sensory complaints.  NERVE CONDUCTION STUDIES:  Nerve conduction studies were performed on both upper extremities. The distal motor latencies and motor amplitudes for the median and ulnar nerves were within normal limits. The F wave latencies and nerve conduction velocities for these nerves were also normal. The sensory latencies for the median and ulnar nerves were normal.  Nerve conduction studies were performed on the right lower extremity. The distal motor latencies and motor amplitudes for the peroneal and posterior tibial nerves were within normal limits. The nerve conduction velocities for these nerves were also normal. The H reflex latency was normal. The sensory latency for the peroneal nerve was within normal limits.   EMG STUDIES:  EMG study was performed on the right upper extremity:  The first dorsal interosseous muscle reveals 2 to 4 K units with full recruitment. No fibrillations or positive waves were noted. The abductor pollicis brevis muscle reveals 2 to 4 K units with full recruitment. No fibrillations or positive waves were noted. The extensor indicis proprius muscle reveals 1 to 3 K units with full recruitment. No fibrillations or positive waves were noted. The pronator teres muscle reveals 2 to 3 K units with full recruitment. No fibrillations or positive waves were noted. The biceps muscle reveals 1 to 2 K units with full recruitment. No fibrillations or positive waves were noted. The triceps muscle reveals 2 to 3 K units with full recruitment. No fibrillations or positive waves were noted. The anterior deltoid muscle reveals 2 to 3 K units with full recruitment. No fibrillations or positive waves were  noted.   IMPRESSION:  Nerve conduction studies done on both upper extremities and on the right lower extremity were unremarkable. No evidence of a peripheral neuropathy is seen. EMG evaluation of the right upper extremity was unremarkable, without evidence of an overlying cervical radiculopathy.  Jill Alexanders MD 03/02/2016 4:58 PM  [facility] [address] [place], [place] [address]  Phone [phone] Fax [phone]

## 2016-03-02 NOTE — Progress Notes (Signed)
The patient comes in today for EMG and nerve conduction study evaluation. The patient is having cognitive side effects on the Topamax, she is having about 2 or 3 headaches a week. She has tingling in the hands and feet.  The patient will taper off of the Topamax by 50 mg every 2 weeks until she is off the medication. The back will be increased to 20 mg 3 times daily. The patient is on a beta blocker, atenolol for the headache.  The patient will be placed on Seroquel for the headache.

## 2016-03-03 ENCOUNTER — Other Ambulatory Visit (INDEPENDENT_AMBULATORY_CARE_PROVIDER_SITE_OTHER): Payer: Self-pay | Admitting: Orthopaedic Surgery

## 2016-03-03 DIAGNOSIS — M1611 Unilateral primary osteoarthritis, right hip: Secondary | ICD-10-CM

## 2016-03-03 NOTE — Progress Notes (Signed)
Please place orders in EPIC as patient is being scheduled for a pre-op appointment with the nurse for her surgery on 03/13/2016! Thank you!

## 2016-03-03 NOTE — Telephone Encounter (Signed)
I called patient and scheduled surgery. 

## 2016-03-04 ENCOUNTER — Other Ambulatory Visit (HOSPITAL_COMMUNITY): Payer: Self-pay

## 2016-03-05 ENCOUNTER — Ambulatory Visit (INDEPENDENT_AMBULATORY_CARE_PROVIDER_SITE_OTHER): Payer: Medicare HMO | Admitting: Orthopaedic Surgery

## 2016-03-05 ENCOUNTER — Encounter (HOSPITAL_COMMUNITY)
Admission: RE | Admit: 2016-03-05 | Discharge: 2016-03-05 | Disposition: A | Discharge status: Home / Self Care | Payer: Medicare HMO | Source: Ambulatory Visit | Attending: Orthopaedic Surgery | Admitting: Orthopaedic Surgery

## 2016-03-05 ENCOUNTER — Encounter (HOSPITAL_COMMUNITY): Payer: Self-pay

## 2016-03-05 DIAGNOSIS — M25551 Pain in right hip: Secondary | ICD-10-CM

## 2016-03-05 DIAGNOSIS — M1611 Unilateral primary osteoarthritis, right hip: Secondary | ICD-10-CM | POA: Diagnosis not present

## 2016-03-05 DIAGNOSIS — Z01818 Encounter for other preprocedural examination: Secondary | ICD-10-CM | POA: Diagnosis not present

## 2016-03-05 LAB — BASIC METABOLIC PANEL
Anion gap: 6 (ref 5–15)
BUN: 13 mg/dL (ref 6–20)
CHLORIDE: 107 mmol/L (ref 101–111)
CO2: 25 mmol/L (ref 22–32)
CREATININE: 0.92 mg/dL (ref 0.44–1.00)
Calcium: 9.3 mg/dL (ref 8.9–10.3)
GFR calc non Af Amer: >60 mL/min (ref >60)
Glucose, Bld: 95 mg/dL (ref 65–99)
POTASSIUM: 3.8 mmol/L (ref 3.5–5.1)
Sodium: 138 mmol/L (ref 135–145)

## 2016-03-05 LAB — CBC
HEMATOCRIT: 36.6 % (ref 36.0–46.0)
Hemoglobin: 11.9 g/dL — ABNORMAL LOW (ref 12.0–15.0)
MCH: 30.4 pg (ref 26.0–34.0)
MCHC: 32.5 g/dL (ref 30.0–36.0)
MCV: 93.4 fL (ref 78.0–100.0)
PLATELETS: 222 10*3/uL (ref 150–400)
RBC: 3.92 MIL/uL (ref 3.87–5.11)
RDW: 13.3 % (ref 11.5–15.5)
WBC: 4.1 10*3/uL (ref 4.0–10.5)

## 2016-03-05 LAB — SURGICAL PCR SCREEN
MRSA, PCR: NEGATIVE
Staphylococcus aureus: NEGATIVE

## 2016-03-05 NOTE — Progress Notes (Signed)
Brenda Odonnell is here to discuss her hip replacement surgery that is scheduled for next week. She had a significant fall early this year and had a lot of muscle strains and tears around her right hip. With continued pain as well as detrimental effects on her activities daily living, her mobility, and her quality of life we obtain an MRI to further evaluate her right hip. His MRI did show advanced arthritis of her right hip as well as tears of the rectus femoris muscle up at the hip and the external rotators. She is still having severe pain in her right hip and we discussed the signs with her before and set her up for an anterior hip replacement surgery. Today she comes in the office I can showed a hip model and explained in detail what her MRI shows as well as what the surgery involves. With thorough discussion of the risk and benefits of the surgery as well. 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 On examination of her hip she has significant pain with internal/external rotation. I gave her handout on hip replacement surgery and again we will reschedule her for surgery on 03/13/2016. We will see her back in 2 weeks a follow-up but no x-rays are needed.

## 2016-03-05 NOTE — Patient Instructions (Signed)
Brenda Odonnell  03/05/2016   Your procedure is scheduled on: Friday 03/13/2016  Report to [facility]  Entrance take [place]  elevators to 3rd floor to  Brenda Odonnell at   [place]  AM.  Call this number if you have problems the morning of surgery [phone]   Remember: ONLY 1 PERSON MAY GO WITH YOU TO SHORT STAY TO GET  READY MORNING OF Brenda Odonnell.   Do not eat food or drink liquids :After Midnight.     Take these medicines the morning of surgery with A SIP OF WATER:  Atenolol, Lyrica, Lamotrigine (Lamictal), Duloxetine (Cymbalta), Gabapentin, eye drops, and Percocet if needed                                 You may not have any metal on your body including hair pins and              piercings  Do not wear jewelry, make-up, lotions, powders or perfumes, deodorant             Do not wear nail polish.  Do not shave  48 hours prior to surgery.              Men may shave face and neck.   Do not bring valuables to the hospital. [name].  Contacts, dentures or bridgework may not be worn into surgery.  Leave suitcase in the car. After surgery it may be brought to your room.                  Please read over the following fact sheets you were given: _____________________________________________________________________             [facility] - Preparing for Surgery Before surgery, you can play an important role.  Because skin is not sterile, your skin needs to be as free of germs as possible.  You can reduce the number of germs on your skin by washing with CHG (chlorahexidine gluconate) soap before surgery.  CHG is an antiseptic cleaner which kills germs and bonds with the skin to continue killing germs even after washing. Please DO NOT use if you have an allergy to CHG or antibacterial soaps.  If your skin becomes reddened/irritated stop using the CHG and inform your nurse when you arrive at Short  Stay. Do not shave (including legs and underarms) for at least 48 hours prior to the first CHG shower.  You may shave your face/neck. Please follow these instructions carefully:  1.  Shower with CHG Soap the night before surgery and the  morning of Surgery.  2.  If you choose to wash your hair, wash your hair first as usual with your  normal  shampoo.  3.  After you shampoo, rinse your hair and body thoroughly to remove the  shampoo.                           4.  Use CHG as you would any other liquid soap.  You can apply chg directly  to the skin and wash  Gently with a scrungie or clean washcloth.  5.  Apply the CHG Soap to your body ONLY FROM THE NECK DOWN.   Do not use on face/ open                           Wound or open sores. Avoid contact with eyes, ears mouth and genitals (private parts).                       Wash face,  Genitals (private parts) with your normal soap.             6.  Wash thoroughly, paying special attention to the area where your surgery  will be performed.  7.  Thoroughly rinse your body with warm water from the neck down.  8.  DO NOT shower/wash with your normal soap after using and rinsing off  the CHG Soap.                9.  Pat yourself dry with a clean towel.            10.  Wear clean pajamas.            11.  Place clean sheets on your bed the night of your first shower and do not  sleep with pets. Day of Surgery : Do not apply any lotions/deodorants the morning of surgery.  Please wear clean clothes to the hospital/surgery center.  FAILURE TO FOLLOW THESE INSTRUCTIONS MAY RESULT IN THE CANCELLATION OF YOUR SURGERY PATIENT SIGNATURE_________________________________  NURSE SIGNATURE__________________________________  ________________________________________________________________________   Adam Phenix  An incentive spirometer is a tool that can help keep your lungs clear and active. This tool measures how well you are  filling your lungs with each breath. Taking long deep breaths may help reverse or decrease the chance of developing breathing (pulmonary) problems (especially infection) following:  A long period of time when you are unable to move or be active. BEFORE THE PROCEDURE   If the spirometer includes an indicator to show your best effort, your nurse or respiratory therapist will set it to a desired goal.  If possible, sit up straight or lean slightly forward. Try not to slouch.  Hold the incentive spirometer in an upright position. INSTRUCTIONS FOR USE  1. Sit on the edge of your bed if possible, or sit up as far as you can in bed or on a chair. 2. Hold the incentive spirometer in an upright position. 3. Breathe out normally. 4. Place the mouthpiece in your mouth and seal your lips tightly around it. 5. Breathe in slowly and as deeply as possible, raising the piston or the ball toward the top of the column. 6. Hold your breath for 3-5 seconds or for as long as possible. Allow the piston or ball to fall to the bottom of the column. 7. Remove the mouthpiece from your mouth and breathe out normally. 8. Rest for a few seconds and repeat Steps 1 through 7 at least 10 times every 1-2 hours when you are awake. Take your time and take a few normal breaths between deep breaths. 9. The spirometer may include an indicator to show your best effort. Use the indicator as a goal to work toward during each repetition. 10. After each set of 10 deep breaths, practice coughing to be sure your lungs are clear. If you have an incision (the cut made at the time of surgery),  support your incision when coughing by placing a pillow or rolled up towels firmly against it. Once you are able to get out of bed, walk around indoors and cough well. You may stop using the incentive spirometer when instructed by your caregiver.  RISKS AND COMPLICATIONS  Take your time so you do not get dizzy or light-headed.  If you are in pain,  you may need to take or ask for pain medication before doing incentive spirometry. It is harder to take a deep breath if you are having pain. AFTER USE  Rest and breathe slowly and easily.  It can be helpful to keep track of a log of your progress. Your caregiver can provide you with a simple table to help with this. If you are using the spirometer at home, follow these instructions: [place] IF:   You are having difficultly using the spirometer.  You have trouble using the spirometer as often as instructed.  Your pain medication is not giving enough relief while using the spirometer.  You develop fever of 100.5 F (38.1 C) or higher. SEEK IMMEDIATE MEDICAL CARE IF:   You cough up bloody sputum that had not been present before.  You develop fever of 102 F (38.9 C) or greater.  You develop worsening pain at or near the incision site. MAKE SURE YOU:   Understand these instructions.  Will watch your condition.  Will get help right away if you are not doing well or get worse. Document Released: 08/31/2006 Document Revised: 07/13/2011 Document Reviewed: 11/01/2006 [facility] Patient Information 2014 [place], [place].   WHAT IS A BLOOD TRANSFUSION? Blood Transfusion Information  A transfusion is the replacement of blood or some of its parts. Blood is made up of multiple cells which provide different functions.  Red blood cells carry oxygen and are used for blood loss replacement.  White blood cells fight against infection.  Platelets control bleeding.  Plasma helps clot blood.  Other blood products are available for specialized needs, such as hemophilia or other clotting disorders. BEFORE THE TRANSFUSION  Who gives blood for transfusions?   Healthy volunteers who are fully evaluated to make sure their blood is safe. This is blood bank blood. Transfusion therapy is the safest it has ever been in the practice of medicine. Before blood is taken from a donor, a  complete history is taken to make sure that person has no history of diseases nor engages in risky social behavior (examples are intravenous drug use or sexual activity with multiple partners). The donor's travel history is screened to minimize risk of transmitting infections, such as malaria. The donated blood is tested for signs of infectious diseases, such as HIV and hepatitis. The blood is then tested to be sure it is compatible with you in order to minimize the chance of a transfusion reaction. If you or a relative donates blood, this is often done in anticipation of surgery and is not appropriate for emergency situations. It takes many days to process the donated blood. RISKS AND COMPLICATIONS Although transfusion therapy is very safe and saves many lives, the main dangers of transfusion include:   Getting an infectious disease.  Developing a transfusion reaction. This is an allergic reaction to something in the blood you were given. Every precaution is taken to prevent this. The decision to have a blood transfusion has been considered carefully by your caregiver before blood is given. Blood is not given unless the benefits outweigh the risks. AFTER THE TRANSFUSION  Right  after receiving a blood transfusion, you will usually feel much better and more energetic. This is especially true if your red blood cells have gotten low (anemic). The transfusion raises the level of the red blood cells which carry oxygen, and this usually causes an energy increase.  The nurse administering the transfusion will monitor you carefully for complications. HOME CARE INSTRUCTIONS  No special instructions are needed after a transfusion. You may find your energy is better. Speak with your caregiver about any limitations on activity for underlying diseases you may have. SEEK MEDICAL CARE IF:   Your condition is not improving after your transfusion.  You develop redness or irritation at the intravenous (IV)  site. SEEK IMMEDIATE MEDICAL CARE IF:  Any of the following symptoms occur over the next 12 hours:  Shaking chills.  You have a temperature by mouth above 102 F (38.9 C), not controlled by medicine.  Chest, back, or muscle pain.  People around you feel you are not acting correctly or are confused.  Shortness of breath or difficulty breathing.  Dizziness and fainting.  You get a rash or develop hives.  You have a decrease in urine output.  Your urine turns a dark color or changes to pink, red, or brown. Any of the following symptoms occur over the next 10 days:  You have a temperature by mouth above 102 F (38.9 C), not controlled by medicine.  Shortness of breath.  Weakness after normal activity.  The white part of the eye turns yellow (jaundice).  You have a decrease in the amount of urine or are urinating less often.  Your urine turns a dark color or changes to pink, red, or brown. Document Released: 04/17/2000 Document Revised: 07/13/2011 Document Reviewed: 12/05/2007 [facility] Patient Information 2014 [place], [place].  _______________________________________________________________________ ________________________________________________________________________

## 2016-03-11 ENCOUNTER — Encounter (HOSPITAL_COMMUNITY): Payer: Self-pay | Admitting: *Deleted

## 2016-03-12 MED ORDER — CEFAZOLIN SODIUM 10 G IJ SOLR
3.0000 g | INTRAMUSCULAR | Status: AC
  Administered 2016-03-13: 3 g via INTRAVENOUS
  Filled 2016-03-12: qty 3

## 2016-03-13 ENCOUNTER — Inpatient Hospital Stay (HOSPITAL_COMMUNITY): Payer: Medicare HMO | Admitting: Anesthesiology

## 2016-03-13 ENCOUNTER — Inpatient Hospital Stay (HOSPITAL_COMMUNITY): Payer: Medicare HMO

## 2016-03-13 ENCOUNTER — Encounter (HOSPITAL_COMMUNITY)
Admission: RE | Disposition: A | Discharge status: Skilled Nursing Facility | Payer: Self-pay | Source: Ambulatory Visit | Attending: Orthopaedic Surgery

## 2016-03-13 ENCOUNTER — Encounter (HOSPITAL_COMMUNITY): Payer: Self-pay | Admitting: *Deleted

## 2016-03-13 ENCOUNTER — Inpatient Hospital Stay (HOSPITAL_COMMUNITY)
Admission: RE | Admit: 2016-03-13 | Discharge: 2016-03-16 | DRG: 470 | Disposition: A | Discharge status: Skilled Nursing Facility | Payer: Medicare HMO | Source: Ambulatory Visit | Attending: Orthopaedic Surgery | Admitting: Orthopaedic Surgery

## 2016-03-13 DIAGNOSIS — I10 Essential (primary) hypertension: Secondary | ICD-10-CM | POA: Diagnosis present

## 2016-03-13 DIAGNOSIS — Z96652 Presence of left artificial knee joint: Secondary | ICD-10-CM | POA: Diagnosis present

## 2016-03-13 DIAGNOSIS — F3181 Bipolar II disorder: Secondary | ICD-10-CM | POA: Diagnosis present

## 2016-03-13 DIAGNOSIS — M25551 Pain in right hip: Secondary | ICD-10-CM | POA: Diagnosis present

## 2016-03-13 DIAGNOSIS — W1830XA Fall on same level, unspecified, initial encounter: Secondary | ICD-10-CM | POA: Diagnosis present

## 2016-03-13 DIAGNOSIS — Z833 Family history of diabetes mellitus: Secondary | ICD-10-CM

## 2016-03-13 DIAGNOSIS — K219 Gastro-esophageal reflux disease without esophagitis: Secondary | ICD-10-CM | POA: Diagnosis present

## 2016-03-13 DIAGNOSIS — Z8342 Family history of familial hypercholesterolemia: Secondary | ICD-10-CM | POA: Diagnosis not present

## 2016-03-13 DIAGNOSIS — H409 Unspecified glaucoma: Secondary | ICD-10-CM | POA: Diagnosis present

## 2016-03-13 DIAGNOSIS — Z8269 Family history of other diseases of the musculoskeletal system and connective tissue: Secondary | ICD-10-CM | POA: Diagnosis not present

## 2016-03-13 DIAGNOSIS — M1611 Unilateral primary osteoarthritis, right hip: Secondary | ICD-10-CM | POA: Diagnosis present

## 2016-03-13 DIAGNOSIS — Z886 Allergy status to analgesic agent status: Secondary | ICD-10-CM

## 2016-03-13 DIAGNOSIS — Z9181 History of falling: Secondary | ICD-10-CM

## 2016-03-13 DIAGNOSIS — Z8249 Family history of ischemic heart disease and other diseases of the circulatory system: Secondary | ICD-10-CM | POA: Diagnosis not present

## 2016-03-13 DIAGNOSIS — Z6841 Body Mass Index (BMI) 40.0 and over, adult: Secondary | ICD-10-CM | POA: Diagnosis not present

## 2016-03-13 DIAGNOSIS — M4802 Spinal stenosis, cervical region: Secondary | ICD-10-CM | POA: Diagnosis present

## 2016-03-13 DIAGNOSIS — Z96641 Presence of right artificial hip joint: Secondary | ICD-10-CM

## 2016-03-13 DIAGNOSIS — G4730 Sleep apnea, unspecified: Secondary | ICD-10-CM | POA: Diagnosis present

## 2016-03-13 HISTORY — PX: TOTAL HIP ARTHROPLASTY: SHX124

## 2016-03-13 LAB — TYPE AND SCREEN
ABO/RH(D): O POS
Antibody Screen: NEGATIVE

## 2016-03-13 SURGERY — ARTHROPLASTY, HIP, TOTAL, ANTERIOR APPROACH
Anesthesia: General | Site: Hip | Laterality: Right

## 2016-03-13 MED ORDER — MEPERIDINE HCL 50 MG/ML IJ SOLN: 6.2500 mg | INTRAMUSCULAR | Status: DC | PRN

## 2016-03-13 MED ORDER — DEXAMETHASONE SODIUM PHOSPHATE 10 MG/ML IJ SOLN
INTRAMUSCULAR | Status: AC
  Filled 2016-03-13: qty 1

## 2016-03-13 MED ORDER — TIMOLOL MALEATE 0.5 % OP SOLN
1.0000 [drp] | Freq: Two times a day (BID) | OPHTHALMIC | Status: DC
  Administered 2016-03-13 – 2016-03-16 (×4): 1 [drp] via OPHTHALMIC
  Filled 2016-03-13: qty 5

## 2016-03-13 MED ORDER — CYCLOSPORINE 0.05 % OP EMUL
1.0000 [drp] | Freq: Two times a day (BID) | OPHTHALMIC | Status: DC
  Administered 2016-03-13 – 2016-03-16 (×6): 1 [drp] via OPHTHALMIC
  Filled 2016-03-13 (×6): qty 1

## 2016-03-13 MED ORDER — BRIMONIDINE TARTRATE 0.2 % OP SOLN
1.0000 [drp] | Freq: Two times a day (BID) | OPHTHALMIC | Status: DC
  Administered 2016-03-13 – 2016-03-16 (×6): 1 [drp] via OPHTHALMIC
  Filled 2016-03-13: qty 5

## 2016-03-13 MED ORDER — PANTOPRAZOLE SODIUM 40 MG PO TBEC
40.0000 mg | DELAYED_RELEASE_TABLET | Freq: Every day | ORAL | Status: DC
  Administered 2016-03-13 – 2016-03-16 (×4): 40 mg via ORAL
  Filled 2016-03-13 (×4): qty 1

## 2016-03-13 MED ORDER — LIDOCAINE 2% (20 MG/ML) 5 ML SYRINGE
INTRAMUSCULAR | Status: AC
  Filled 2016-03-13: qty 5

## 2016-03-13 MED ORDER — FENTANYL CITRATE (PF) 100 MCG/2ML IJ SOLN
INTRAMUSCULAR | Status: DC | PRN
  Administered 2016-03-13: 100 ug via INTRAVENOUS
  Administered 2016-03-13 (×2): 50 ug via INTRAVENOUS
  Administered 2016-03-13: 100 ug via INTRAVENOUS

## 2016-03-13 MED ORDER — ALUM & MAG HYDROXIDE-SIMETH 200-200-20 MG/5ML PO SUSP: 30.0000 mL | ORAL | Status: DC | PRN

## 2016-03-13 MED ORDER — MEPERIDINE HCL 25 MG/ML IJ SOLN: 6.2500 mg | INTRAMUSCULAR | Status: DC | PRN

## 2016-03-13 MED ORDER — PROMETHAZINE HCL 25 MG/ML IJ SOLN: 6.2500 mg | INTRAMUSCULAR | Status: DC | PRN

## 2016-03-13 MED ORDER — DOCUSATE SODIUM 100 MG PO CAPS
100.0000 mg | ORAL_CAPSULE | Freq: Two times a day (BID) | ORAL | Status: DC
  Administered 2016-03-13 – 2016-03-16 (×6): 100 mg via ORAL
  Filled 2016-03-13 (×6): qty 1

## 2016-03-13 MED ORDER — ROCURONIUM BROMIDE 10 MG/ML (PF) SYRINGE
PREFILLED_SYRINGE | INTRAVENOUS | Status: DC | PRN
  Administered 2016-03-13: 50 mg via INTRAVENOUS

## 2016-03-13 MED ORDER — STERILE WATER FOR IRRIGATION IR SOLN
Status: DC | PRN
  Administered 2016-03-13: 2000 mL

## 2016-03-13 MED ORDER — DIPHENHYDRAMINE HCL 12.5 MG/5ML PO ELIX: 12.5000 mg | ORAL_SOLUTION | ORAL | Status: DC | PRN

## 2016-03-13 MED ORDER — ROCURONIUM BROMIDE 50 MG/5ML IV SOSY
PREFILLED_SYRINGE | INTRAVENOUS | Status: AC
  Filled 2016-03-13: qty 5

## 2016-03-13 MED ORDER — SODIUM CHLORIDE 0.9 % IV SOLN
INTRAVENOUS | Status: DC
  Administered 2016-03-13 – 2016-03-15 (×4): via INTRAVENOUS

## 2016-03-13 MED ORDER — SUCCINYLCHOLINE CHLORIDE 200 MG/10ML IV SOSY
PREFILLED_SYRINGE | INTRAVENOUS | Status: DC | PRN
  Administered 2016-03-13: 200 mg via INTRAVENOUS

## 2016-03-13 MED ORDER — ALPRAZOLAM 0.25 MG PO TABS
0.2500 mg | ORAL_TABLET | Freq: Three times a day (TID) | ORAL | Status: DC | PRN
  Administered 2016-03-14: 0.25 mg via ORAL
  Filled 2016-03-13: qty 1

## 2016-03-13 MED ORDER — ATENOLOL 50 MG PO TABS
100.0000 mg | ORAL_TABLET | Freq: Every day | ORAL | Status: DC
  Administered 2016-03-15: 100 mg via ORAL
  Filled 2016-03-13 (×2): qty 4
  Filled 2016-03-13: qty 2
  Filled 2016-03-13: qty 4

## 2016-03-13 MED ORDER — HYDRALAZINE HCL 20 MG/ML IJ SOLN
INTRAMUSCULAR | Status: AC
  Filled 2016-03-13: qty 1

## 2016-03-13 MED ORDER — ZOLPIDEM TARTRATE 5 MG PO TABS: 5.0000 mg | ORAL_TABLET | Freq: Every evening | ORAL | Status: DC | PRN

## 2016-03-13 MED ORDER — METOCLOPRAMIDE HCL 5 MG PO TABS: 5.0000 mg | ORAL_TABLET | Freq: Three times a day (TID) | ORAL | Status: DC | PRN

## 2016-03-13 MED ORDER — MENTHOL 3 MG MT LOZG: 1.0000 | LOZENGE | OROMUCOSAL | Status: DC | PRN

## 2016-03-13 MED ORDER — HYDROMORPHONE HCL 1 MG/ML IJ SOLN
1.0000 mg | INTRAMUSCULAR | Status: DC | PRN
  Administered 2016-03-13: 2 mg via INTRAVENOUS
  Administered 2016-03-13: 1 mg via INTRAVENOUS
  Administered 2016-03-13 – 2016-03-14 (×5): 2 mg via INTRAVENOUS
  Administered 2016-03-15: 1 mg via INTRAVENOUS
  Filled 2016-03-13 (×7): qty 2
  Filled 2016-03-13: qty 1

## 2016-03-13 MED ORDER — ACETAMINOPHEN 325 MG PO TABS
650.0000 mg | ORAL_TABLET | Freq: Four times a day (QID) | ORAL | Status: DC | PRN
  Administered 2016-03-14 (×2): 650 mg via ORAL
  Filled 2016-03-13 (×2): qty 2

## 2016-03-13 MED ORDER — LACTATED RINGERS IV SOLN
INTRAVENOUS | Status: DC | PRN
  Administered 2016-03-13 (×3): via INTRAVENOUS

## 2016-03-13 MED ORDER — AMPHETAMINE-DEXTROAMPHETAMINE 10 MG PO TABS
30.0000 mg | ORAL_TABLET | Freq: Every day | ORAL | Status: DC
  Administered 2016-03-14 – 2016-03-16 (×3): 30 mg via ORAL
  Filled 2016-03-13 (×4): qty 3

## 2016-03-13 MED ORDER — LAMOTRIGINE 100 MG PO TABS
200.0000 mg | ORAL_TABLET | Freq: Every day | ORAL | Status: DC
  Administered 2016-03-13 – 2016-03-16 (×4): 200 mg via ORAL
  Filled 2016-03-13 (×4): qty 2

## 2016-03-13 MED ORDER — POLYETHYLENE GLYCOL 3350 17 G PO PACK: 17.0000 g | PACK | Freq: Every day | ORAL | Status: DC | PRN

## 2016-03-13 MED ORDER — LIDOCAINE 2% (20 MG/ML) 5 ML SYRINGE
INTRAMUSCULAR | Status: DC | PRN
  Administered 2016-03-13: 20 mg via INTRAVENOUS

## 2016-03-13 MED ORDER — ASPIRIN 81 MG PO CHEW
81.0000 mg | CHEWABLE_TABLET | Freq: Two times a day (BID) | ORAL | Status: DC
  Administered 2016-03-13 – 2016-03-16 (×6): 81 mg via ORAL
  Filled 2016-03-13 (×6): qty 1

## 2016-03-13 MED ORDER — SODIUM CHLORIDE 0.9 % IR SOLN
Status: DC | PRN
  Administered 2016-03-13: 1000 mL

## 2016-03-13 MED ORDER — HYDROMORPHONE HCL 1 MG/ML IJ SOLN
0.2500 mg | INTRAMUSCULAR | Status: DC | PRN
Start: 2016-03-13 — End: 2016-03-13
  Administered 2016-03-13 (×3): 0.5 mg via INTRAVENOUS

## 2016-03-13 MED ORDER — ONDANSETRON HCL 4 MG/2ML IJ SOLN
INTRAMUSCULAR | Status: DC | PRN
  Administered 2016-03-13: 4 mg via INTRAVENOUS

## 2016-03-13 MED ORDER — LIP MEDEX EX OINT
TOPICAL_OINTMENT | CUTANEOUS | Status: AC
  Administered 2016-03-13: 16:00:00
  Filled 2016-03-13: qty 7

## 2016-03-13 MED ORDER — SUCCINYLCHOLINE CHLORIDE 20 MG/ML IJ SOLN
INTRAMUSCULAR | Status: AC
  Filled 2016-03-13: qty 1

## 2016-03-13 MED ORDER — HYDROMORPHONE HCL 1 MG/ML IJ SOLN: 0.2500 mg | INTRAMUSCULAR | Status: DC | PRN

## 2016-03-13 MED ORDER — AMPHETAMINE-DEXTROAMPHETAMINE 10 MG PO TABS
10.0000 mg | ORAL_TABLET | Freq: Every day | ORAL | Status: DC
  Administered 2016-03-13 – 2016-03-16 (×4): 10 mg via ORAL
  Filled 2016-03-13 (×3): qty 1

## 2016-03-13 MED ORDER — TOPIRAMATE 100 MG PO TABS
200.0000 mg | ORAL_TABLET | Freq: Every day | ORAL | Status: DC
  Administered 2016-03-13 – 2016-03-16 (×4): 200 mg via ORAL
  Filled 2016-03-13 (×4): qty 2

## 2016-03-13 MED ORDER — FUROSEMIDE 20 MG PO TABS: 40.0000 mg | ORAL_TABLET | Freq: Every day | ORAL | Status: DC | PRN

## 2016-03-13 MED ORDER — MIDAZOLAM HCL 2 MG/2ML IJ SOLN: 0.5000 mg | Freq: Once | INTRAMUSCULAR | Status: DC | PRN

## 2016-03-13 MED ORDER — MIDAZOLAM HCL 2 MG/2ML IJ SOLN
INTRAMUSCULAR | Status: AC
  Filled 2016-03-13: qty 2

## 2016-03-13 MED ORDER — HYDROMORPHONE HCL 1 MG/ML IJ SOLN
INTRAMUSCULAR | Status: AC
  Administered 2016-03-13: 0.5 mg via INTRAVENOUS
  Filled 2016-03-13: qty 1

## 2016-03-13 MED ORDER — PROPOFOL 10 MG/ML IV BOLUS
INTRAVENOUS | Status: DC | PRN
  Administered 2016-03-13: 250 mg via INTRAVENOUS

## 2016-03-13 MED ORDER — ACETAMINOPHEN 650 MG RE SUPP: 650.0000 mg | Freq: Four times a day (QID) | RECTAL | Status: DC | PRN

## 2016-03-13 MED ORDER — LIP MEDEX EX OINT
TOPICAL_OINTMENT | CUTANEOUS | Status: AC
  Filled 2016-03-13: qty 7

## 2016-03-13 MED ORDER — FENTANYL CITRATE (PF) 100 MCG/2ML IJ SOLN
INTRAMUSCULAR | Status: AC
  Filled 2016-03-13: qty 2

## 2016-03-13 MED ORDER — PREGABALIN 75 MG PO CAPS
150.0000 mg | ORAL_CAPSULE | Freq: Two times a day (BID) | ORAL | Status: DC
  Administered 2016-03-13 – 2016-03-16 (×4): 150 mg via ORAL
  Filled 2016-03-13 (×5): qty 2

## 2016-03-13 MED ORDER — DORZOLAMIDE HCL 2 % OP SOLN
1.0000 [drp] | Freq: Two times a day (BID) | OPHTHALMIC | Status: DC
Start: 2016-03-13 — End: 2016-03-16
  Administered 2016-03-13 – 2016-03-16 (×7): 1 [drp] via OPHTHALMIC
  Filled 2016-03-13: qty 10

## 2016-03-13 MED ORDER — METOCLOPRAMIDE HCL 5 MG/ML IJ SOLN: 5.0000 mg | Freq: Three times a day (TID) | INTRAMUSCULAR | Status: DC | PRN

## 2016-03-13 MED ORDER — PROPOFOL 10 MG/ML IV BOLUS
INTRAVENOUS | Status: AC
  Filled 2016-03-13: qty 20

## 2016-03-13 MED ORDER — OXYCODONE HCL 5 MG PO TABS
5.0000 mg | ORAL_TABLET | ORAL | Status: DC | PRN
  Administered 2016-03-13: 15 mg via ORAL
  Administered 2016-03-13: 10 mg via ORAL
  Administered 2016-03-14 – 2016-03-15 (×6): 15 mg via ORAL
  Administered 2016-03-16 (×3): 10 mg via ORAL
  Filled 2016-03-13 (×4): qty 3
  Filled 2016-03-13: qty 2
  Filled 2016-03-13: qty 3
  Filled 2016-03-13 (×2): qty 2
  Filled 2016-03-13: qty 1
  Filled 2016-03-13 (×3): qty 3
  Filled 2016-03-13: qty 2

## 2016-03-13 MED ORDER — MIDAZOLAM HCL 5 MG/5ML IJ SOLN
INTRAMUSCULAR | Status: DC | PRN
  Administered 2016-03-13: 2 mg via INTRAVENOUS

## 2016-03-13 MED ORDER — CEFAZOLIN SODIUM-DEXTROSE 2-4 GM/100ML-% IV SOLN
2.0000 g | Freq: Four times a day (QID) | INTRAVENOUS | Status: AC
  Administered 2016-03-13 (×2): 2 g via INTRAVENOUS
  Filled 2016-03-13 (×2): qty 100

## 2016-03-13 MED ORDER — BACLOFEN 20 MG PO TABS
20.0000 mg | ORAL_TABLET | Freq: Three times a day (TID) | ORAL | Status: DC
Start: 2016-03-13 — End: 2016-03-16
  Administered 2016-03-13 – 2016-03-16 (×9): 20 mg via ORAL
  Filled 2016-03-13 (×9): qty 1

## 2016-03-13 MED ORDER — ONDANSETRON HCL 4 MG/2ML IJ SOLN
INTRAMUSCULAR | Status: AC
  Filled 2016-03-13: qty 2

## 2016-03-13 MED ORDER — ACETAMINOPHEN 10 MG/ML IV SOLN
INTRAVENOUS | Status: AC
  Administered 2016-03-13: 1000 mg via INTRAVENOUS
  Filled 2016-03-13: qty 100

## 2016-03-13 MED ORDER — DULOXETINE HCL 60 MG PO CPEP
60.0000 mg | ORAL_CAPSULE | Freq: Every day | ORAL | Status: DC
Start: 2016-03-13 — End: 2016-03-16
  Administered 2016-03-13 – 2016-03-16 (×4): 60 mg via ORAL
  Filled 2016-03-13 (×4): qty 1

## 2016-03-13 MED ORDER — TRANEXAMIC ACID 1000 MG/10ML IV SOLN
1000.0000 mg | INTRAVENOUS | Status: AC
  Administered 2016-03-13: 1000 mg via INTRAVENOUS
  Filled 2016-03-13: qty 1100

## 2016-03-13 MED ORDER — ONDANSETRON HCL 4 MG/2ML IJ SOLN: 4.0000 mg | Freq: Four times a day (QID) | INTRAMUSCULAR | Status: DC | PRN

## 2016-03-13 MED ORDER — PHENOL 1.4 % MT LIQD: 1.0000 | OROMUCOSAL | Status: DC | PRN

## 2016-03-13 MED ORDER — ACETAMINOPHEN 10 MG/ML IV SOLN
1000.0000 mg | Freq: Once | INTRAVENOUS | Status: AC
  Administered 2016-03-13: 1000 mg via INTRAVENOUS

## 2016-03-13 MED ORDER — BRIMONIDINE TARTRATE-TIMOLOL 0.2-0.5 % OP SOLN: 1.0000 [drp] | Freq: Two times a day (BID) | OPHTHALMIC | Status: DC

## 2016-03-13 MED ORDER — SUGAMMADEX SODIUM 200 MG/2ML IV SOLN
INTRAVENOUS | Status: DC | PRN
  Administered 2016-03-13: 200 mg via INTRAVENOUS

## 2016-03-13 MED ORDER — ATENOLOL 100 MG PO TABS
100.0000 mg | ORAL_TABLET | Freq: Once | ORAL | Status: AC
  Administered 2016-03-13: 100 mg via ORAL
  Filled 2016-03-13: qty 1

## 2016-03-13 MED ORDER — ONDANSETRON HCL 4 MG PO TABS: 4.0000 mg | ORAL_TABLET | Freq: Four times a day (QID) | ORAL | Status: DC | PRN

## 2016-03-13 MED ORDER — DEXAMETHASONE SODIUM PHOSPHATE 10 MG/ML IJ SOLN
INTRAMUSCULAR | Status: DC | PRN
  Administered 2016-03-13: 10 mg via INTRAVENOUS

## 2016-03-13 MED ORDER — SUGAMMADEX SODIUM 200 MG/2ML IV SOLN
INTRAVENOUS | Status: AC
  Filled 2016-03-13: qty 2

## 2016-03-13 SURGICAL SUPPLY — 35 items
BAG ZIPLOCK 12X15 (MISCELLANEOUS) IMPLANT
BENZOIN TINCTURE PRP APPL 2/3 (GAUZE/BANDAGES/DRESSINGS) IMPLANT
BLADE SAW SGTL 18X1.27X75 (BLADE) ×2 IMPLANT
CAPT HIP TOTAL 2 ×2 IMPLANT
CELLS DAT CNTRL 66122 CELL SVR (MISCELLANEOUS) ×1 IMPLANT
CLOTH BEACON ORANGE TIMEOUT ST (SAFETY) ×2 IMPLANT
DRAPE STERI IOBAN 125X83 (DRAPES) ×2 IMPLANT
DRAPE U-SHAPE 47X51 STRL (DRAPES) ×4 IMPLANT
DRSG AQUACEL AG ADV 3.5X10 (GAUZE/BANDAGES/DRESSINGS) ×2 IMPLANT
DURAPREP 26ML APPLICATOR (WOUND CARE) ×2 IMPLANT
ELECT REM PT RETURN 9FT ADLT (ELECTROSURGICAL) ×2
ELECTRODE REM PT RTRN 9FT ADLT (ELECTROSURGICAL) ×1 IMPLANT
GAUZE XEROFORM 1X8 LF (GAUZE/BANDAGES/DRESSINGS) ×2 IMPLANT
GLOVE BIO SURGEON STRL SZ7.5 (GLOVE) ×2 IMPLANT
GLOVE BIOGEL PI IND STRL 8 (GLOVE) ×2 IMPLANT
GLOVE BIOGEL PI INDICATOR 8 (GLOVE) ×2
GLOVE ECLIPSE 8.0 STRL XLNG CF (GLOVE) ×2 IMPLANT
GOWN STRL REUS W/TWL XL LVL3 (GOWN DISPOSABLE) ×4 IMPLANT
HANDPIECE INTERPULSE COAX TIP (DISPOSABLE) ×1
HOLDER FOLEY CATH W/STRAP (MISCELLANEOUS) ×2 IMPLANT
PACK ANTERIOR HIP CUSTOM (KITS) ×2 IMPLANT
RTRCTR WOUND ALEXIS 18CM MED (MISCELLANEOUS) ×2
SET HNDPC FAN SPRY TIP SCT (DISPOSABLE) ×1 IMPLANT
SPONGE LAP 18X18 X RAY DECT (DISPOSABLE) ×2 IMPLANT
STAPLER VISISTAT 35W (STAPLE) ×2 IMPLANT
STRIP CLOSURE SKIN 1/2X4 (GAUZE/BANDAGES/DRESSINGS) IMPLANT
SUT ETHIBOND NAB CT1 #1 30IN (SUTURE) ×2 IMPLANT
SUT MNCRL AB 4-0 PS2 18 (SUTURE) IMPLANT
SUT VIC AB 0 CT1 36 (SUTURE) ×2 IMPLANT
SUT VIC AB 1 CT1 36 (SUTURE) ×2 IMPLANT
SUT VIC AB 2-0 CT1 27 (SUTURE) ×3
SUT VIC AB 2-0 CT1 TAPERPNT 27 (SUTURE) ×3 IMPLANT
TRAY FOLEY CATH 14FRSI W/METER (CATHETERS) ×2 IMPLANT
TRAY FOLEY W/METER SILVER 16FR (SET/KITS/TRAYS/PACK) IMPLANT
YANKAUER SUCT BULB TIP NO VENT (SUCTIONS) ×2 IMPLANT

## 2016-03-13 NOTE — Op Note (Signed)
[name], [name]:  [id number]  MEDICAL RECORD NO.:  [id number]  LOCATION:  [facility]                         FACILITY:  [facility]  PHYSICIAN:  Lind Guest. Ninfa Linden, M.D.DATE OF BIRTH:  March 22, 1966  DATE OF PROCEDURE:  03/13/2016 DATE OF DISCHARGE:                              OPERATIVE REPORT   PREOPERATIVE DIAGNOSIS:  Primary osteoarthritis of right hip.  POSTOPERATIVE DIAGNOSIS:  Primary osteoarthritis of right hip.  PROCEDURE:  Right total hip arthroplasty through direct anterior approach.  IMPLANTS:  DePuy Sector Gription acetabular component size 50, a single screw, size 32+ 0 neutral polyethylene liner, size 11 Corail femoral component with standard offset, size 32+ 1 ceramic hip ball.  SURGEON:  Lind Guest. Ninfa Linden, M.D.  ASSISTANT:  Erskine Emery, PA-C.  ANESTHESIA:  General.  ANTIBIOTICS:  3 g of IV Ancef.  BLOOD LOSS:  Less than 500 mL.  COMPLICATIONS:  None.  INDICATIONS:  Brenda Odonnell is a 50 year old morbidly obese female with debilitating right hip pain for less than a year now.  She originally denied any type of hip discomfort until she had a mechanical fall.  She had significant pain in that right hip after the fall and it just kept hurting her enough.  X-rays did show little bit arthritis in the hip, but due to the severity of her pain, they did obtain an MRI.  The MRI confirms severe cartilage loss and denuding of the bone of the right hip.  She also had tears of the hip external rotators and the rectus femoris, suggesting acute injury as well.  I told her that most likely the injury did exasperate her arthritis.  She understands that it did not cause her arthritis, but certainly has aggravated things.  Now, her pain is daily, it is 10/10.  It has detrimentally affected her activities of daily living, her quality of life, and her mobility to the point that she does wish to proceed with a total hip arthroplasty.   She understands the risk of acute blood loss anemia, nerve and vessel injury, fracture, infection, dislocation, DVT.  She understands that all these risks are high given her morbid obesity.  She understands our goals are decreased pain, improved mobility, and overall improved quality of life.  PROCEDURE DESCRIPTION:  After informed consent was obtained, appropriate right hip was marked.  She was brought to the operating room, where general anesthesia was obtained while she was on her stretcher.  Foley catheter was placed and both feet had traction boots applied to them. Next, she was placed supine on the Hana fracture table with the perineal post in place, and both legs in inline skeletal traction devices, but no traction applied.  Her right operative hip was then prepped and draped with DuraPrep and sterile drapes.  Time-out was called and she was identified as correct patient and correct right hip.  I then made an incision just inferior and posterior to the anterior superior iliac spine and carried this longitudinally down the leg.  We dissected down the tensor fascia lata muscle.  The tensor fascia was then divided longitudinally, so we could proceed with direct anterior approach to the hip.  We identified and cauterized  the circumflex vessels and then identified the hip capsule.  I opened up the hip capsule, finding a moderate joint effusion and significant periarticular osteophytes.  I placed Cobra retractors on the medial and lateral femoral neck, and then made our femoral neck cut with an oscillating saw just proximal to the lesser trochanter and completed this with on osteotome.  We placed the corkscrew guide in the femoral head and removed the femoral head in its entirety and found to be significantly devoid of cartilage.  We found periarticular osteophytes around the acetabulum, and we removed these as well.  We then placed a bent Hohmann over the medial acetabular rim  and released transverse acetabular ligament.  I removed the remnants of acetabular labrum, then began reaming under direct visualization from a size 43 reamer up to a size 50 with all reamers under direct visualization, and the last reamer was also placed under direct fluoroscopy, so we could obtain our depth of reaming, our inclination, and anteversion.  Once I was pleased with this, I placed the real DePuy Sector Gription acetabular component size 50, a single screw, and a 32+ 0 polyethylene liner for that size of acetabular component.  Attention was then turned to the femur.  With the leg externally rotated to 120 degrees, extended and adducted, we were able to place a Mueller retractor medially and a Hohmann retractor around the greater trochanter.  We released the lateral joint capsule, the piriformis, and used a box cutting osteotome to enter the femoral canal and a rongeur to lateralize.  We then began broaching from a size 8 broach using the Corail broaching system up to a size 11.  With the size 11 in place, we trialed a standard offset femoral neck and a 32+ 1 hip ball.  We brought the leg back over and up with traction and internal rotation reducing the pelvis, and she was slightly long, but I will appreciate her stability and good range of motion.  I felt, given the valgus deformity of her right knee as well as her obesity, that we should go with a little bit extra length.  We then dislocated the hip and removed the trial components.  We were able to place the real Corail femoral component size 11 and the real 32+ 1 ceramic hip ball.  Again reduced in the acetabulum was stable.  We then copiously irrigated the soft tissue with normal saline solution using pulsatile lavage.  We did not close any type of joint capsule.  We did close the arthrotomy with interrupted #1 Vicryl suture followed by 0 Vicryl in the deep tissue, 2-0 Vicryl in the subcutaneous tissue, and interrupted  staples on the skin.  Xeroform and well-padded sterile dressing including Aquacel was applied.  She was taken off the Hana table, awakened, extubated, and taken to the recovery room in stable condition.  All final counts were correct.  There were no complications noted.  Of note, Erskine Emery, PA-C assisted in the entire case.  His assistance was crucial for facilitating all aspects of this case.     Lind Guest. Ninfa Linden, M.D.     CYB/MEDQ  D:  03/13/2016  T:  03/13/2016  Job:  [id number]

## 2016-03-13 NOTE — Brief Op Note (Signed)
03/13/2016  11:12 AM  PATIENT:  Brenda Odonnell  50 y.o. female  PRE-OPERATIVE DIAGNOSIS:  right hip osteoarthritis  POST-OPERATIVE DIAGNOSIS:  right hip osteoarthritis  PROCEDURE:  Procedure(s): RIGHT TOTAL HIP ARTHROPLASTY ANTERIOR APPROACH (Right)  SURGEON:  Surgeon(s) and Role:    * Mcarthur Rossetti, MD - Primary  PHYSICIAN ASSISTANT: Benita Stabile, PA-C  ANESTHESIA:   general  EBL:  Total I/O In: 1000 [I.V.:1000] Out: 800 [Urine:200; Blood:600]  COUNTS:  YES  DICTATION: .Other Dictation: Dictation Number [id number]  PLAN OF CARE: Admit to inpatient   PATIENT DISPOSITION:  PACU - hemodynamically stable.   Delay start of Pharmacological VTE agent (>24hrs) due to surgical blood loss or risk of bleeding: no

## 2016-03-13 NOTE — NC FL2 (Signed)
[place] LEVEL OF CARE SCREENING TOOL     IDENTIFICATION  Patient Name: Brenda Odonnell Birthdate: [birth year] Sex: female Admission Date (Current Location): 03/13/2016  [facility] and [place] Number:  Herbalist and Address:  [facility],  [place], [place]      Provider Number: [id number]  Attending Physician Name and Address:  Mcarthur Rossetti, *  Relative Name and Phone Number:       Current Level of Care: Hospital Recommended Level of Care: [place] Prior Approval Number:    Date Approved/Denied:   PASRR Number: [id number]  Discharge Plan: SNF    Current Diagnoses: Patient Active Problem List   Diagnosis Date Noted  . Unilateral primary osteoarthritis, right hip 03/13/2016  . Status post total replacement of right hip 03/13/2016  . Essential hypertension 05/26/2015  . Shingles 12/11/2014  . Osteoarthritis of left knee 11/23/2014  . Status post total left knee replacement 11/23/2014  . Dysphagia, pharyngoesophageal phase 09/07/2014  . Spinal stenosis in cervical region 09/07/2014  . BV (bacterial vaginosis) 04/03/2014  . Superficial fungus infection of skin 04/02/2014  . Candida vaginitis 04/02/2014  . Pelvic pain in female 02/13/2014  . Abnormality of gait 12/11/2013  . Urinary incontinence 12/11/2013  . Memory loss 12/11/2013  . Drowsiness 12/07/2012  . Dizziness and giddiness 12/07/2012  . Major depressive disorder, recurrent episode, severe ([place]) 12/01/2012  . Overdose 12/01/2012  . Urinary urgency 10/12/2012  . Carbuncle 10/12/2012  . Headache 05/19/2012  . Spinal stenosis   . Glaucoma   . Carpal tunnel syndrome     Orientation RESPIRATION BLADDER Height & Weight     Self, Time, Situation, Place  Normal Continent, Indwelling catheter Weight: 293 lb (132.9 kg) Height:  5\' 6"  (167.6 cm)  BEHAVIORAL SYMPTOMS/MOOD NEUROLOGICAL BOWEL NUTRITION STATUS  Other (Comment) (no  behaviors)   Continent Diet  AMBULATORY STATUS COMMUNICATION OF NEEDS Skin   Limited Assist Verbally Surgical wounds                       Personal Care Assistance Level of Assistance  Bathing, Feeding, Dressing Bathing Assistance: Limited assistance Feeding assistance: Independent Dressing Assistance: Limited assistance     Functional Limitations Info  Sight, Hearing, Speech Sight Info: Adequate Hearing Info: Adequate Speech Info: Adequate    SPECIAL CARE FACTORS FREQUENCY  PT (By licensed PT), OT (By licensed OT)     PT Frequency: 5x wk OT Frequency: 5x wk            Contractures Contractures Info: Not present    Additional Factors Info  Code Status Code Status Info: Full Code             Current Medications (03/13/2016):  This is the current hospital active medication list Current Facility-Administered Medications  Medication Dose Route Frequency Provider Last Rate Last Dose  . 0.9 %  sodium chloride infusion   Intravenous Continuous Mcarthur Rossetti, MD      . acetaminophen (TYLENOL) tablet 650 mg  650 mg Oral Q6H PRN Mcarthur Rossetti, MD       Or  . acetaminophen (TYLENOL) suppository 650 mg  650 mg Rectal Q6H PRN Mcarthur Rossetti, MD      . ALPRAZolam Duanne Moron) tablet 0.25 mg  0.25 mg Oral TID PRN Mcarthur Rossetti, MD      . alum & mag hydroxide-simeth (MAALOX/MYLANTA) 200-200-20 MG/5ML suspension 30 mL  30 mL Oral  Q4H PRN Mcarthur Rossetti, MD      . amphetamine-dextroamphetamine (ADDERALL) tablet 10 mg  10 mg Oral Q1200 Mcarthur Rossetti, MD      . Derrill Memo ON 03/14/2016] amphetamine-dextroamphetamine (ADDERALL) tablet 30 mg  30 mg Oral Daily Mcarthur Rossetti, MD      . aspirin chewable tablet 81 mg  81 mg Oral BID Mcarthur Rossetti, MD      . atenolol (TENORMIN) tablet 100 mg  100 mg Oral Daily Mcarthur Rossetti, MD      . baclofen (LIORESAL) tablet 20 mg  20 mg Oral TID Mcarthur Rossetti, MD      .  brimonidine (ALPHAGAN) 0.2 % ophthalmic solution 1 drop  1 drop Both Eyes BID Mcarthur Rossetti, MD       And  . timolol (TIMOPTIC) 0.5 % ophthalmic solution 1 drop  1 drop Both Eyes BID Mcarthur Rossetti, MD      . ceFAZolin (ANCEF) IVPB 2g/100 mL premix  2 g Intravenous Q6H Mcarthur Rossetti, MD      . cycloSPORINE (RESTASIS) 0.05 % ophthalmic emulsion 1 drop  1 drop Both Eyes BID Mcarthur Rossetti, MD      . diphenhydrAMINE (BENADRYL) 12.5 MG/5ML elixir 12.5-25 mg  12.5-25 mg Oral Q4H PRN Mcarthur Rossetti, MD      . docusate sodium (COLACE) capsule 100 mg  100 mg Oral BID Mcarthur Rossetti, MD      . dorzolamide (TRUSOPT) 2 % ophthalmic solution 1 drop  1 drop Both Eyes BID Mcarthur Rossetti, MD      . DULoxetine (CYMBALTA) DR capsule 60 mg  60 mg Oral Daily Mcarthur Rossetti, MD      . furosemide (LASIX) tablet 40 mg  40 mg Oral Daily PRN Mcarthur Rossetti, MD      . HYDROmorphone (DILAUDID) injection 1-2 mg  1-2 mg Intravenous Q2H PRN Mcarthur Rossetti, MD   1 mg at 03/13/16 1324  . lamoTRIgine (LAMICTAL) tablet 200 mg  200 mg Oral Daily Mcarthur Rossetti, MD      . lip balm (CARMEX) ointment           . menthol-cetylpyridinium (CEPACOL) lozenge 3 mg  1 lozenge Oral PRN Mcarthur Rossetti, MD       Or  . phenol (CHLORASEPTIC) mouth spray 1 spray  1 spray Mouth/Throat PRN Mcarthur Rossetti, MD      . metoCLOPramide (REGLAN) tablet 5-10 mg  5-10 mg Oral Q8H PRN Mcarthur Rossetti, MD       Or  . metoCLOPramide (REGLAN) injection 5-10 mg  5-10 mg Intravenous Q8H PRN Mcarthur Rossetti, MD      . ondansetron [facility]) tablet 4 mg  4 mg Oral Q6H PRN Mcarthur Rossetti, MD       Or  . ondansetron [facility]) injection 4 mg  4 mg Intravenous Q6H PRN Mcarthur Rossetti, MD      . oxyCODONE (Oxy IR/ROXICODONE) immediate release tablet 5-15 mg  5-15 mg Oral Q3H PRN Mcarthur Rossetti, MD   10 mg at 03/13/16 1324  .  pantoprazole (PROTONIX) EC tablet 40 mg  40 mg Oral Daily Mcarthur Rossetti, MD      . polyethylene glycol (MIRALAX / GLYCOLAX) packet 17 g  17 g Oral Daily PRN Mcarthur Rossetti, MD      . pregabalin (LYRICA) capsule 150 mg  150 mg Oral BID Lind Guest  Ninfa Linden, MD      . topiramate (TOPAMAX) tablet 200 mg  200 mg Oral Daily Mcarthur Rossetti, MD      . zolpidem [facility]) tablet 5 mg  5 mg Oral QHS PRN Mcarthur Rossetti, MD         Discharge Medications: Please see discharge summary for a list of discharge medications.  Relevant Imaging Results:  Relevant Lab Results:   Additional Information SS # [id number]  Mendy Lapinsky, Randall An, LCSW

## 2016-03-13 NOTE — Anesthesia Preprocedure Evaluation (Addendum)
Anesthesia Evaluation  Patient identified by MRN, date of birth, ID band Patient awake    Reviewed: Allergy & Precautions, NPO status , Patient's Chart, lab work & pertinent test results  History of Anesthesia Complications Negative for: history of anesthetic complications  Airway Mallampati: II  TM Distance: >3 FB Neck ROM: Full    Dental  (+) Dental Advisory Given, Missing   Pulmonary sleep apnea (doesn't use CPAP) ,    breath sounds clear to auscultation       Cardiovascular hypertension, Pt. on medications and Pt. on home beta blockers (-) angina Rhythm:Regular Rate:Normal  '14 ECHO:  EF 65-70%, valves OK   Neuro/Psych Depression Bipolar Disorder    GI/Hepatic Neg liver ROS, GERD  Medicated and Controlled,  Endo/Other  Morbid obesity  Renal/GU negative Renal ROS     Musculoskeletal  (+) Arthritis , Osteoarthritis,    Abdominal (+) + obese,   Peds  Hematology negative hematology ROS (+)   Anesthesia Other Findings   Reproductive/Obstetrics                            Anesthesia Physical Anesthesia Plan  ASA: III  Anesthesia Plan: General   Post-op Pain Management:    Induction: Intravenous  Airway Management Planned: Oral ETT  Additional Equipment:   Intra-op Plan:   Post-operative Plan: Extubation in OR  Informed Consent:   Dental advisory given  Plan Discussed with: CRNA and Surgeon  Anesthesia Plan Comments: (Plan routine monitors, GETA Pt refuses SAB)        Anesthesia Quick Evaluation

## 2016-03-13 NOTE — Progress Notes (Signed)
Received preoperative confirmation of need for nocturnal CPAP/BiPAP. Spoke with patient, who denies the use of CPAP at home and does not wish to use one while in the hospital. She states she has not needed one at home in about 10 months. No order placed due to patient noncompliance.

## 2016-03-13 NOTE — Addendum Note (Signed)
Addendum  created 03/13/16 1600 by Annye Asa, MD   Sign clinical note

## 2016-03-13 NOTE — Anesthesia Postprocedure Evaluation (Addendum)
Anesthesia Post Note  Patient: Brenda Odonnell  Procedure(s) Performed: Procedure(s) (LRB): RIGHT TOTAL HIP ARTHROPLASTY ANTERIOR APPROACH (Right)  Patient location during evaluation: PACU Anesthesia Type: General Level of consciousness: oriented, sedated and patient cooperative Pain control: pain improving. Vital Signs Assessment: post-procedure vital signs reviewed and stable Respiratory status: spontaneous breathing, respiratory function stable, patient connected to nasal cannula oxygen and nonlabored ventilation Cardiovascular status: blood pressure returned to baseline and stable Postop Assessment: no signs of nausea or vomiting Anesthetic complications: no    Last Vitals:  Vitals:   03/13/16 1450 03/13/16 1548  BP: (!) 106/56 (!) 98/48  Pulse: (!) 54 62  Resp: 15 15  Temp:  36.7 C    Last Pain:  Vitals:   03/13/16 1548  TempSrc: Oral  PainSc:                  Cheryle Dark,E. Okley Magnussen

## 2016-03-13 NOTE — Clinical Social Work Placement (Signed)
   CLINICAL SOCIAL WORK PLACEMENT  NOTE  Date:  03/13/2016  Patient Details  Name: Brenda Odonnell MRN: [id number] Date of Birth: 1965-05-13  Clinical Social Work is seeking post-discharge placement for this patient at the [place] level of care (*CSW will initial, date and re-position this form in  chart as items are completed):      Patient/family provided with [place] Work Department's list of facilities offering this level of care within the geographic area requested by the patient (or if unable, by the patient's family).  Yes   Patient/family informed of their freedom to choose among providers that offer the needed level of care, that participate in Medicare, Medicaid or managed care program needed by the patient, have an available bed and are willing to accept the patient.  Yes   Patient/family informed of [facility]'s ownership interest in [facility] and [facility], as well as of the fact that they are under no obligation to receive care at these facilities.  PASRR submitted to EDS on       PASRR number received on       Existing PASRR number confirmed on 03/13/16     FL2 transmitted to all facilities in geographic area requested by pt/family on 03/13/16     FL2 transmitted to all facilities within larger geographic area on       Patient informed that his/her managed care company has contracts with or will negotiate with certain facilities, including the following:            Patient/family informed of bed offers received.  Patient chooses bed at       Physician recommends and patient chooses bed at      Patient to be transferred to   on  .  Patient to be transferred to facility by       Patient family notified on   of transfer.  Name of family member notified:        PHYSICIAN       Additional Comment:    _______________________________________________ Luretha Rued, Joanna 03/13/2016, 2:43  PM

## 2016-03-13 NOTE — Transfer of Care (Signed)
Immediate Anesthesia Transfer of Care Note  Patient: Brenda Odonnell  Procedure(s) Performed: Procedure(s): RIGHT TOTAL HIP ARTHROPLASTY ANTERIOR APPROACH (Right)  Patient Location: PACU  Anesthesia Type:General  Level of Consciousness:  sedated, patient cooperative and responds to stimulation  Airway & Oxygen Therapy:Patient Spontanous Breathing and Patient connected to face mask oxgen  Post-op Assessment:  Report given to PACU RN and Post -op Vital signs reviewed and stable  Post vital signs:  Reviewed and stable  Last Vitals:  Vitals:   03/13/16 0748 03/13/16 0818  BP: (!) 142/76   Pulse: (!) 57 66  Resp: 18   Temp: 123456 C     Complications: No apparent anesthesia complications

## 2016-03-13 NOTE — Anesthesia Procedure Notes (Signed)
Procedure Name: Intubation Date/Time: 03/13/2016 9:35 AM Performed by: Talbot Grumbling Pre-anesthesia Checklist: Patient identified, Emergency Drugs available, Suction available and Patient being monitored Patient Re-evaluated:Patient Re-evaluated prior to inductionOxygen Delivery Method: Circle system utilized Preoxygenation: Pre-oxygenation with 100% oxygen Intubation Type: IV induction Ventilation: Mask ventilation without difficulty Laryngoscope Size: Mac and 3 Grade View: Grade II Tube type: Oral Tube size: 7.5 mm Number of attempts: 1 Airway Equipment and Method: Stylet Placement Confirmation: ETT inserted through vocal cords under direct vision,  positive ETCO2 and breath sounds checked- equal and bilateral Secured at: 22 cm Tube secured with: Tape Dental Injury: Teeth and Oropharynx as per pre-operative assessment

## 2016-03-13 NOTE — H&P (Signed)
TOTAL HIP ADMISSION H&P  Patient is admitted for right total hip arthroplasty.  Subjective:  Chief Complaint: right hip pain  HPI: Brenda Odonnell, 50 y.o. female, has a history of pain and functional disability in the right hip(s) due to trauma and arthritis and patient has failed non-surgical conservative treatments for greater than 12 weeks to include NSAID's and/or analgesics, flexibility and strengthening excercises, use of assistive devices, weight reduction as appropriate and activity modification.  Onset of symptoms was abrupt starting earlier this year after a mechanical fall years ago with rapidlly worsening course since that time.The patient noted no past surgery on the right hip(s).  Patient currently rates pain in the right hip at 10 out of 10 with activity. Patient has night pain, worsening of pain with activity and weight bearing, trendelenberg gait, pain that interfers with activities of daily living and pain with passive range of motion. Patient has evidence of subchondral cysts, subchondral sclerosis, periarticular osteophytes and joint space narrowing by imaging studies. This condition presents safety issues increasing the risk of falls.  There is no current active infection.  Patient Active Problem List   Diagnosis Date Noted  . Unilateral primary osteoarthritis, right hip 03/13/2016  . Essential hypertension 05/26/2015  . Shingles 12/11/2014  . Osteoarthritis of left knee 11/23/2014  . Status post total left knee replacement 11/23/2014  . Dysphagia, pharyngoesophageal phase 09/07/2014  . Spinal stenosis in cervical region 09/07/2014  . BV (bacterial vaginosis) 04/03/2014  . Superficial fungus infection of skin 04/02/2014  . Candida vaginitis 04/02/2014  . Pelvic pain in female 02/13/2014  . Abnormality of gait 12/11/2013  . Urinary incontinence 12/11/2013  . Memory loss 12/11/2013  . Drowsiness 12/07/2012  . Dizziness and giddiness 12/07/2012  . Major depressive  disorder, recurrent episode, severe ([place]) 12/01/2012  . Overdose 12/01/2012  . Urinary urgency 10/12/2012  . Carbuncle 10/12/2012  . Headache 05/19/2012  . Spinal stenosis   . Glaucoma   . Carpal tunnel syndrome    Past Medical History:  Diagnosis Date  . Arthritis   . Arthritis   . Bipolar 2 disorder ([place])   . Carpal tunnel syndrome   . Depression   . GERD (gastroesophageal reflux disease)   . Glaucoma   . Glaucoma   . Hypertension   . Leiomyoma of uterus, unspecified   . Migraine   . Obesity   . Sinus infection   . Sleep apnea   . Spinal stenosis   . Spinal stenosis in cervical region 09/07/2014   C5-6 level    Past Surgical History:  Procedure Laterality Date  . ABDOMINAL HYSTERECTOMY    . BREAST REDUCTION SURGERY    . CARPAL TUNNEL RELEASE Bilateral   . CESAREAN SECTION    . KNEE SURGERY Left    arthroscopic  . MYELOGRAM    . TOTAL KNEE ARTHROPLASTY Left 11/23/2014   Procedure: LEFT TOTAL KNEE ARTHROPLASTY;  Surgeon: Mcarthur Rossetti, MD;  Location: [facility];  Service: Orthopedics;  Laterality: Left;    No prescriptions prior to admission.   Allergies  Allergen Reactions  . Ibuprofen Nausea And Vomiting    Due to [place] History  Substance Use Topics  . Smoking status: Never Smoker  . Smokeless tobacco: Never Used  . Alcohol use Yes     Comment: socially, red wine    Family History  Problem Relation Age of Onset  . Hypertension Father   . Hyperlipidemia Father   . Fibromyalgia Mother   .  Diabetes Mother   . Healthy Sister   . Healthy Brother   . Hypertension       Review of Systems  Musculoskeletal: Positive for joint pain.  All other systems reviewed and are negative.   Objective:  Physical Exam  Constitutional: She is oriented to person, place, and time. She appears well-developed and well-nourished.  HENT:  Head: Normocephalic and atraumatic.  Eyes: EOM are normal. Pupils are equal, round, and reactive to light.  Neck:  Normal range of motion. Neck supple.  Cardiovascular: Normal rate and regular rhythm.   Respiratory: Breath sounds normal.  GI: Soft. Bowel sounds are normal.  Musculoskeletal:       Right hip: She exhibits decreased range of motion, decreased strength and tenderness.  Neurological: She is alert and oriented to person, place, and time.  Skin: Skin is warm and dry.  Psychiatric: She has a normal mood and affect.    Vital signs in last 24 hours:    Labs:   Estimated body mass index is 47.36 kg/m as calculated from the following:   Height as of 03/05/16: 5\' 6"  (1.676 m).   Weight as of 03/05/16: 293 lb 6.4 oz (133.1 kg).   Imaging Review Plain radiographs demonstrate severe degenerative joint disease of the right hip(s). The bone quality appears to be excellent for age and reported activity level.  Assessment/Plan:  End stage arthritis, right hip(s)  The patient history, physical examination, clinical judgement of the provider and imaging studies are consistent with end stage degenerative joint disease of the right hip(s) and total hip arthroplasty is deemed medically necessary. The treatment options including medical management, injection therapy, arthroscopy and arthroplasty were discussed at length. The risks and benefits of total hip arthroplasty were presented and reviewed. The risks due to aseptic loosening, infection, stiffness, dislocation/subluxation,  thromboembolic complications and other imponderables were discussed.  The patient acknowledged the explanation, agreed to proceed with the plan and consent was signed. Patient is being admitted for inpatient treatment for surgery, pain control, PT, OT, prophylactic antibiotics, VTE prophylaxis, progressive ambulation and ADL's and discharge planning.The patient is planning to be discharged home with home health services vs short-term skilled nursing.

## 2016-03-13 NOTE — Clinical Social Work Note (Signed)
Clinical Social Work Assessment  Patient Details  Name: Brenda Odonnell MRN: [id number] Date of Birth: 08-May-1965  Date of referral:  03/13/16               Reason for consult:  Discharge Planning                Permission sought to share information with:  Chartered certified accountant granted to share information::  Yes, Verbal Permission Granted  Name::        Agency::     Relationship::     Contact Information:     Housing/Transportation Living arrangements for the past 2 months:  Single Family Home Source of Information:  Patient Patient Interpreter Needed:  None Criminal Activity/Legal Involvement Pertinent to Current Situation/Hospitalization:  No - Comment as needed Significant Relationships:  Adult Children, Parents Lives with:  Self Do you feel safe going back to the place where you live?  No Need for family participation in patient care:  No (Coment)  Care giving concerns:  Pt's care cannot be managed at home following hospital d/c.    Social Worker assessment / plan: Pt hospitalized on 03/13/16 for pre planned right total hip arthroplasty, from home. CSW met with pt to assist with d/c planning. Pt hopes to have ST Rehab following hospital d/c. PT eval / recommendations are pending. Pt has [employer] which requires prior authorization. Pt is aware, in order to have insurance assist with rehab cost, PT will need to recommend rehab placement and [employer] will need to authorize placement. [employer] is closed on weekend. CSW will request authorization, on Monday, if PT recommends rehab placement.   Employment status:  Disabled (Comment on whether or not currently receiving Disability) Insurance information:  Managed Medicare PT Recommendations:  Not assessed at this time Information / Referral to community resources:  [place]  Patient/Family's Response to care:  Pt requesting rehab at d/c.  Patient/Family's Understanding of and Emotional  Response to Diagnosis, Current Treatment, and Prognosis:  Pt is pleased surgery is over and all went well. " I feel better than I did  after my last surgery. " Pt is hopeful that she can go to rehab at d/c and appreciates CSW assistance with d/c planning.  Emotional Assessment Appearance:  Appears stated age Attitude/Demeanor/Rapport:   (cooperative) Affect (typically observed):  Calm, Appropriate, Pleasant Orientation:  Oriented to Self, Oriented to Place, Oriented to  Time, Oriented to Situation Alcohol / Substance use:  Not Applicable Psych involvement (Current and /or in the community):  No (Comment)  Discharge Needs  Concerns to be addressed:  Discharge Planning Concerns Readmission within the last 30 days:  No Current discharge risk:  None Barriers to Discharge:  No Barriers Identified   Luretha Rued, [place] 03/13/2016, 2:05 PM

## 2016-03-14 LAB — CBC
HCT: 29.2 % — ABNORMAL LOW (ref 36.0–46.0)
HEMOGLOBIN: 9.7 g/dL — AB (ref 12.0–15.0)
MCH: 31.7 pg (ref 26.0–34.0)
MCHC: 33.2 g/dL (ref 30.0–36.0)
MCV: 95.4 fL (ref 78.0–100.0)
PLATELETS: 190 10*3/uL (ref 150–400)
RBC: 3.06 MIL/uL — ABNORMAL LOW (ref 3.87–5.11)
RDW: 13.4 % (ref 11.5–15.5)
WBC: 8.2 10*3/uL (ref 4.0–10.5)

## 2016-03-14 LAB — BASIC METABOLIC PANEL
Anion gap: 5 (ref 5–15)
BUN: 19 mg/dL (ref 6–20)
CALCIUM: 8.9 mg/dL (ref 8.9–10.3)
CHLORIDE: 110 mmol/L (ref 101–111)
CO2: 25 mmol/L (ref 22–32)
CREATININE: 0.99 mg/dL (ref 0.44–1.00)
Glucose, Bld: 103 mg/dL — ABNORMAL HIGH (ref 65–99)
Potassium: 3.7 mmol/L (ref 3.5–5.1)
SODIUM: 140 mmol/L (ref 135–145)

## 2016-03-14 LAB — GLUCOSE, CAPILLARY: GLUCOSE-CAPILLARY: 99 mg/dL (ref 65–99)

## 2016-03-14 NOTE — Progress Notes (Signed)
Physical Therapy Treatment Patient Details Name: Brenda Odonnell MRN: [id number] DOB: [birth year] Today's Date: 03/14/2016    History of Present Illness Pt s/p R THR and with hx of L TKR (7/16), spinal stenosis, and bipolar disorder    PT Comments    Ltd this pm by pt c/o increased pain and elevated anxiety level.  Pt found standing in room in front of chair and twisting to reach into suitcase on bed without use of RW and unassisted by dtr sitting on window seat on phone. Pt states she "twisted" and felt a small pop with increased pain.  Dr Ninfa Linden on floor and assessed pt prior to return to bed   Follow Up Recommendations  SNF     Equipment Recommendations  Rolling walker with 5" wheels    Recommendations for Other Services OT consult     Precautions / Restrictions Precautions Precautions: Fall Restrictions Weight Bearing Restrictions: No RLE Weight Bearing: Weight bearing as tolerated    Mobility  Bed Mobility Overal bed mobility: Needs Assistance;+ 2 for safety/equipment Bed Mobility: Sit to Supine       Sit to supine: Min assist;+2 for physical assistance;+2 for safety/equipment   General bed mobility comments: cues for sequence and use of L LE to self assist  Transfers Overall transfer level: Needs assistance Equipment used: Rolling walker (2 wheeled) Transfers: Sit to/from Stand Sit to Stand: Min assist;+2 physical assistance;+2 safety/equipment         General transfer comment: Increased time 2* pt anxiety, and cues for LE management and use of UEs to self assist  Ambulation/Gait Ambulation/Gait assistance: Min assist;+2 safety/equipment Ambulation Distance (Feet): 5 Feet Assistive device: Rolling walker (2 wheeled) Gait Pattern/deviations: Step-to pattern;Decreased step length - right;Decreased step length - left;Shuffle;Trunk flexed Gait velocity: decr Gait velocity interpretation: Below normal speed for age/gender General Gait Details: cues for  posture, position from RW and sequence   Stairs            Wheelchair Mobility    Modified Rankin (Stroke Patients Only)       Balance                                    Cognition Arousal/Alertness: Awake/alert Behavior During Therapy: WFL for tasks assessed/performed Overall Cognitive Status: Within Functional Limits for tasks assessed                      Exercises      General Comments        Pertinent Vitals/Pain Pain Assessment: 0-10 Pain Score: 10-Worst pain ever Pain Location: R hip Pain Descriptors / Indicators: Aching;Throbbing;Tightness;Sore;Spasm Pain Intervention(s): Limited activity within patient's tolerance;Monitored during session;Premedicated before session;Ice applied;Other (comment) (Dr Ninfa Linden in to assess and cleared)    Home Living Family/patient expects to be discharged to:: Skilled nursing facility Living Arrangements: Alone                  Prior Function Level of Independence: Independent with assistive device(s)      Comments: used cane. did struggle to do own ADL.    PT Goals (current goals can now be found in the care plan section) Acute Rehab PT Goals Patient Stated Goal: take care of grandbaby PT Goal Formulation: With patient Time For Goal Achievement: 03/19/16 Potential to Achieve Goals: Good Progress towards PT goals: Progressing toward goals    Frequency    7X/week  PT Plan Current plan remains appropriate    Co-evaluation             End of Session Equipment Utilized During Treatment: Gait belt Activity Tolerance: Patient limited by pain;Other (comment) (anxiety) Patient left: in bed;with call bell/phone within reach;with family/visitor present;with nursing/sitter in room     Time: [id number] PT Time Calculation (min) (ACUTE ONLY): 25 min  Charges:  $Gait Training: 8-22 mins $Therapeutic Activity: 8-22 mins                    G Codes:       Brenda Odonnell Apr 05, 2016, 3:54 PM

## 2016-03-14 NOTE — Evaluation (Signed)
Physical Therapy Evaluation Patient Details Name: Brenda Odonnell MRN: [id number] DOB: 1965/05/25 Today's Date: 03/14/2016   History of Present Illness  Pt s/p R THR and with hx of L TKR (7/16), spinal stenosis, and bipolar disorder  Clinical Impression  Pt s/p R THR presents with decreased R LE strength/ROM, post op pain and obesity limiting functional mobility.  Pt would benefit from follow up rehab at SNF level to maximize IND and safety prior to return home with ltd assist.    Follow Up Recommendations SNF    Equipment Recommendations  Rolling walker with 5" wheels (wide)    Recommendations for Other Services OT consult     Precautions / Restrictions Precautions Precautions: Fall Restrictions Weight Bearing Restrictions: No RLE Weight Bearing: Weight bearing as tolerated      Mobility  Bed Mobility Overal bed mobility: Needs Assistance;+ 2 for safety/equipment Bed Mobility: Supine to Sit     Supine to sit: HOB elevated;Min assist;+2 for safety/equipment     General bed mobility comments: cues for sequence and use of L LE to self assist  Transfers Overall transfer level: Needs assistance Equipment used: Rolling walker (2 wheeled) Transfers: Sit to/from Stand Sit to Stand: Min assist;From elevated surface;+2 safety/equipment         General transfer comment: cues for LE management and use of UEs to self assist  Ambulation/Gait Ambulation/Gait assistance: Min assist;+2 safety/equipment Ambulation Distance (Feet): 50 Feet Assistive device: Rolling walker (2 wheeled) Gait Pattern/deviations: Step-to pattern;Decreased step length - right;Decreased step length - left;Shuffle;Trunk flexed Gait velocity: decr Gait velocity interpretation: Below normal speed for age/gender General Gait Details: cues for posture, position from RW and initial sequence  Stairs            Wheelchair Mobility    Modified Rankin (Stroke Patients Only)       Balance                                              Pertinent Vitals/Pain Pain Assessment: 0-10 Pain Score: 8  Pain Location: R hip Pain Descriptors / Indicators: Aching;Burning Pain Intervention(s): Limited activity within patient's tolerance;Monitored during session;Premedicated before session;Ice applied    Home Living Family/patient expects to be discharged to:: Skilled nursing facility Living Arrangements: Alone                    Prior Function Level of Independence: Independent               Hand Dominance        Extremity/Trunk Assessment   Upper Extremity Assessment: Defer to OT evaluation           Lower Extremity Assessment: RLE deficits/detail         Communication   Communication: No difficulties  Cognition Arousal/Alertness: Awake/alert Behavior During Therapy: WFL for tasks assessed/performed Overall Cognitive Status: Within Functional Limits for tasks assessed                      General Comments      Exercises     Assessment/Plan    PT Assessment Patient needs continued PT services  PT Problem List Decreased strength;Decreased range of motion;Decreased activity tolerance;Decreased mobility;Decreased knowledge of use of DME;Obesity;Pain          PT Treatment Interventions DME instruction;Gait training;Stair training;Functional mobility training;Therapeutic activities;Therapeutic exercise;Patient/family education  PT Goals (Current goals can be found in the Care Plan section)  Acute Rehab PT Goals Patient Stated Goal: Regain IND to take care of my grandbaby PT Goal Formulation: With patient Time For Goal Achievement: 03/19/16 Potential to Achieve Goals: Good    Frequency 7X/week   Barriers to discharge        Co-evaluation PT/OT/SLP Co-Evaluation/Treatment: Yes Reason for Co-Treatment: For patient/therapist safety PT goals addressed during session: Mobility/safety with mobility OT goals addressed  during session: ADL's and self-care       End of Session Equipment Utilized During Treatment: Gait belt Activity Tolerance: Patient tolerated treatment well Patient left: in chair;with call bell/phone within reach;with family/visitor present Nurse Communication: Mobility status         Time: [id number] PT Time Calculation (min) (ACUTE ONLY): 32 min   Charges:   PT Evaluation $PT Eval Low Complexity: 1 Procedure     PT G Codes:        Brenda Odonnell 2016/03/16, 1:31 PM

## 2016-03-14 NOTE — Significant Event (Signed)
Rapid Response Event Note  Overview: Time Called: [id number] Arrival Time: 1655 Event Type: Neurologic  Initial Focused Assessment: Pt lying in bed, very drowsy, will open eyes to name calling but goes back to sleep. When asked if she was hurting she answered with her name and birthdate. Pupils are about 55mm.  Daughter at bedside and said that pt has not been able to get any rest because of people coming in her room and waking her up.  Bedside RN states that pt had gotten up earlier and was getting something out of her suitcase and made a turn causing her pain in her operative area.  When asked about giving pt narcan, she said I don't need narcan.  Daughter also stated my mother does not need narcan she just needs rest.  Pt had received pain med around 1:45pm.   Interventions: CBG 99, placed on monitor with SR. 02 sats 97 on RA.  Plan of Care (if not transferred):Pt placed on continuous 02 sat monitoring.  Instructed to monitor BP closely and call if needed.  Event Summary: [id number]  Pt more awake, but wants to be able to get some rest. Remains in SR, 02 sats 97%. Pupils remain about 19mm.   at      at          [place], Jae Dire

## 2016-03-14 NOTE — Progress Notes (Signed)
   Subjective: 1 Day Post-Op Procedure(s) (LRB): RIGHT TOTAL HIP ARTHROPLASTY ANTERIOR APPROACH (Right) Patient reports pain as mild and moderate.    Objective: Vital signs in last 24 hours: Temp:  [97.7 F (36.5 C)-98.7 F (37.1 C)] 98.7 F (37.1 C) (11/11 1026) Pulse Rate:  [54-63] 63 (11/11 1026) Resp:  [13-16] 15 (11/11 1026) BP: (84-146)/(48-86) 84/48 (11/11 1026) SpO2:  [97 %-100 %] 98 % (11/11 1026)  Intake/Output from previous day: 11/10 0701 - 11/11 0700 In: 4220 [P.O.:600; I.V.:3470; IV Piggyback:150] Out: [id number] [Urine:1175; Blood:600] Intake/Output this shift: Total I/O In: 240 [P.O.:240] Out: 150 [Urine:150]   Recent Labs  03/14/16 0424  HGB 9.7*    Recent Labs  03/14/16 0424  WBC 8.2  RBC 3.06*  HCT 29.2*  PLT 190    Recent Labs  03/14/16 0424  NA 140  K 3.7  CL 110  CO2 25  BUN 19  CREATININE 0.99  GLUCOSE 103*  CALCIUM 8.9   No results for input(s): LABPT, INR in the last 72 hours.  Neurologically intact  Assessment/Plan: 1 Day Post-Op Procedure(s) (LRB): RIGHT TOTAL HIP ARTHROPLASTY ANTERIOR APPROACH (Right) Up with therapy ,  SNF possible Monday  Marybelle Killings 03/14/2016, 12:22 PM

## 2016-03-14 NOTE — Clinical Social Work Note (Signed)
Patient chooses bed at [facility], [facility]. Facility notified and aware [facility] Medicare authorization will need to be started on Monday, 11/13 morning.   PT evaluation completed and sent to [facility] for review and insurance purposes.   MSW remains available as needed.   Glendon Axe, MSW [phone] 03/14/2016 1:46 PM

## 2016-03-14 NOTE — Progress Notes (Signed)
Pt stood up from chair to get something out of her suitcase, which was on bed. Forgot precautions and twisted her hip, causing much pain. Dr Ninfa Linden on unit & notified. He came and saw pt. Pt medicated for pain. Calianne Larue, [employer]

## 2016-03-14 NOTE — Progress Notes (Signed)
Patient haven't voided yet since Foley Catheter was removed this afternoon at 2-3pm, Bladder Scanned showed <115ml,  ,RN advice pt to drink more fluids and will be back to check her again. Will continue to monitor.

## 2016-03-14 NOTE — Evaluation (Signed)
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 Pt lives alone and would benefit from post acute OT at SNF to progress ADL independence for return home safely. Pt is motivated. Will continue to follow to increase independence with self care tasks.     Follow Up Recommendations  Supervision/Assistance - 24 hour;SNF    Equipment Recommendations  Other (comment) (next venue)    Recommendations for Other Services       Precautions / Restrictions Precautions Precautions: Fall Restrictions Weight Bearing Restrictions: No RLE Weight Bearing: Weight bearing as tolerated      Mobility Bed Mobility Overal bed mobility: Needs Assistance;+ 2 for safety/equipment Bed Mobility: Supine to Sit     Supine to sit: HOB elevated;Min assist;+2 for safety/equipment     General bed mobility comments: cues for sequence and use of L LE to self assist  Transfers Overall transfer level: Needs assistance Equipment used: Rolling walker (2 wheeled) Transfers: Sit to/from Stand Sit to Stand: Min assist;From elevated surface;+2 safety/equipment         General transfer comment: cues for LE management and use of UEs to self assist    Balance                                            ADL Overall ADL's : Needs assistance/impaired Eating/Feeding: Independent;Sitting   Grooming: Oral care;Supervision/safety;Sitting   Upper Body Bathing: Set up;Sitting;Supervision/ safety   Lower Body Bathing: +2 for safety/equipment;Moderate assistance;Sit to/from stand   Upper Body Dressing : Set up;Supervision/safety;Sitting   Lower Body Dressing: +2 for safety/equipment;Moderate assistance;Sit  to/from stand   Toilet Transfer: +2 for safety/equipment;Minimal assistance;Ambulation;RW   Toileting- Clothing Manipulation and Hygiene: +2 for safety/equipment;Moderate assistance;Sit to/from stand         General ADL Comments: Educated pt on AE options for LB self care. Sat EOB for approximately 7 or 8 minutes to perform oral care. Planning SNF.     Vision     Perception     Praxis      Pertinent Vitals/Pain Pain Assessment: 0-10 Pain Score: 8  Pain Location: R hip Pain Descriptors / Indicators: Aching;Burning Pain Intervention(s): Limited activity within patient's tolerance;Monitored during session;Premedicated before session;Ice applied     Hand Dominance Right   Extremity/Trunk Assessment Upper Extremity Assessment Upper Extremity Assessment: LUE deficits/detail LUE Deficits / Details: shoulder flexion to approximately 120 comfortably. weak hand grip.  elbow 3+/5          Communication Communication Communication: No difficulties   Cognition Arousal/Alertness: Awake/alert Behavior During Therapy: WFL for tasks assessed/performed Overall Cognitive Status: Within Functional Limits for tasks assessed                     General Comments       Exercises       Shoulder Instructions      Home Living Family/patient expects to be discharged to:: Skilled nursing facility Living Arrangements: Alone  Prior Functioning/Environment Level of Independence: Independent with assistive device(s)        Comments: used cane. did struggle to do own ADL.         OT Problem List: Decreased strength;Decreased knowledge of use of DME or AE   OT Treatment/Interventions: Self-care/ADL training;Patient/family education;Therapeutic activities    OT Goals(Current goals can be found in the care plan section) Acute Rehab OT Goals Patient Stated Goal: take care of grandbaby OT Goal Formulation: With  patient Time For Goal Achievement: 03/21/16 Potential to Achieve Goals: Good  OT Frequency: Min 2X/week   Barriers to D/C:            Co-evaluation   Reason for Co-Treatment: For patient/therapist safety PT goals addressed during session: Mobility/safety with mobility OT goals addressed during session: ADL's and self-care      End of Session Equipment Utilized During Treatment: Rolling walker  Activity Tolerance: Patient tolerated treatment well Patient left: in chair;with call bell/phone within reach   Time: 1030-1055 OT Time Calculation (min): 25 min Charges:  OT General Charges $OT Visit: 1 Procedure OT Evaluation $OT Eval Low Complexity: 1 Procedure G-Codes:    [name] 03/14/2016, 1:59 PM

## 2016-03-14 NOTE — Care Management Note (Signed)
Case Management Note  Patient Details  Name: Maimuna Schlegelmilch MRN: [id number] Date of Birth: 1965-07-02  Subjective/Objective:  S/p R THR                   Action/Plan: Discharge Planning: Chart reviewed. CSW following for SNF placement.    Expected Discharge Date:               Expected Discharge Plan:  Skilled Nursing Facility  In-House Referral:  Clinical Social Work  Discharge planning Services  CM Consult  Post Acute Care Choice:  NA Choice offered to:  NA  DME Arranged:  N/A DME Agency:  NA  HH Arranged:  NA HH Agency:  NA  Status of Service:  Completed, signed off  If discussed at [place] of Stay Meetings, dates discussed:    Additional Comments:  Erenest Rasher, RN 03/14/2016, 1:50 PM

## 2016-03-15 LAB — CBC
HEMATOCRIT: 28.5 % — AB (ref 36.0–46.0)
Hemoglobin: 9.2 g/dL — ABNORMAL LOW (ref 12.0–15.0)
MCH: 30.4 pg (ref 26.0–34.0)
MCHC: 32.3 g/dL (ref 30.0–36.0)
MCV: 94.1 fL (ref 78.0–100.0)
Platelets: 157 10*3/uL (ref 150–400)
RBC: 3.03 MIL/uL — ABNORMAL LOW (ref 3.87–5.11)
RDW: 13.4 % (ref 11.5–15.5)
WBC: 8.2 10*3/uL (ref 4.0–10.5)

## 2016-03-15 NOTE — Progress Notes (Signed)
   Subjective: 2 Days Post-Op Procedure(s) (LRB): RIGHT TOTAL HIP ARTHROPLASTY ANTERIOR APPROACH (Right) Patient reports pain as moderate.  Falling asleep when not talking to her. States she is in pain. Patient woke up and started eating breakfast  Objective: Vital signs in last 24 hours: Temp:  [97.7 F (36.5 C)-98.9 F (37.2 C)] 98.9 F (37.2 C) (11/12 0455) Pulse Rate:  [63-81] 81 (11/12 0455) Resp:  [15-16] 16 (11/11 1545) BP: (69-117)/(48-64) 117/64 (11/12 0455) SpO2:  [97 %-100 %] 100 % (11/12 0455)  Intake/Output from previous day: 11/11 0701 - 11/12 0700 In: 3002.5 [P.O.:1200; I.V.:1787.5] Out: 150 [Urine:150] Intake/Output this shift: No intake/output data recorded.   Recent Labs  03/14/16 0424 03/15/16 0408  HGB 9.7* 9.2*    Recent Labs  03/14/16 0424 03/15/16 0408  WBC 8.2 8.2  RBC 3.06* 3.03*  HCT 29.2* 28.5*  PLT 190 157    Recent Labs  03/14/16 0424  NA 140  K 3.7  CL 110  CO2 25  BUN 19  CREATININE 0.99  GLUCOSE 103*  CALCIUM 8.9   No results for input(s): LABPT, INR in the last 72 hours.  Neurologically intact Neurovascular intact Incision: scant drainage  Assessment/Plan: 2 Days Post-Op Procedure(s) (LRB): RIGHT TOTAL HIP ARTHROPLASTY ANTERIOR APPROACH (Right) Up with therapy  , SNF  Marybelle Killings 03/15/2016, 9:57 AM

## 2016-03-15 NOTE — Progress Notes (Signed)
Transferred to 1537, unit requirement. No changes in assessment. Carder Yin, [employer]

## 2016-03-15 NOTE — Clinical Social Work Note (Signed)
CSW spoke with Abigail Butts at [facility]. She will ask Narda Rutherford to initiate [employer] auth in the morning asap.  Will ask weekday CSW to follow up re: possible d/c.  Lorie Phenix. Pauline Good, [place]

## 2016-03-15 NOTE — Progress Notes (Signed)
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  Follow Up Recommendations  SNF     Equipment Recommendations  Rolling walker with 5" wheels    Recommendations for Other Services OT consult     Precautions / Restrictions Precautions Precautions: Fall Restrictions Weight Bearing Restrictions: No RLE Weight Bearing: Weight bearing as tolerated    Mobility  Bed Mobility               General bed mobility comments: Pt OOB and requests back to chair for lunch  Transfers Overall transfer level: Needs assistance Equipment used: Rolling walker (2 wheeled) Transfers: Sit to/from Stand Sit to Stand: Min assist;+2 safety/equipment Stand pivot transfers: Min assist;+2 safety/equipment       General transfer comment: Cues for LE management and use of UEs to self assist.  Stand pvt recliner to [facility] with RW  Ambulation/Gait Ambulation/Gait assistance: Min assist;+2 safety/equipment Ambulation Distance (Feet): 18 Feet Assistive device: Rolling walker (2 wheeled) Gait Pattern/deviations: Step-to pattern;Decreased step length - right;Decreased step length - left;Shuffle;Trunk flexed Gait velocity: decr Gait velocity interpretation: Below normal speed for age/gender General Gait Details: Increased time and constant cues for posture, position from RW and sequence   Stairs            Wheelchair Mobility    Modified Rankin (Stroke Patients Only)       Balance                                    Cognition Arousal/Alertness: Awake/alert Behavior During Therapy: WFL for tasks assessed/performed Overall Cognitive Status: Within Functional Limits for tasks assessed                       Exercises      General Comments        Pertinent Vitals/Pain Pain Assessment: 0-10 Pain Score: 9  Pain Location: R hip Pain Descriptors / Indicators: Aching;Sore Pain Intervention(s): Limited activity within patient's tolerance;Monitored during session;Premedicated before session;Ice applied    Home Living                      Prior Function            PT Goals (current goals can now be found in the care plan section) Acute Rehab PT Goals Patient Stated Goal: take care of grandbaby PT Goal Formulation: With patient Time For Goal Achievement: 03/19/16 Potential to Achieve Goals: Good Progress towards PT goals: Progressing toward goals    Frequency    7X/week      PT Plan Current plan remains appropriate    Co-evaluation             End of Session Equipment Utilized During Treatment: Gait belt Activity Tolerance: Patient limited by fatigue;Patient limited by pain Patient left: in chair;with call bell/phone within reach;with family/visitor present     Time: 1335-1405 PT Time Calculation (min) (ACUTE ONLY): 30 min  Charges:  $Gait Training: 8-22 mins $Therapeutic Activity: 8-22 mins  G Codes:      Valeria Krisko Apr 04, 2016, 2:48 PM

## 2016-03-16 ENCOUNTER — Encounter (HOSPITAL_COMMUNITY): Payer: Self-pay | Admitting: Orthopaedic Surgery

## 2016-03-16 LAB — CBC
HCT: 26.7 % — ABNORMAL LOW (ref 36.0–46.0)
Hemoglobin: 8.7 g/dL — ABNORMAL LOW (ref 12.0–15.0)
MCH: 30.5 pg (ref 26.0–34.0)
MCHC: 32.6 g/dL (ref 30.0–36.0)
MCV: 93.7 fL (ref 78.0–100.0)
PLATELETS: 173 10*3/uL (ref 150–400)
RBC: 2.85 MIL/uL — AB (ref 3.87–5.11)
RDW: 13.6 % (ref 11.5–15.5)
WBC: 8.2 10*3/uL (ref 4.0–10.5)

## 2016-03-16 MED ORDER — OXYCODONE HCL 5 MG PO TABS: 10.0000 mg | ORAL_TABLET | ORAL | 0 refills | Status: DC | PRN

## 2016-03-16 MED ORDER — ASPIRIN 81 MG PO CHEW: 81.0000 mg | CHEWABLE_TABLET | Freq: Two times a day (BID) | ORAL | 0 refills | Status: DC

## 2016-03-16 MED ORDER — BACLOFEN 20 MG PO TABS: 20.0000 mg | ORAL_TABLET | Freq: Three times a day (TID) | ORAL | 0 refills | Status: DC

## 2016-03-16 NOTE — Progress Notes (Signed)
Am assessment unchanged except I V removed. For transport to [facility] for rehab. Report called to [facility].

## 2016-03-16 NOTE — Progress Notes (Signed)
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  Follow Up Recommendations  SNF     Equipment Recommendations  Rolling walker with 5" wheels    Recommendations for Other Services OT consult     Precautions / Restrictions Precautions Precautions: Fall Restrictions Weight Bearing Restrictions: No RLE Weight Bearing: Weight bearing as tolerated    Mobility  Bed Mobility Overal bed mobility: Needs Assistance Bed Mobility: Supine to Sit     Supine to sit: Min assist;HOB elevated     General bed mobility comments: cues for sequence and physical assist to manage R LE  Transfers Overall transfer level: Needs assistance Equipment used: Rolling walker (2 wheeled) Transfers: Sit to/from Stand Sit to Stand: Min assist         General transfer comment: Cues for LE management and use of UEs to self assist.   Ambulation/Gait Ambulation/Gait assistance: Min assist Ambulation Distance (Feet): 60 Feet Assistive device: Rolling walker (2 wheeled) Gait Pattern/deviations: Step-to pattern;Step-through pattern;Decreased step length - right;Decreased step length - left;Shuffle;Trunk flexed Gait velocity: decr Gait velocity interpretation: Below normal speed for age/gender General Gait Details: Increased time and constant cues for posture, position from RW and sequence   Stairs            Wheelchair Mobility    Modified Rankin (Stroke Patients Only)       Balance                                    Cognition Arousal/Alertness: Awake/alert Behavior During Therapy: WFL for tasks assessed/performed Overall Cognitive Status: Within Functional Limits for tasks assessed                       Exercises Total Joint Exercises Ankle Circles/Pumps: AROM;Both;15 reps;Supine Quad Sets: AROM;Both;10 reps;Supine Heel Slides: AAROM;Right;20 reps;Supine Hip ABduction/ADduction: AAROM;Right;15 reps;Supine    General Comments        Pertinent Vitals/Pain Pain Assessment: Faces Faces Pain Scale: Hurts little more Pain Location: R hip Pain Descriptors / Indicators: Aching;Sore Pain Intervention(s): Limited activity within patient's tolerance;Monitored during session;Premedicated before session;Ice applied    Home Living                      Prior Function            PT Goals (current goals can now be found in the care plan section) Acute Rehab PT Goals Patient Stated Goal: take care of grandbaby PT Goal Formulation: With patient Time For Goal Achievement: 03/19/16 Potential to Achieve Goals: Good Progress towards PT goals: Progressing toward goals    Frequency    7X/week      PT Plan Current plan remains appropriate    Co-evaluation             End of Session Equipment Utilized During Treatment: Gait belt Activity Tolerance: Patient tolerated treatment well Patient left: in chair;with call bell/phone within reach     Time: 0919-0946 PT Time Calculation (min) (ACUTE ONLY): 27 min  Charges:  $Gait Training: 8-22 mins $Therapeutic Exercise: 8-22 mins  G Codes:      Geral Tuch Apr 02, 2016, 11:45 AM

## 2016-03-16 NOTE — Progress Notes (Signed)
[name], female   DOB: Apr 11, 1966, 50 y.o.   MRN: [id number] Doing well.  Can be discharged to skilled nursing today.

## 2016-03-16 NOTE — Clinical Social Work Placement (Signed)
   CLINICAL SOCIAL WORK PLACEMENT  NOTE  Date:  03/16/2016  Patient Details  Name: Brenda Odonnell MRN: [id number] Date of Birth: November 30, 1965  Clinical Social Work is seeking post-discharge placement for this patient at the [place] level of care (*CSW will initial, date and re-position this form in  chart as items are completed):      Patient/family provided with [place] Work Department's list of facilities offering this level of care within the geographic area requested by the patient (or if unable, by the patient's family).  Yes   Patient/family informed of their freedom to choose among providers that offer the needed level of care, that participate in Medicare, Medicaid or managed care program needed by the patient, have an available bed and are willing to accept the patient.  Yes   Patient/family informed of [facility]'s ownership interest in [facility] and [facility], as well as of the fact that they are under no obligation to receive care at these facilities.  PASRR submitted to EDS on       PASRR number received on       Existing PASRR number confirmed on 03/13/16     FL2 transmitted to all facilities in geographic area requested by pt/family on 03/13/16     FL2 transmitted to all facilities within larger geographic area on       Patient informed that his/her managed care company has contracts with or will negotiate with certain facilities, including the following:        Yes   Patient/family informed of bed offers received.  Patient chooses bed at [facility]     Physician recommends and patient chooses bed at      Patient to be transferred to [facility] on 03/16/16.  Patient to be transferred to facility by [facility]     Patient family notified on 03/16/16 of transfer.  Name of family member notified:  DAUGHTER     PHYSICIAN       Additional Comment: Pt / daughter are in agreement with  d/c to [facility] today. SNF has requested [facility] authorization. A decision is pending. SNF has accepted 5 day LOG from [facility] while waiting on insurance authorization. [facility] transport is needed. Medical necessity form completed. Pt is aware out of pocket costs may be associated with [facility] transport. D/C Summary has been sent to SNF for review. Scripts included in d/c packet. # for reort provided to nsg.   _______________________________________________ Luretha Rued, [place]   [phone] 03/16/2016, 12:23 PM

## 2016-03-16 NOTE — Discharge Instructions (Signed)

## 2016-03-16 NOTE — Discharge Summary (Signed)
[name] MRN: [id number] DOB/AGE: 11-03-1965 50 y.o.  Admit date: 03/13/2016 Discharge date: 03/16/2016  Admission Diagnoses:  Principal Problem:   Unilateral primary osteoarthritis, right hip Active Problems:   Status post total replacement of right hip   Discharge Diagnoses:  Same  Past Medical History:  Diagnosis Date  . Arthritis   . Arthritis   . Bipolar 2 disorder ([place])   . Carpal tunnel syndrome   . Depression   . GERD (gastroesophageal reflux disease)   . Glaucoma   . Glaucoma   . Hypertension   . Leiomyoma of uterus, unspecified   . Migraine   . Obesity   . Sinus infection   . Sleep apnea   . Spinal stenosis   . Spinal stenosis in cervical region 09/07/2014   C5-6 level    Surgeries: Procedure(s): RIGHT TOTAL HIP ARTHROPLASTY ANTERIOR APPROACH on 03/13/2016   Consultants:   Discharged Condition: Improved  Hospital Course: Brenda Odonnell is an 50 y.o. female who was admitted 03/13/2016 for operative treatment ofUnilateral primary osteoarthritis, right hip. Patient has severe unremitting pain that affects sleep, daily activities, and work/hobbies. After pre-op clearance the patient was taken to the operating room on 03/13/2016 and underwent  Procedure(s): RIGHT TOTAL HIP ARTHROPLASTY ANTERIOR APPROACH.    Patient was given perioperative antibiotics: Anti-infectives    Start     Dose/Rate Route Frequency Ordered Stop   03/13/16 1600  ceFAZolin (ANCEF) IVPB 2g/100 mL premix     2 g 200 mL/hr over 30 Minutes Intravenous Every 6 hours 03/13/16 1305 03/13/16 2153   03/13/16 0600  ceFAZolin (ANCEF) 3 g in dextrose 5 % 50 mL IVPB     3 g 130 mL/hr over 30 Minutes Intravenous 30 min pre-op 03/12/16 1324 03/13/16 0940       Patient was given sequential compression devices, early ambulation, and chemoprophylaxis to prevent DVT.  Patient benefited maximally from hospital stay and there were no complications.    Recent vital signs: Patient  Vitals for the past 24 hrs:  BP Temp Temp src Pulse Resp SpO2  03/16/16 0404 (!) 117/41 98.7 F (37.1 C) Oral 84 18 99 %  03/15/16 2051 (!) 96/50 98.5 F (36.9 C) Oral 82 16 98 %  03/15/16 2049 (!) 92/48 - - - - -  03/15/16 1444 (!) 94/47 99.6 F (37.6 C) Oral 78 16 100 %     Recent laboratory studies:  Recent Labs  03/14/16 0424 03/15/16 0408 03/16/16 0408  WBC 8.2 8.2 8.2  HGB 9.7* 9.2* 8.7*  HCT 29.2* 28.5* 26.7*  PLT 190 157 173  NA 140  --   --   K 3.7  --   --   CL 110  --   --   CO2 25  --   --   BUN 19  --   --   CREATININE 0.99  --   --   GLUCOSE 103*  --   --   CALCIUM 8.9  --   --      Discharge Medications:     Medication List    STOP taking these medications   acetaminophen-codeine 300-60 MG tablet Commonly known as:  TYLENOL #4   oxyCODONE-acetaminophen 10-325 MG tablet Commonly known as:  PERCOCET     TAKE these medications   ALPRAZolam 0.25 MG tablet Commonly known as:  XANAX Take 0.25 mg by mouth 3 (three) times daily as needed for anxiety.   amphetamine-dextroamphetamine 30 MG tablet Commonly known  as:  ADDERALL Take 30 mg by mouth every morning.   amphetamine-dextroamphetamine 10 MG tablet Commonly known as:  ADDERALL Take 10 mg by mouth daily at 12 noon.   antiseptic oral rinse Liqd 15 mLs by Mouth Rinse route as needed for dry mouth.   aspirin 81 MG chewable tablet Chew 1 tablet (81 mg total) by mouth 2 (two) times daily.   atenolol 100 MG tablet Commonly known as:  TENORMIN Take 100 mg by mouth daily.   baclofen 20 MG tablet Commonly known as:  LIORESAL Take 1 tablet (20 mg total) by mouth 3 (three) times daily.   COMBIGAN 0.2-0.5 % ophthalmic solution Generic drug:  brimonidine-timolol Place 1 drop into both eyes 2 (two) times daily.   CONSTULOSE 10 GM/15ML solution Generic drug:  lactulose Take 30 g by mouth daily as needed for moderate constipation.   dorzolamide 2 % ophthalmic solution Commonly known as:   TRUSOPT Place 1 drop into both eyes 2 (two) times daily.   DULoxetine 60 MG capsule Commonly known as:  CYMBALTA TAKE 1 TABLET DAILY   furosemide 40 MG tablet Commonly known as:  LASIX Take 40 mg by mouth daily as needed for fluid.   gabapentin 300 MG capsule Commonly known as:  NEURONTIN Take 1 capsule (300 mg total) by mouth 3 (three) times daily.   hydrocortisone 1 % Crea Commonly known as:  PROCTO-PAK Apply 1 Applicatorful topically 2 (two) times daily. What changed:  when to take this  reasons to take this   lamoTRIgine 200 MG tablet Commonly known as:  LAMICTAL Take 200 mg by mouth daily.   lisinopril 40 MG tablet Commonly known as:  PRINIVIL,ZESTRIL Take 1 tablet (40 mg total) by mouth daily.   LYRICA 150 MG capsule Generic drug:  pregabalin Take 150 mg by mouth 2 (two) times daily.   omeprazole 40 MG capsule Commonly known as:  PRILOSEC Take 1 capsule (40 mg total) by mouth daily.   ondansetron 4 MG tablet Commonly known as:  ZOFRAN Take 1 tablet (4 mg total) by mouth every 8 (eight) hours as needed for nausea or vomiting.   oxyCODONE 5 MG immediate release tablet Commonly known as:  Oxy IR/ROXICODONE Take 2 tablets (10 mg total) by mouth every 4 (four) hours as needed for severe pain or breakthrough pain.   QUEtiapine 25 MG tablet Commonly known as:  SEROQUEL Take 1 tablet (25 mg total) by mouth at bedtime.   RESTASIS MULTIDOSE 0.05 % ophthalmic emulsion Generic drug:  cycloSPORINE INSTILL 1 DROP IN BOTH EYES EVERY 12 HOURS DAILY.   topiramate 200 MG tablet Commonly known as:  TOPAMAX Take 200 mg by mouth daily.   zolpidem 10 MG tablet Commonly known as:  AMBIEN Take 10 mg by mouth at bedtime as needed.            Durable Medical Equipment        Start     Ordered   03/13/16 1306  DME Walker rolling  Once     03/13/16 1305   03/13/16 1306  DME 3 n 1  Once     03/13/16 1305      Diagnostic Studies: Dg Hip Port Unilat With Pelvis  1v Right  Result Date: 03/13/2016 CLINICAL DATA:  50 year old female with a history of postop right anterior approach total hip replacement. EXAM: DG HIP (WITH OR WITHOUT PELVIS) 1V PORT RIGHT COMPARISON:  03/13/2016, MR 01/04/2016 FINDINGS: Early postoperative changes of right hip arthroplasty with  gas in the surgical blood and surgical staples projecting in the soft tissues. Femoral and acetabular components are congruent. No complicating features. Degenerative changes of the left hip. IMPRESSION: Early postoperative changes of right hip arthroplasty without complicating features. Signed, Dulcy Fanny. Earleen Newport, DO [facility] Electronically Signed   By: Corrie Mckusick D.O.   On: 03/13/2016 12:02   Dg Hip Operative Unilat With Pelvis Right  Result Date: 03/13/2016 CLINICAL DATA:  Right total hip arthroplasty EXAM: OPERATIVE right HIP (WITH PELVIS IF PERFORMED) 1 VIEWS TECHNIQUE: Fluoroscopic spot image(s) were submitted for interpretation post-operatively. COMPARISON:  None. FINDINGS: Fluoroscopy for total right hip arthroplasty. Prosthesis is located and there is no evidence of periprosthetic fracture. IMPRESSION: Fluoroscopy for total right hip arthroplasty. No unexpected finding. Electronically Signed   By: Monte Fantasia M.D.   On: 03/13/2016 11:17    Disposition: to SNF  Discharge Instructions    Call MD / Call 911    Complete by:  As directed    If you experience chest pain or shortness of breath, CALL 911 and be transported to the hospital emergency room.  If you develope a fever above 101 F, pus (white drainage) or increased drainage or redness at the wound, or calf pain, call your surgeon's office.   Constipation Prevention    Complete by:  As directed    Drink plenty of fluids.  Prune juice may be helpful.  You may use a stool softener, such as Colace (over the counter) 100 mg twice a day.  Use MiraLax (over the counter) for  constipation as needed.   Diet - low sodium heart healthy    Complete by:  As directed    Discharge patient    Complete by:  As directed    Increase activity slowly as tolerated    Complete by:  As directed        Contact information for follow-up providers    Mcarthur Rossetti, MD. Schedule an appointment as soon as possible for a visit in 2 week(s).   Specialty:  Orthopedic Surgery Contact information: [place] [address] [phone]            Contact information for after-discharge care    Destination    [facility] Follow up.   Specialty:  [place] information: [facility] [phone]                   Signed: Mcarthur Rossetti 03/16/2016, 7:33 AM

## 2016-03-24 ENCOUNTER — Ambulatory Visit (INDEPENDENT_AMBULATORY_CARE_PROVIDER_SITE_OTHER): Payer: Medicare HMO | Admitting: Orthopaedic Surgery

## 2016-03-24 ENCOUNTER — Ambulatory Visit (INDEPENDENT_AMBULATORY_CARE_PROVIDER_SITE_OTHER): Payer: Self-pay

## 2016-03-24 ENCOUNTER — Encounter (INDEPENDENT_AMBULATORY_CARE_PROVIDER_SITE_OTHER): Payer: Self-pay | Admitting: Orthopaedic Surgery

## 2016-03-24 ENCOUNTER — Ambulatory Visit (HOSPITAL_COMMUNITY)
Admission: RE | Admit: 2016-03-24 | Discharge: 2016-03-24 | Disposition: A | Discharge status: Home / Self Care | Payer: Medicare HMO | Source: Ambulatory Visit | Attending: Orthopaedic Surgery | Admitting: Orthopaedic Surgery

## 2016-03-24 DIAGNOSIS — Z96641 Presence of right artificial hip joint: Secondary | ICD-10-CM | POA: Diagnosis not present

## 2016-03-24 DIAGNOSIS — M79604 Pain in right leg: Secondary | ICD-10-CM | POA: Diagnosis not present

## 2016-03-24 DIAGNOSIS — M7989 Other specified soft tissue disorders: Secondary | ICD-10-CM | POA: Insufficient documentation

## 2016-03-24 NOTE — Progress Notes (Signed)
VASCULAR LAB PRELIMINARY  PRELIMINARY  PRELIMINARY  PRELIMINARY  Right lower extremity venous duplex completed.    Preliminary report:  Right:  No evidence of DVT, superficial thrombosis, or Baker's cyst.  Waddell Iten, RVS 03/24/2016, 3:12 PM

## 2016-03-24 NOTE — Progress Notes (Signed)
Brenda Odonnell is now 10 days to 11 days post a right total hip arthroplasty. She comes in early today complaining of increased leg pain below her knee and calf pain. She said her hip is doing well but she has slow time getting around however she is somewhat is significantly morbidly obese and is working hard with physical therapy at a skilled nursing facility.  On examination of her right hip incision looks great. Staples are in place with no significant drainage at all. It is too early to remove the staples. I can easily put her hip the range of motion without any difficulty at all on the right side. Her right calf is soft but it is painful to her.  Having her calf pain in right foot swelling we will send her for an ultrasound Doppler of the right lower extremity today to rule out a DVT. If she does have a DVT we'll start her on Xarelto. If not she'll continue her twice-daily aspirin and continue physical therapy at the skilled nursing facility. She has a follow-up appointment next week to reassess her incision is see we can remove her staples at that visit. No x-rays are needed.

## 2016-03-30 ENCOUNTER — Ambulatory Visit (INDEPENDENT_AMBULATORY_CARE_PROVIDER_SITE_OTHER): Payer: Medicare HMO | Admitting: Physician Assistant

## 2016-03-30 DIAGNOSIS — Z96641 Presence of right artificial hip joint: Secondary | ICD-10-CM

## 2016-03-30 NOTE — Progress Notes (Addendum)
Office Visit Note   Patient: Brenda Odonnell           Date of Birth: June 08, 1965           MRN: [id number] Visit Date: 03/30/2016              Requested by: Antonietta Jewel, MD [address], [place], [place] [address] PCP: Antonietta Jewel, MD   Assessment & Plan: Visit Diagnoses: No diagnosis found.  Plan: Staples removed Steri-Strips applied. Continue to work with therapy on range of motion and gait and balance. Continue her aspirin for another week and then discontinue. Prescription for compression stockings given  Follow-Up Instructions: Return in about 4 weeks (around 04/27/2016) for wound check.   Orders:  No orders of the defined types were placed in this encounter.  No orders of the defined types were placed in this encounter.     Procedures: No procedures performed   Clinical Data: No additional findings.   Subjective: Chief Complaint  Patient presents with  . Right Hip - Routine Post Op    Patient here today for her 1st post operative appointment. She ambulates with her rolling walker. She is doing ok but states her incision is really inflamed. Staples intact. Concerns about pain in the right leg along with some swelling. She did undergo a Doppler due to the swelling which was negative for DVT.    Review of Systems   Objective: Vital Signs: There were no vitals taken for this visit.  Physical Exam  Constitutional: She is oriented to person, place, and time. She appears well-developed and well-nourished. No distress.  Neurological: She is alert and oriented to person, place, and time.    Ortho Exam Right lower extremity 5 out of 5 strengths throughout against resistance. He has decreased range of motion of the right hip. Right hip incision approximated with staples no signs of infection slight erythema due to staple irritation.Edema right lower leg. Specialty Comments:  No specialty comments available.  Imaging: No results found.   PMFS  History: Patient Active Problem List   Diagnosis Date Noted  . Unilateral primary osteoarthritis, right hip 03/13/2016  . Status post total replacement of right hip 03/13/2016  . Essential hypertension 05/26/2015  . Shingles 12/11/2014  . Osteoarthritis of left knee 11/23/2014  . Status post total left knee replacement 11/23/2014  . Dysphagia, pharyngoesophageal phase 09/07/2014  . Spinal stenosis in cervical region 09/07/2014  . BV (bacterial vaginosis) 04/03/2014  . Superficial fungus infection of skin 04/02/2014  . Candida vaginitis 04/02/2014  . Pelvic pain in female 02/13/2014  . Abnormality of gait 12/11/2013  . Urinary incontinence 12/11/2013  . Memory loss 12/11/2013  . Drowsiness 12/07/2012  . Dizziness and giddiness 12/07/2012  . Major depressive disorder, recurrent episode, severe ([place]) 12/01/2012  . Overdose 12/01/2012  . Urinary urgency 10/12/2012  . Carbuncle 10/12/2012  . Headache 05/19/2012  . Spinal stenosis   . Glaucoma   . Carpal tunnel syndrome    Past Medical History:  Diagnosis Date  . Arthritis   . Arthritis   . Bipolar 2 disorder ([place])   . Carpal tunnel syndrome   . Depression   . GERD (gastroesophageal reflux disease)   . Glaucoma   . Glaucoma   . Hypertension   . Leiomyoma of uterus, unspecified   . Migraine   . Obesity   . Sinus infection   . Sleep apnea   . Spinal stenosis   . Spinal stenosis in cervical region  09/07/2014   C5-6 level    Family History  Problem Relation Age of Onset  . Hypertension Father   . Hyperlipidemia Father   . Fibromyalgia Mother   . Diabetes Mother   . Healthy Sister   . Healthy Brother   . Hypertension      Past Surgical History:  Procedure Laterality Date  . ABDOMINAL HYSTERECTOMY    . BREAST REDUCTION SURGERY    . CARPAL TUNNEL RELEASE Bilateral   . CESAREAN SECTION    . KNEE SURGERY Left    arthroscopic  . MYELOGRAM    . TOTAL HIP ARTHROPLASTY Right 03/13/2016   Procedure: RIGHT TOTAL HIP  ARTHROPLASTY ANTERIOR APPROACH;  Surgeon: Mcarthur Rossetti, MD;  Location: [facility] ORS;  Service: Orthopedics;  Laterality: Right;  . TOTAL KNEE ARTHROPLASTY Left 11/23/2014   Procedure: LEFT TOTAL KNEE ARTHROPLASTY;  Surgeon: Mcarthur Rossetti, MD;  Location: [facility] ORS;  Service: Orthopedics;  Laterality: Left;   Social History   Occupational History  .  Unemployed   Social History Main Topics  . Smoking status: Never Smoker  . Smokeless tobacco: Never Used  . Alcohol use Yes     Comment: socially, red wine  . Drug use: No  . Sexual activity: Yes    Partners: Male    Birth control/ protection: Surgical

## 2016-04-01 ENCOUNTER — Telehealth (INDEPENDENT_AMBULATORY_CARE_PROVIDER_SITE_OTHER): Payer: Self-pay | Admitting: Radiology

## 2016-04-01 NOTE — Telephone Encounter (Signed)
Verbal orders given to Jeff. 

## 2016-04-01 NOTE — Telephone Encounter (Signed)
Merry Proud would like verbal orders for home health PT 2x week for 4 weeks until patient is seen back in the office. Please return call at [phone]

## 2016-04-03 ENCOUNTER — Telehealth (INDEPENDENT_AMBULATORY_CARE_PROVIDER_SITE_OTHER): Payer: Self-pay | Admitting: Orthopaedic Surgery

## 2016-04-03 NOTE — Telephone Encounter (Signed)
Ms. Karcher called saying her Physical Therapist came to her home today. She's been having difficulty standing up and can't perform the suggested exercises from the Physical Therapist due to this. The Physical Therapist told her he thinks she has a nerve-related issue and wants Dr. Ninfa Linden to know so he can advise her on how best to address this issue.   In addition, she needs a refill of Oxycodone. She has enough to last her until Monday.  Please give her a phone call regarding these things. Pt's ph# [phone] Thank you.

## 2016-04-06 MED ORDER — OXYCODONE HCL 5 MG PO TABS: 10.0000 mg | ORAL_TABLET | Freq: Four times a day (QID) | ORAL | 0 refills | Status: DC | PRN

## 2016-04-06 NOTE — Telephone Encounter (Signed)
Please advise 

## 2016-04-06 NOTE — Telephone Encounter (Signed)
Can come and pick up script.  Only time to treat her post surgery.  No ne PT recs

## 2016-04-07 NOTE — Telephone Encounter (Signed)
Aware Rx ready at front desk

## 2016-04-16 ENCOUNTER — Telehealth (INDEPENDENT_AMBULATORY_CARE_PROVIDER_SITE_OTHER): Payer: Self-pay

## 2016-04-16 ENCOUNTER — Telehealth (INDEPENDENT_AMBULATORY_CARE_PROVIDER_SITE_OTHER): Payer: Self-pay | Admitting: *Deleted

## 2016-04-16 NOTE — Telephone Encounter (Signed)
Patient called today and states she is having sever pain in bil legs. She states she can stand but cannot Walk. She states she is having shooting pains in bil feet. She is taking Oxy 5 & Gabapentin. She states Meds seems to help some. She has a follow up appt on 04/30/16. She would like a call back to see what she can do.  [phone]

## 2016-04-16 NOTE — Telephone Encounter (Signed)
Please advise 

## 2016-04-16 NOTE — Telephone Encounter (Signed)
Really nothing else to do except to rest and take her meds.  Also can try advil or aleve on top of the other things she is taking.

## 2016-04-17 NOTE — Telephone Encounter (Signed)
appt made for patient

## 2016-04-17 NOTE — Telephone Encounter (Signed)
ERROR

## 2016-04-20 ENCOUNTER — Ambulatory Visit (INDEPENDENT_AMBULATORY_CARE_PROVIDER_SITE_OTHER): Payer: Medicare HMO | Admitting: Physician Assistant

## 2016-04-20 ENCOUNTER — Encounter (INDEPENDENT_AMBULATORY_CARE_PROVIDER_SITE_OTHER): Payer: Self-pay | Admitting: Physician Assistant

## 2016-04-20 ENCOUNTER — Encounter (INDEPENDENT_AMBULATORY_CARE_PROVIDER_SITE_OTHER): Payer: Self-pay

## 2016-04-20 DIAGNOSIS — Z96641 Presence of right artificial hip joint: Secondary | ICD-10-CM

## 2016-04-20 MED ORDER — OXYCODONE HCL 5 MG PO TABS: 10.0000 mg | ORAL_TABLET | Freq: Four times a day (QID) | ORAL | 0 refills | Status: DC | PRN

## 2016-04-20 NOTE — Progress Notes (Signed)
Office Visit Note   Patient: Brenda Odonnell           Date of Birth: [birth year]           MRN: [id number] Visit Date: 04/20/2016              Requested by: Antonietta Jewel, MD [address] [place],  [address] PCP: Antonietta Jewel, MD   Assessment & Plan: Visit Diagnoses:  1. Presence of right artificial hip joint     Plan: Continue to work with PT add back and stretching exercises. Handout is given on some simple back exercises . Refill on oxycodone was given. She is already on gabapentin 300 mg 3 times daily  Follow-Up Instructions: Return in about 4 weeks (around 05/18/2016).   Orders:  No orders of the defined types were placed in this encounter.  Meds ordered this encounter  Medications  . oxyCODONE (OXY IR/ROXICODONE) 5 MG immediate release tablet    Sig: Take 2 tablets (10 mg total) by mouth every 6 (six) hours as needed for severe pain or breakthrough pain.    Dispense:  60 tablet    Refill:  0      Procedures: No procedures performed   Clinical Data: No additional findings.   Subjective: Chief Complaint  Patient presents with  . Right Hip - Routine Post Op    HPI Brenda Odonnell is just over one month status post right total hip arthroplasty. She's had a few falls since she was last seen is her back is bothering her she's having some burning pain down the leg. He had workup for lumbar spine in the past showed degenerative changes with facet arthropathy most severe at L3-4 and L4-5 on MRI. Currently on Neurontin 300 mg 3 times daily. She is working with physical therapy.    Review of Systems   Objective: Vital Signs: There were no vitals taken for this visit.  Physical Exam  Ortho Exam Surgical incision is healing well no signs of infection. Fluid range of motion of the right hip. Dorsiflexion plantar flexion of the right foot intact. She walks with the aid of a walker Specialty Comments:  No specialty comments available.  Imaging: No results  found.   PMFS History: Patient Active Problem List   Diagnosis Date Noted  . Unilateral primary osteoarthritis, right hip 03/13/2016  . Status post total replacement of right hip 03/13/2016  . Essential hypertension 05/26/2015  . Shingles 12/11/2014  . Osteoarthritis of left knee 11/23/2014  . Status post total left knee replacement 11/23/2014  . Dysphagia, pharyngoesophageal phase 09/07/2014  . Spinal stenosis in cervical region 09/07/2014  . BV (bacterial vaginosis) 04/03/2014  . Superficial fungus infection of skin 04/02/2014  . Candida vaginitis 04/02/2014  . Pelvic pain in female 02/13/2014  . Abnormality of gait 12/11/2013  . Urinary incontinence 12/11/2013  . Memory loss 12/11/2013  . Drowsiness 12/07/2012  . Dizziness and giddiness 12/07/2012  . Major depressive disorder, recurrent episode, severe ([place]) 12/01/2012  . Overdose 12/01/2012  . Urinary urgency 10/12/2012  . Carbuncle 10/12/2012  . Headache 05/19/2012  . Spinal stenosis   . Glaucoma   . Carpal tunnel syndrome    Past Medical History:  Diagnosis Date  . Arthritis   . Arthritis   . Bipolar 2 disorder ([place])   . Carpal tunnel syndrome   . Depression   . GERD (gastroesophageal reflux disease)   . Glaucoma   . Glaucoma   . Hypertension   .  Leiomyoma of uterus, unspecified   . Migraine   . Obesity   . Sinus infection   . Sleep apnea   . Spinal stenosis   . Spinal stenosis in cervical region 09/07/2014   C5-6 level    Family History  Problem Relation Age of Onset  . Hypertension Father   . Hyperlipidemia Father   . Fibromyalgia Mother   . Diabetes Mother   . Healthy Sister   . Healthy Brother   . Hypertension      Past Surgical History:  Procedure Laterality Date  . ABDOMINAL HYSTERECTOMY    . BREAST REDUCTION SURGERY    . CARPAL TUNNEL RELEASE Bilateral   . CESAREAN SECTION    . KNEE SURGERY Left    arthroscopic  . MYELOGRAM    . TOTAL HIP ARTHROPLASTY Right 03/13/2016   Procedure:  RIGHT TOTAL HIP ARTHROPLASTY ANTERIOR APPROACH;  Surgeon: Mcarthur Rossetti, MD;  Location: [facility];  Service: Orthopedics;  Laterality: Right;  . TOTAL KNEE ARTHROPLASTY Left 11/23/2014   Procedure: LEFT TOTAL KNEE ARTHROPLASTY;  Surgeon: Mcarthur Rossetti, MD;  Location: [facility];  Service: Orthopedics;  Laterality: Left;   Social History   Occupational History  .  Unemployed   Social History Main Topics  . Smoking status: Never Smoker  . Smokeless tobacco: Never Used  . Alcohol use Yes     Comment: socially, red wine  . Drug use: No  . Sexual activity: Yes    Partners: Male    Birth control/ protection: Surgical

## 2016-04-30 ENCOUNTER — Telehealth (INDEPENDENT_AMBULATORY_CARE_PROVIDER_SITE_OTHER): Payer: Self-pay | Admitting: Physician Assistant

## 2016-04-30 ENCOUNTER — Ambulatory Visit (INDEPENDENT_AMBULATORY_CARE_PROVIDER_SITE_OTHER): Payer: Medicare HMO | Admitting: Physician Assistant

## 2016-04-30 ENCOUNTER — Encounter (INDEPENDENT_AMBULATORY_CARE_PROVIDER_SITE_OTHER): Payer: Self-pay | Admitting: Physician Assistant

## 2016-04-30 ENCOUNTER — Ambulatory Visit (INDEPENDENT_AMBULATORY_CARE_PROVIDER_SITE_OTHER): Payer: Medicare HMO

## 2016-04-30 DIAGNOSIS — Z96641 Presence of right artificial hip joint: Secondary | ICD-10-CM

## 2016-04-30 NOTE — Progress Notes (Signed)
Office Visit Note   Patient: Brenda Odonnell           Date of Birth: 13-Jul-1965           MRN: [id number] Visit Date: 04/30/2016              Requested by: Antonietta Jewel, MD [address], [place], [place] [address] PCP: Antonietta Jewel, MD   Assessment & Plan: Visit Diagnoses:  1. Presence of right artificial hip joint     Plan:She would benefit from continued physical therapy for gait balance and IT band stretching. We will see if she can qualify for home physical therapyas she has difficulty with transportation.See her back in a month check progress lack of.  Follow-Up Instructions: Return in about 4 weeks (around 05/28/2016).   Orders:  Orders Placed This Encounter  Procedures  . XR HIP UNILAT W OR W/O PELVIS 2-3 VIEWS RIGHT   No orders of the defined types were placed in this encounter.     Procedures: No procedures performed   Clinical Data: No additional findings.   Subjective: Chief Complaint  Patient presents with  . Right Hip - Routine Post Op    HPI  Status post right total hip arthroplasty 03/13/2016. Complaining of right lateral hip pain. Not able to bear full weight on her right leg. Continues to use a rolling walker with a seat. Her care physician sent her for  Another Doppler she reports this was negative for DVT   Review of Systems   Objective: Vital Signs: There were no vitals taken for this visit.  Physical Exam  Ortho Exam Right hip surgical incision is healing well no  Sign of infection. Good range of motion of the right hip without pain. Tenderness over the right trochanteric region. Right calf supple nontender.Edema of the right foot is present. Specialty Comments:  No specialty comments available.  Imaging: Xr Hip Unilat W Or W/o Pelvis 2-3 Views Right  Result Date: 04/30/2016 AP pelvis and lateral view of the right hip: no acute fracture. Well-seated right total hip arthroplasty.    PMFS History: Patient Active Problem List     Diagnosis Date Noted  . Unilateral primary osteoarthritis, right hip 03/13/2016  . Status post total replacement of right hip 03/13/2016  . Essential hypertension 05/26/2015  . Shingles 12/11/2014  . Osteoarthritis of left knee 11/23/2014  . Status post total left knee replacement 11/23/2014  . Dysphagia, pharyngoesophageal phase 09/07/2014  . Spinal stenosis in cervical region 09/07/2014  . BV (bacterial vaginosis) 04/03/2014  . Superficial fungus infection of skin 04/02/2014  . Candida vaginitis 04/02/2014  . Pelvic pain in female 02/13/2014  . Abnormality of gait 12/11/2013  . Urinary incontinence 12/11/2013  . Memory loss 12/11/2013  . Drowsiness 12/07/2012  . Dizziness and giddiness 12/07/2012  . Major depressive disorder, recurrent episode, severe ([place]) 12/01/2012  . Overdose 12/01/2012  . Urinary urgency 10/12/2012  . Carbuncle 10/12/2012  . Headache 05/19/2012  . Spinal stenosis   . Glaucoma   . Carpal tunnel syndrome    Past Medical History:  Diagnosis Date  . Arthritis   . Arthritis   . Bipolar 2 disorder ([place])   . Carpal tunnel syndrome   . Depression   . GERD (gastroesophageal reflux disease)   . Glaucoma   . Glaucoma   . Hypertension   . Leiomyoma of uterus, unspecified   . Migraine   . Obesity   . Sinus infection   . Sleep apnea   .  Spinal stenosis   . Spinal stenosis in cervical region 09/07/2014   C5-6 level    Family History  Problem Relation Age of Onset  . Hypertension Father   . Hyperlipidemia Father   . Fibromyalgia Mother   . Diabetes Mother   . Healthy Sister   . Healthy Brother   . Hypertension      Past Surgical History:  Procedure Laterality Date  . ABDOMINAL HYSTERECTOMY    . BREAST REDUCTION SURGERY    . CARPAL TUNNEL RELEASE Bilateral   . CESAREAN SECTION    . KNEE SURGERY Left    arthroscopic  . MYELOGRAM    . TOTAL HIP ARTHROPLASTY Right 03/13/2016   Procedure: RIGHT TOTAL HIP ARTHROPLASTY ANTERIOR APPROACH;  Surgeon:  Mcarthur Rossetti, MD;  Location: [facility];  Service: Orthopedics;  Laterality: Right;  . TOTAL KNEE ARTHROPLASTY Left 11/23/2014   Procedure: LEFT TOTAL KNEE ARTHROPLASTY;  Surgeon: Mcarthur Rossetti, MD;  Location: [facility];  Service: Orthopedics;  Laterality: Left;   Social History   Occupational History  .  Unemployed   Social History Main Topics  . Smoking status: Never Smoker  . Smokeless tobacco: Never Used  . Alcohol use Yes     Comment: socially, red wine  . Drug use: No  . Sexual activity: Yes    Partners: Male    Birth control/ protection: Surgical

## 2016-05-01 ENCOUNTER — Other Ambulatory Visit (INDEPENDENT_AMBULATORY_CARE_PROVIDER_SITE_OTHER): Payer: Self-pay

## 2016-05-01 MED ORDER — OXYCODONE HCL 5 MG PO TABS: 5.0000 mg | ORAL_TABLET | Freq: Four times a day (QID) | ORAL | 0 refills | Status: AC | PRN

## 2016-05-01 NOTE — Telephone Encounter (Signed)
Yes we can refill #60 zero refills

## 2016-05-01 NOTE — Telephone Encounter (Signed)
Patient aware this is ready  

## 2016-05-01 NOTE — Telephone Encounter (Signed)
Please advise, I can get someone to do this if you want it filled?

## 2016-05-11 ENCOUNTER — Telehealth (INDEPENDENT_AMBULATORY_CARE_PROVIDER_SITE_OTHER): Payer: Self-pay

## 2016-05-11 NOTE — Telephone Encounter (Signed)
Would like verbal orders for patient, 2 x week for 2 wks, then 1 x week for [phone]

## 2016-05-12 NOTE — Telephone Encounter (Signed)
Called and left voicemail giving Merry Proud verbal okay for patient.

## 2016-05-12 NOTE — Telephone Encounter (Signed)
Can you please just call and give "verbal ok"

## 2016-05-18 ENCOUNTER — Ambulatory Visit (INDEPENDENT_AMBULATORY_CARE_PROVIDER_SITE_OTHER): Payer: Medicare HMO | Admitting: Physician Assistant

## 2016-05-28 ENCOUNTER — Ambulatory Visit (INDEPENDENT_AMBULATORY_CARE_PROVIDER_SITE_OTHER): Payer: Medicare HMO | Admitting: Physician Assistant

## 2016-05-28 ENCOUNTER — Encounter (INDEPENDENT_AMBULATORY_CARE_PROVIDER_SITE_OTHER): Payer: Self-pay | Admitting: Physician Assistant

## 2016-05-28 DIAGNOSIS — M545 Low back pain, unspecified: Secondary | ICD-10-CM

## 2016-05-28 DIAGNOSIS — Z9889 Other specified postprocedural states: Secondary | ICD-10-CM

## 2016-05-28 DIAGNOSIS — Z96641 Presence of right artificial hip joint: Secondary | ICD-10-CM

## 2016-05-28 DIAGNOSIS — G8929 Other chronic pain: Secondary | ICD-10-CM

## 2016-05-28 NOTE — Progress Notes (Signed)
Office Visit Note   Patient: Brenda Odonnell           Date of Birth: 03-Apr-1966           MRN: [id number] Visit Date: 05/28/2016              Requested by: Antonietta Jewel, MD [address], [place], [place] [address] PCP: Antonietta Jewel, MD   Assessment & Plan: Visit Diagnoses:  1. History of arthroplasty of right hip   2. Chronic low back pain without sciatica, unspecified back pain laterality     Plan: Send her to therapy for back for core strengthening range of motion stretching and modalities continued therapy for hip. Follow-up in a month check progress lack of if her back pain continues she may require an MRI to evaluate her back and possible epidural steroid injections in the future  Follow-Up Instructions: No Follow-up on file.   Orders:  No orders of the defined types were placed in this encounter.  No orders of the defined types were placed in this encounter.     Procedures: No procedures performed   Clinical Data: No additional findings.   Subjective: Chief Complaint  Patient presents with  . Right Hip - Routine Post Op  . Follow-up    HPI Brenda Odonnell returns today stating that her right hip is improving she comes in today ambulating with a cane and a rolling walker. States that her back is hurting her now hurts to walk or sit. She's had on and off back pain and previous plain film radiographs showed lower lumbar facet degenerative changes . She is having no real radicular symptoms down either leg . Review of Systems   Objective: Vital Signs: There were no vitals taken for this visit.  Physical Exam  Ortho Exam She is able to get up on her from a seated position and ambulate about the room without any assistive device. Right hip good range of motion. Right calf supple nontender .  Specialty Comments:  No specialty comments available.  Imaging: No results found.   PMFS History: Patient Active Problem List   Diagnosis Date Noted  . Unilateral  primary osteoarthritis, right hip 03/13/2016  . Status post total replacement of right hip 03/13/2016  . Essential hypertension 05/26/2015  . Shingles 12/11/2014  . Osteoarthritis of left knee 11/23/2014  . Status post total left knee replacement 11/23/2014  . Dysphagia, pharyngoesophageal phase 09/07/2014  . Spinal stenosis in cervical region 09/07/2014  . BV (bacterial vaginosis) 04/03/2014  . Superficial fungus infection of skin 04/02/2014  . Candida vaginitis 04/02/2014  . Pelvic pain in female 02/13/2014  . Abnormality of gait 12/11/2013  . Urinary incontinence 12/11/2013  . Memory loss 12/11/2013  . Drowsiness 12/07/2012  . Dizziness and giddiness 12/07/2012  . Major depressive disorder, recurrent episode, severe ([place]) 12/01/2012  . Overdose 12/01/2012  . Urinary urgency 10/12/2012  . Carbuncle 10/12/2012  . Headache 05/19/2012  . Spinal stenosis   . Glaucoma   . Carpal tunnel syndrome    Past Medical History:  Diagnosis Date  . Arthritis   . Arthritis   . Bipolar 2 disorder ([place])   . Carpal tunnel syndrome   . Depression   . GERD (gastroesophageal reflux disease)   . Glaucoma   . Glaucoma   . Hypertension   . Leiomyoma of uterus, unspecified   . Migraine   . Obesity   . Sinus infection   . Sleep apnea   . Spinal  stenosis   . Spinal stenosis in cervical region 09/07/2014   C5-6 level    Family History  Problem Relation Age of Onset  . Hypertension Father   . Hyperlipidemia Father   . Fibromyalgia Mother   . Diabetes Mother   . Healthy Sister   . Healthy Brother   . Hypertension      Past Surgical History:  Procedure Laterality Date  . ABDOMINAL HYSTERECTOMY    . BREAST REDUCTION SURGERY    . CARPAL TUNNEL RELEASE Bilateral   . CESAREAN SECTION    . KNEE SURGERY Left    arthroscopic  . MYELOGRAM    . TOTAL HIP ARTHROPLASTY Right 03/13/2016   Procedure: RIGHT TOTAL HIP ARTHROPLASTY ANTERIOR APPROACH;  Surgeon: Mcarthur Rossetti, MD;  Location:  [facility];  Service: Orthopedics;  Laterality: Right;  . TOTAL KNEE ARTHROPLASTY Left 11/23/2014   Procedure: LEFT TOTAL KNEE ARTHROPLASTY;  Surgeon: Mcarthur Rossetti, MD;  Location: [facility];  Service: Orthopedics;  Laterality: Left;   Social History   Occupational History  .  Unemployed   Social History Main Topics  . Smoking status: Never Smoker  . Smokeless tobacco: Never Used  . Alcohol use Yes     Comment: socially, red wine  . Drug use: No  . Sexual activity: Yes    Partners: Male    Birth control/ protection: Surgical

## 2016-06-29 ENCOUNTER — Ambulatory Visit (INDEPENDENT_AMBULATORY_CARE_PROVIDER_SITE_OTHER): Payer: Medicare HMO | Admitting: Physician Assistant

## 2016-08-08 ENCOUNTER — Other Ambulatory Visit: Payer: Self-pay | Admitting: Neurology

## 2016-08-10 NOTE — Telephone Encounter (Signed)
Called [facility]. Spoke with pharmacy tech. They stated patient last received rx baclofen on July 10, 2016. Original prescription written on 03/02/16 by CW,MD. Patient has no more refills of medication.

## 2016-09-16 ENCOUNTER — Ambulatory Visit (INDEPENDENT_AMBULATORY_CARE_PROVIDER_SITE_OTHER): Payer: Medicare HMO | Admitting: Orthopaedic Surgery

## 2016-09-16 DIAGNOSIS — Z96641 Presence of right artificial hip joint: Secondary | ICD-10-CM

## 2016-09-16 DIAGNOSIS — M5441 Lumbago with sciatica, right side: Secondary | ICD-10-CM | POA: Diagnosis not present

## 2016-09-16 DIAGNOSIS — M5442 Lumbago with sciatica, left side: Secondary | ICD-10-CM | POA: Diagnosis not present

## 2016-09-16 DIAGNOSIS — G8929 Other chronic pain: Secondary | ICD-10-CM

## 2016-09-16 DIAGNOSIS — Z96652 Presence of left artificial knee joint: Secondary | ICD-10-CM

## 2016-09-16 NOTE — Progress Notes (Signed)
The patient is well-known to me. She is someone who we performed a left total knee replacement and a right total hip replacement. She is morbidly obese and has chronic pain issues as well as back pain. She has arm pin as well. She is on Lyrica, baclofen, and oxycodone. She was in [place] recently had a fall and everything is been hurting since then. She does take an anti-inflammatory as well. Exam of lidocaine.  On examination she has global pain almost everywhere between her extremities. However her right hip is stable as is her left total knee replacement.  She is someone that would deathly benefit from physical therapy and weight loss. She would benefit from a chronic tens unit as well. We'll try to send her to physical therapy and see if they can get her a TENS unit and I'll see her back in about 6 weeks to see how she doing overall.

## 2016-09-17 ENCOUNTER — Other Ambulatory Visit (INDEPENDENT_AMBULATORY_CARE_PROVIDER_SITE_OTHER): Payer: Self-pay

## 2016-09-17 DIAGNOSIS — G8929 Other chronic pain: Secondary | ICD-10-CM

## 2016-09-17 DIAGNOSIS — G894 Chronic pain syndrome: Secondary | ICD-10-CM

## 2016-09-17 DIAGNOSIS — M545 Low back pain, unspecified: Secondary | ICD-10-CM

## 2016-09-24 ENCOUNTER — Ambulatory Visit: Payer: Medicare HMO | Attending: Internal Medicine

## 2016-10-05 ENCOUNTER — Encounter: Payer: Self-pay | Admitting: Neurology

## 2016-10-05 ENCOUNTER — Other Ambulatory Visit: Payer: Self-pay | Admitting: Neurological Surgery

## 2016-10-05 ENCOUNTER — Ambulatory Visit (INDEPENDENT_AMBULATORY_CARE_PROVIDER_SITE_OTHER): Payer: Medicare HMO | Admitting: Neurology

## 2016-10-05 VITALS — BP 123/79 | HR 59 | Ht 66.0 in | Wt 329.0 lb

## 2016-10-05 DIAGNOSIS — R269 Unspecified abnormalities of gait and mobility: Secondary | ICD-10-CM | POA: Diagnosis not present

## 2016-10-05 DIAGNOSIS — M4712 Other spondylosis with myelopathy, cervical region: Secondary | ICD-10-CM

## 2016-10-05 NOTE — Patient Instructions (Signed)
   We will set up MRI of the neck.

## 2016-10-05 NOTE — Progress Notes (Signed)
Reason for visit: Gait disorder  Brenda Odonnell is an 51 y.o. female  History of present illness:  Brenda Odonnell is a 51 year old right-handed black female with a history of morbid obesity and cervical spinal stenosis. The patient has recently undergone a right total hip replacement in November 2017, she believes that after the surgery her walking seemed to worsen, but she clearly was having gait problems well before this. Since January 2018 this has worsened even further, she is falling with some regularity. The patient uses a cane for ambulation. The patient has had EMG and nerve conduction studies that did not show evidence of a right cervical radiculopathy or evidence of a peripheral neuropathy. The patient reports some discomfort and numbness involving the right hand and forearm. The patient feels somewhat weak in the right arm. She denies issues controlling the bowels or the bladder. She will be entering back into physical therapy in the next week or so. She returns for an evaluation.   Past Medical History:  Diagnosis Date  . Arthritis   . Arthritis   . Bipolar 2 disorder ([place])   . Carpal tunnel syndrome   . Depression   . GERD (gastroesophageal reflux disease)   . Glaucoma   . Glaucoma   . Hypertension   . Leiomyoma of uterus, unspecified   . Migraine   . Obesity   . Sinus infection   . Sleep apnea   . Spinal stenosis   . Spinal stenosis in cervical region 09/07/2014   C5-6 level    Past Surgical History:  Procedure Laterality Date  . ABDOMINAL HYSTERECTOMY    . BREAST REDUCTION SURGERY    . CARPAL TUNNEL RELEASE Bilateral   . CESAREAN SECTION    . KNEE SURGERY Left    arthroscopic  . MYELOGRAM    . TOTAL HIP ARTHROPLASTY Right 03/13/2016   Procedure: RIGHT TOTAL HIP ARTHROPLASTY ANTERIOR APPROACH;  Surgeon: Mcarthur Rossetti, MD;  Location: [facility];  Service: Orthopedics;  Laterality: Right;  . TOTAL KNEE ARTHROPLASTY Left 11/23/2014   Procedure: LEFT TOTAL KNEE  ARTHROPLASTY;  Surgeon: Mcarthur Rossetti, MD;  Location: [facility];  Service: Orthopedics;  Laterality: Left;    Family History  Problem Relation Age of Onset  . Hypertension Father   . Hyperlipidemia Father   . Fibromyalgia Mother   . Diabetes Mother   . Healthy Sister   . Healthy Brother   . Hypertension Unknown     Social history:  reports that she has never smoked. She has never used smokeless tobacco. She reports that she drinks alcohol. She reports that she does not use drugs.    Allergies  Allergen Reactions  . Ibuprofen Nausea And Vomiting    Due to Jerrye Bushy    Medications:  Prior to Admission medications   Medication Sig Start Date End Date Taking? Authorizing Provider  ALPRAZolam (XANAX) 0.25 MG tablet Take 0.25 mg by mouth 3 (three) times daily as needed for anxiety.  09/12/15   [provider]  amphetamine-dextroamphetamine (ADDERALL) 10 MG tablet Take 10 mg by mouth daily at 12 noon.    [provider]  amphetamine-dextroamphetamine (ADDERALL) 30 MG tablet Take 30 mg by mouth every morning.    [provider]  antiseptic oral rinse (BIOTENE) LIQD 15 mLs by Mouth Rinse route as needed for dry mouth.    [provider]  aspirin 81 MG chewable tablet Chew 1 tablet (81 mg total) by mouth 2 (two) times daily.  Patient not taking: Reported on 04/30/2016 03/16/16   Mcarthur Rossetti, MD  atenolol (TENORMIN) 100 MG tablet Take 100 mg by mouth daily.  12/10/15   [provider]  baclofen (LIORESAL) 20 MG tablet Take 1 tablet (20 mg total) by mouth 3 (three) times daily. 03/16/16   Mcarthur Rossetti, MD  baclofen (LIORESAL) 20 MG tablet TAKE 1 TABLET (20 MG TOTAL) BY MOUTH 3 (THREE) TIMES DAILY. 08/10/16   Kathrynn Ducking, MD  COMBIGAN 0.2-0.5 % ophthalmic solution Place 1 drop into both eyes 2 (two) times daily. 12/10/15   [provider]  CONSTULOSE 10 GM/15ML solution Take 30 g by mouth daily as needed for moderate  constipation.  06/17/15   [provider]  dorzolamide (TRUSOPT) 2 % ophthalmic solution Place 1 drop into both eyes 2 (two) times daily.     [provider]  DULoxetine (CYMBALTA) 60 MG capsule TAKE 1 TABLET DAILY 12/01/15   [provider]  furosemide (LASIX) 40 MG tablet Take 40 mg by mouth daily as needed for fluid.  09/07/15   [provider]  gabapentin (NEURONTIN) 300 MG capsule Take 1 capsule (300 mg total) by mouth 3 (three) times daily. Patient not taking: Reported on 04/30/2016 12/25/15   Kathrynn Ducking, MD  hydrocortisone (PROCTO-PAK) 1 % CREA Apply 1 Applicatorful topically 2 (two) times daily. 08/28/15   Shelly Bombard, MD  lactulose (CEPHULAC) 10 g packet Take 10 g by mouth 3 (three) times daily.    [provider]  lamoTRIgine (LAMICTAL) 200 MG tablet Take 200 mg by mouth daily.    [provider]  lisinopril (PRINIVIL,ZESTRIL) 40 MG tablet Take 1 tablet (40 mg total) by mouth daily. 12/05/12   Nena Polio T, PA-C  lisinopril (PRINIVIL,ZESTRIL) 40 MG tablet Take 40 mg by mouth daily.    [provider]  LYRICA 150 MG capsule Take 150 mg by mouth 2 (two) times daily. 02/06/16   [provider]  nabumetone (RELAFEN) 750 MG tablet  04/07/16   [provider]  omeprazole (PRILOSEC) 40 MG capsule Take 1 capsule (40 mg total) by mouth daily. 10/26/14   Shelly Bombard, MD  ondansetron (ZOFRAN) 4 MG tablet Take 1 tablet (4 mg total) by mouth every 8 (eight) hours as needed for nausea or vomiting. 10/26/14   Shelly Bombard, MD  oxyCODONE (OXY IR/ROXICODONE) 5 MG immediate release tablet Take 1 tablet (5 mg total) by mouth every 6 (six) hours as needed for severe pain or breakthrough pain. 05/01/16   Leandrew Koyanagi, MD  PRADAXA 150 MG CAPS capsule  04/07/16   [provider]  QUEtiapine (SEROQUEL) 25 MG tablet Take 1 tablet (25 mg total) by mouth at bedtime. Patient not taking: Reported on 04/30/2016  03/02/16   Kathrynn Ducking, MD  RESTASIS MULTIDOSE 0.05 % ophthalmic emulsion INSTILL 1 DROP IN BOTH EYES EVERY 12 HOURS DAILY. 02/16/16   [provider]  topiramate (TOPAMAX) 200 MG tablet Take 200 mg by mouth daily.     [provider]  zolpidem (AMBIEN) 10 MG tablet Take 10 mg by mouth at bedtime as needed.     [provider]    ROS:  Out of a complete 14 system review of symptoms, the patient complains only of the following symptoms, and all other reviewed systems are negative.  Fatigue Ringing in the ears Loss of vision, blurred vision Restless legs, insomnia, sleep apnea Joint pain, joint swelling,  back pain, achy muscles, muscle cramps, walking difficulty, neck pain, neck stiffness Memory loss, weakness Agitation, decreased concentration, depression, anxiety  Blood pressure 123/79, pulse (!) 59, height 5\' 6"  (1.676 m), weight (!) 329 lb (149.2 kg).  Physical Exam  General: The patient is alert and cooperative at the time of the examination. The patient is morbidly obese.  Skin: 1+ edema in the ankles is seen bilaterally.   Neurologic Exam  Mental status: The patient is alert and oriented x 3 at the time of the examination. The patient has apparent normal recent and remote memory, with an apparently normal attention span and concentration ability.   Cranial nerves: Facial symmetry is present. Speech is normal, no aphasia or dysarthria is noted. Extraocular movements are full. Visual fields are full.  Motor: The patient has good strength in all 4 extremities.  Sensory examination: Soft touch sensation is symmetric on the face, arms, and legs, with exception of some decrease soft touch sensation on the right forearm.  Coordination: The patient has good finger-nose-finger and heel-to-shin bilaterally.  Gait and station: The patient has a slightly wide-based gait, the patient uses a cane for ambulation. Tandem gait is unsteady, Romberg is  unsteady, the patient has a tendency to fall backwards.  Reflexes: Deep tendon reflexes are symmetric, but are depressed.   MRI cervical 03/24/15:  IMPRESSION:  This is an abnormal MRI of the cervical spine showing the following: 1.   Moderate spinal stenosis at C5-C6 of 7.4 mm due to disc protrusion and uncovertebral spurring. There is mild foraminal narrowing at this level but no definite nerve root compression. The extent of spinal stenosis has progressed since the MRI dated 06/27/2012 when it measured 7.9 mm. 2.  Mild spinal stenosis at C4-C5 measuring 8.4 mm due to disc bulging, uncovertebral spurring and possibly congenital show narrowing of the spinal canal. There is no nerve root compression. This has regressed slightly compared to 06/27/2012. 3.  Small right paramedian disc protrusion at C6-C7 that has occurred since the 06/27/2012 MRI.  * MRI scan images were reviewed online. I agree with the written report.    Assessment/Plan:  1. Morbid obesity  2. Gait disturbance  3. History of cervical spondylosis, cervical spinal stenosis  The patient does report neck pain and low back pain, she will be sent for MRI of the cervical spine, she will be entering into physical therapy in the near future. She will follow-up in 4-5 months, I will call her with the results of the MRI once available.  Jill Alexanders MD 10/05/2016 11:02 AM  [facility] [address] [place], [place] [address]  Phone [phone] Fax [phone]

## 2016-10-08 ENCOUNTER — Other Ambulatory Visit: Payer: Self-pay | Admitting: Neurology

## 2016-10-15 ENCOUNTER — Other Ambulatory Visit: Payer: Self-pay

## 2016-10-23 ENCOUNTER — Ambulatory Visit: Payer: Medicare HMO | Admitting: Nurse Practitioner

## 2016-10-26 ENCOUNTER — Ambulatory Visit (INDEPENDENT_AMBULATORY_CARE_PROVIDER_SITE_OTHER): Payer: Medicare HMO | Admitting: Orthopaedic Surgery

## 2016-10-26 ENCOUNTER — Encounter: Payer: Self-pay | Admitting: Nurse Practitioner

## 2016-10-29 ENCOUNTER — Ambulatory Visit
Admission: RE | Admit: 2016-10-29 | Discharge: 2016-10-29 | Disposition: A | Discharge status: Home / Self Care | Payer: Medicare HMO | Source: Ambulatory Visit | Attending: Neurological Surgery | Admitting: Neurological Surgery

## 2016-10-29 DIAGNOSIS — M4712 Other spondylosis with myelopathy, cervical region: Secondary | ICD-10-CM

## 2016-11-09 ENCOUNTER — Telehealth: Payer: Self-pay | Admitting: Neurology

## 2016-11-09 NOTE — Telephone Encounter (Signed)
I called the patient. The MRI the cervical spine shows spinal stenosis at the C4-5 and C5-6 levels, the spinal cord is getting flattened at these levels, there is evidence of cord signal at the C5-6 level indicating spinal cord injury.  The patient does have balance issues, she does have arm pain, this study likely explains these issues. She will be seen by Dr. Ellene Route within the next 2 weeks.  MRI cervical 10/29/16:  IMPRESSION: 1. 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 Minimal patchy T2 signal abnormality within the cervical spinal cord at the level C5-6 suspicious for myelomalacia. 2. Moderate right C5 foraminal stenosis related to chronic disc osteophyte.

## 2016-11-09 NOTE — Telephone Encounter (Signed)
Patient calling to discuss MRI results

## 2016-11-13 ENCOUNTER — Ambulatory Visit: Payer: Medicare HMO | Admitting: Obstetrics

## 2016-11-23 ENCOUNTER — Emergency Department (HOSPITAL_COMMUNITY)
Admission: EM | Admit: 2016-11-23 | Discharge: 2016-11-23 | Disposition: A | Discharge status: Home / Self Care | Payer: Medicare HMO | Attending: Emergency Medicine | Admitting: Emergency Medicine

## 2016-11-23 ENCOUNTER — Emergency Department (HOSPITAL_COMMUNITY): Payer: Medicare HMO

## 2016-11-23 ENCOUNTER — Encounter (HOSPITAL_COMMUNITY): Payer: Self-pay | Admitting: Emergency Medicine

## 2016-11-23 DIAGNOSIS — Z7983 Long term (current) use of bisphosphonates: Secondary | ICD-10-CM | POA: Insufficient documentation

## 2016-11-23 DIAGNOSIS — M79601 Pain in right arm: Secondary | ICD-10-CM

## 2016-11-23 DIAGNOSIS — Z79899 Other long term (current) drug therapy: Secondary | ICD-10-CM | POA: Diagnosis not present

## 2016-11-23 DIAGNOSIS — I10 Essential (primary) hypertension: Secondary | ICD-10-CM | POA: Diagnosis not present

## 2016-11-23 MED ORDER — METHOCARBAMOL 500 MG PO TABS: 500.0000 mg | ORAL_TABLET | Freq: Two times a day (BID) | ORAL | 0 refills | Status: AC

## 2016-11-23 NOTE — ED Provider Notes (Signed)
[facility] DEPT Provider Note   CSN: [id number] Arrival date & time: 11/23/16  1941     History   Chief Complaint Chief Complaint  Patient presents with  . Arm Pain    HPI Brenda Odonnell is a 51 y.o. female.  HPI  Patient presents to ED for evaluation of one month of right-sided arm and elbow pain that has gradually worsened. She reports sharp pain in the areas. She reports weakness in her right hand which she states is chronic due to her cervical spinal stenosis. She denies any injury, numbness, trauma to the area. She has tried taking her narcotic pain medication, ice and heat to the area with mild improvement in symptoms. She is seen orthopedist in the past and have told her that this is due to the cervical stenosis that she has. She denies any other symptoms at this time.  Past Medical History:  Diagnosis Date  . Arthritis   . Arthritis   . Bipolar 2 disorder ([place])   . Carpal tunnel syndrome   . Depression   . GERD (gastroesophageal reflux disease)   . Glaucoma   . Glaucoma   . Hypertension   . Leiomyoma of uterus, unspecified   . Migraine   . Obesity   . Sinus infection   . Sleep apnea   . Spinal stenosis   . Spinal stenosis in cervical region 09/07/2014   C5-6 level    Patient Active Problem List   Diagnosis Date Noted  . Unilateral primary osteoarthritis, right hip 03/13/2016  . Status post total replacement of right hip 03/13/2016  . Essential hypertension 05/26/2015  . Shingles 12/11/2014  . Osteoarthritis of left knee 11/23/2014  . Status post total left knee replacement 11/23/2014  . Dysphagia, pharyngoesophageal phase 09/07/2014  . Spinal stenosis in cervical region 09/07/2014  . BV (bacterial vaginosis) 04/03/2014  . Superficial fungus infection of skin 04/02/2014  . Candida vaginitis 04/02/2014  . Pelvic pain in female 02/13/2014  . Abnormality of gait 12/11/2013  . Urinary incontinence 12/11/2013  . Memory loss 12/11/2013  . Drowsiness  12/07/2012  . Dizziness and giddiness 12/07/2012  . Major depressive disorder, recurrent episode, severe ([place]) 12/01/2012  . Overdose 12/01/2012  . Urinary urgency 10/12/2012  . Carbuncle 10/12/2012  . Headache 05/19/2012  . Spinal stenosis   . Glaucoma   . Carpal tunnel syndrome     Past Surgical History:  Procedure Laterality Date  . ABDOMINAL HYSTERECTOMY    . BREAST REDUCTION SURGERY    . CARPAL TUNNEL RELEASE Bilateral   . CESAREAN SECTION    . KNEE SURGERY Left    arthroscopic  . MYELOGRAM    . TOTAL HIP ARTHROPLASTY Right 03/13/2016   Procedure: RIGHT TOTAL HIP ARTHROPLASTY ANTERIOR APPROACH;  Surgeon: Mcarthur Rossetti, MD;  Location: [facility];  Service: Orthopedics;  Laterality: Right;  . TOTAL KNEE ARTHROPLASTY Left 11/23/2014   Procedure: LEFT TOTAL KNEE ARTHROPLASTY;  Surgeon: Mcarthur Rossetti, MD;  Location: [facility];  Service: Orthopedics;  Laterality: Left;    OB History    Gravida Para Term Preterm AB Living   3 3 3     3    SAB TAB Ectopic Multiple Live Births           3       Home Medications    Prior to Admission medications   Medication Sig Start Date End Date Taking? Authorizing Provider  ALPRAZolam (XANAX) 0.25 MG tablet Take 0.25 mg by mouth 3 (  three) times daily as needed for anxiety.  09/12/15   [provider]  amphetamine-dextroamphetamine (ADDERALL) 10 MG tablet Take 10 mg by mouth daily at 12 noon.    [provider]  amphetamine-dextroamphetamine (ADDERALL) 30 MG tablet Take 30 mg by mouth every morning.    [provider]  antiseptic oral rinse (BIOTENE) LIQD 15 mLs by Mouth Rinse route as needed for dry mouth.    [provider]  atenolol (TENORMIN) 100 MG tablet Take 100 mg by mouth daily.  12/10/15   [provider]  baclofen (LIORESAL) 20 MG tablet Take 1 tablet (20 mg total) by mouth 3 (three) times daily. 03/16/16   Mcarthur Rossetti, MD  baclofen (LIORESAL) 20 MG tablet TAKE 1  TABLET (20 MG TOTAL) BY MOUTH 3 (THREE) TIMES DAILY. 08/10/16   Kathrynn Ducking, MD  COMBIGAN 0.2-0.5 % ophthalmic solution Place 1 drop into both eyes 2 (two) times daily. 12/10/15   [provider]  CONSTULOSE 10 GM/15ML solution Take 30 g by mouth daily as needed for moderate constipation.  06/17/15   [provider]  dorzolamide (TRUSOPT) 2 % ophthalmic solution Place 1 drop into both eyes 2 (two) times daily.     [provider]  DULoxetine (CYMBALTA) 60 MG capsule TAKE 1 TABLET DAILY 12/01/15   [provider]  furosemide (LASIX) 40 MG tablet Take 40 mg by mouth daily as needed for fluid.  09/07/15   [provider]  gabapentin (NEURONTIN) 300 MG capsule Take 1 capsule (300 mg total) by mouth 3 (three) times daily. 12/25/15   Kathrynn Ducking, MD  hydrocortisone (PROCTO-PAK) 1 % CREA Apply 1 Applicatorful topically 2 (two) times daily. 08/28/15   Shelly Bombard, MD  lactulose (CEPHULAC) 10 g packet Take 10 g by mouth 3 (three) times daily.    [provider]  lamoTRIgine (LAMICTAL) 200 MG tablet Take 200 mg by mouth daily.    [provider]  lisinopril (PRINIVIL,ZESTRIL) 40 MG tablet Take 1 tablet (40 mg total) by mouth daily. 12/05/12   Nena Polio T, PA-C  lisinopril (PRINIVIL,ZESTRIL) 40 MG tablet Take 40 mg by mouth daily.    [provider]  LYRICA 150 MG capsule Take 150 mg by mouth 2 (two) times daily. 02/06/16   [provider]  methocarbamol (ROBAXIN) 500 MG tablet Take 1 tablet (500 mg total) by mouth 2 (two) times daily. 11/23/16   Jarquavious Fentress, PA-C  omeprazole (PRILOSEC) 40 MG capsule Take 1 capsule (40 mg total) by mouth daily. 10/26/14   Shelly Bombard, MD  ondansetron (ZOFRAN) 4 MG tablet Take 1 tablet (4 mg total) by mouth every 8 (eight) hours as needed for nausea or vomiting. 10/26/14   Shelly Bombard, MD  oxyCODONE (OXY IR/ROXICODONE) 5 MG immediate release tablet Take 1 tablet (5 mg total) by  mouth every 6 (six) hours as needed for severe pain or breakthrough pain. 05/01/16   Leandrew Koyanagi, MD  PRADAXA 150 MG CAPS capsule  04/07/16   [provider]  QUEtiapine (SEROQUEL) 25 MG tablet Take 1 tablet (25 mg total) by mouth at bedtime. 03/02/16   Kathrynn Ducking, MD  RESTASIS MULTIDOSE 0.05 % ophthalmic emulsion INSTILL 1 DROP IN BOTH EYES EVERY 12 HOURS DAILY. 02/16/16   [provider]  topiramate (TOPAMAX) 200 MG tablet Take 200 mg by mouth daily.     [provider]  zolpidem (AMBIEN) 10 MG tablet Take 10  mg by mouth at bedtime as needed.     [provider]    Family History Family History  Problem Relation Age of Onset  . Hypertension Father   . Hyperlipidemia Father   . Fibromyalgia Mother   . Diabetes Mother   . Healthy Sister   . Healthy Brother   . Hypertension Unknown     Social History Social History  Substance Use Topics  . Smoking status: Never Smoker  . Smokeless tobacco: Never Used  . Alcohol use Yes     Comment: socially, red wine     Allergies   Ibuprofen   Review of Systems Review of Systems  Constitutional: Negative for chills and fever.  Gastrointestinal: Negative for nausea and vomiting.  Musculoskeletal: Positive for arthralgias and myalgias.  Skin: Negative for color change and wound.  Neurological: Positive for weakness. Negative for numbness.     Physical Exam Updated Vital Signs BP 122/64 (BP Location: Left Arm)   Pulse 66   Temp 98.5 F (36.9 C) (Oral)   Resp 16   Ht 5\' 7"  (1.702 m)   Wt 131.1 kg (289 lb)   SpO2 100%   BMI 45.26 kg/m   Physical Exam  Constitutional: She appears well-developed and well-nourished. No distress.  HENT:  Head: Normocephalic and atraumatic.  Eyes: Conjunctivae and EOM are normal. No scleral icterus.  Neck: Normal range of motion.  Pulmonary/Chest: Effort normal. No respiratory distress.  Musculoskeletal: Normal range of motion. She exhibits tenderness.    Tenderness to palpation of the right forearm. Pain with supination. Otherwise full active and passive range of motion of the elbow and wrist. Sensation intact to light touch. Decreased grip strength in right hand. No visible deformity, edema, bruising or color change noted. No erythema or warmth of the joint. 2+ radial pulses bilaterally.  Neurological: She is alert.  Skin: No rash noted. She is not diaphoretic.  Psychiatric: She has a normal mood and affect.  Nursing note and vitals reviewed.    ED Treatments / Results  Labs (all labs ordered are listed, but only abnormal results are displayed) Labs Reviewed - No data to display  EKG  EKG Interpretation None       Radiology Dg Elbow Complete Right  Result Date: 11/23/2016 CLINICAL DATA:  Right elbow pain, tender to the touch EXAM: RIGHT ELBOW - COMPLETE 3+ VIEW COMPARISON:  None. FINDINGS: No fracture or malalignment. No significant elbow effusion. Probable mild spurring at the coronoid process. IMPRESSION: No definite acute osseous abnormality. Electronically Signed   By: Donavan Foil M.D.   On: 11/23/2016 20:47    Procedures Procedures (including critical care time)  Medications Ordered in ED Medications - No data to display   Initial Impression / Assessment and Plan / ED Course  I have reviewed the triage vital signs and the nursing notes.  Pertinent labs & imaging results that were available during my care of the patient were reviewed by me and considered in my medical decision making (see chart for details).     She presents to ED for evaluation of right arm pain that has been intermittent for the past month and has gradually worsened. She denies any injury, trauma to the area. She reports chronic decreased strength in her right hand due to cervical spinal stenosis that she has had for the past 6 years. She states that her PCP and orthopedist who told her that her arm pain is due to the stenosis. She has tried taking  her pain medications, applied ice and heat to the area with mild improvement in symptoms. On physical exam she is tender to palpation on the right forearm. There is no visible deformity, color or temperature change noted. She does have decreased grip strength on the right side. There is no erythema or swelling of the joints that would concern me for septic joint. Symptoms likely due to tendon inflammation or muscle strain. She is afebrile with no history of fever. She has pain with supination of the arm but otherwise full active and passive range of motion. Sensation intact to light touch. Good pulses bilaterally. X-ray of the elbow returned as negative for fracture dislocation. Due to patient's allergy to ibuprofen, will give him muscle relaxer and sling per patient request and advised her to follow-up with orthopedist for further evaluation. Patient appears stable for discharge at this time. Strict return precautions given.  Final Clinical Impressions(s) / ED Diagnoses   Final diagnoses:  Right arm pain    New Prescriptions New Prescriptions   METHOCARBAMOL (ROBAXIN) 500 MG TABLET    Take 1 tablet (500 mg total) by mouth 2 (two) times daily.     Delia Heady, PA-C 11/23/16 2302    Julianne Rice, MD 11/26/16 2240

## 2016-11-23 NOTE — ED Triage Notes (Signed)
Pt presents to ED for assessment of right elbow pain intermittent x 1 month, c/o pain to the tendon area below the elbow.  Pt has hx of chronic back pain and thought it may have been related to that.  Pt states she walks with her cane on the right side.

## 2016-11-23 NOTE — ED Notes (Signed)
Patient Alert and oriented X4. Stable and ambulatory. Patient verbalized understanding of the discharge instructions.  Patient belongings were taken by the patient.  

## 2016-11-23 NOTE — Discharge Instructions (Signed)
Please read attached information regarding your condition. Take Robaxin as needed. Wear sling as directed. Follow-up with orthopedist for further evaluation. Return to ED for worsening pain, numbness, weakness, injury, signs of infection or fever.

## 2016-11-25 ENCOUNTER — Other Ambulatory Visit: Payer: Self-pay | Admitting: *Deleted

## 2016-11-25 MED ORDER — BACLOFEN 20 MG PO TABS: 20.0000 mg | ORAL_TABLET | Freq: Three times a day (TID) | ORAL | 5 refills | Status: AC

## 2016-11-25 NOTE — Progress Notes (Signed)
The baclofen prescription will be refilled.

## 2016-12-03 ENCOUNTER — Ambulatory Visit: Payer: Medicare HMO | Admitting: Obstetrics

## 2017-07-29 IMAGING — RF DG HIP (WITH PELVIS) OPERATIVE*R*
1 series · 2 of 2 positions shown · non-contrast
Comparison: None.

CLINICAL DATA: Right total hip arthroplasty

EXAM:
OPERATIVE right HIP (WITH PELVIS IF PERFORMED) 1 VIEWS
TECHNIQUE: Fluoroscopic spot image(s) were submitted for interpretation
post-operatively.

[Series 1: run · 2 of 2 slices shown]
[im 1/2]
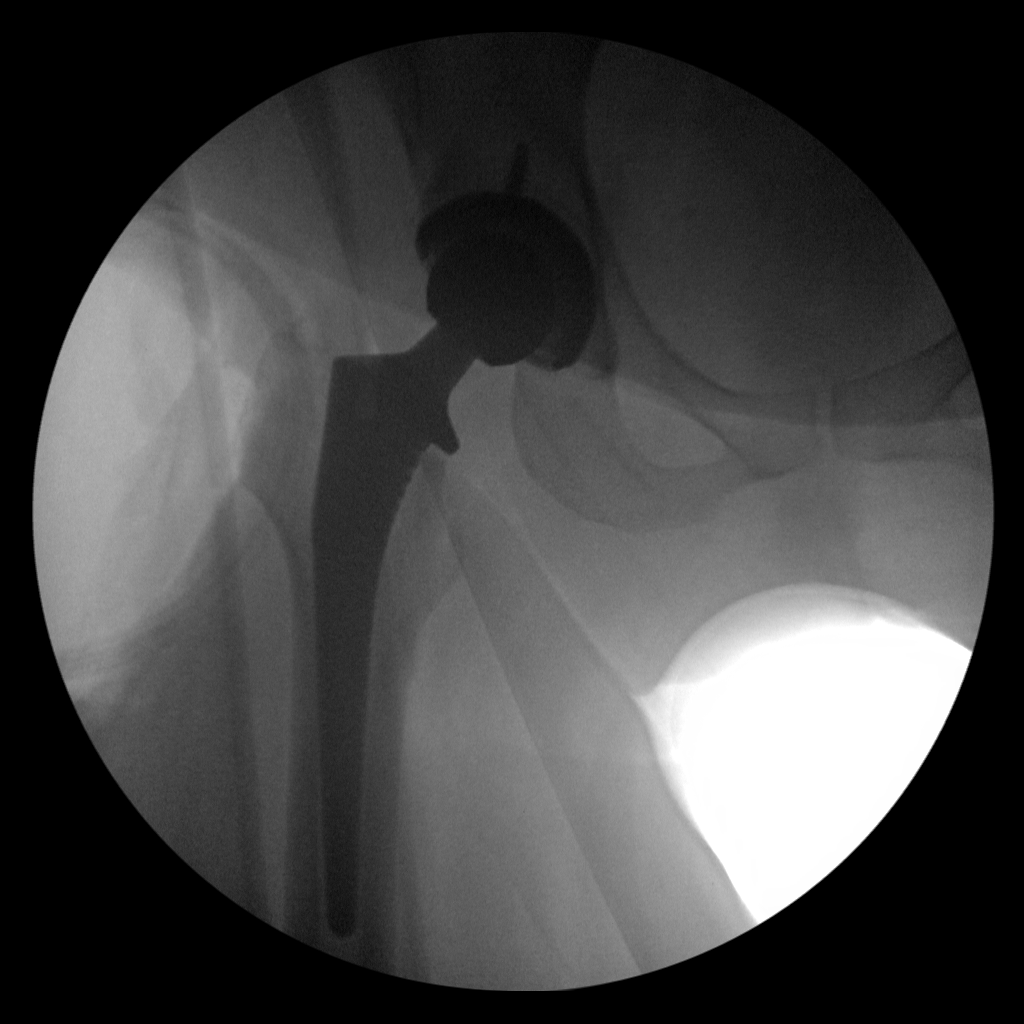
[im 2/2]
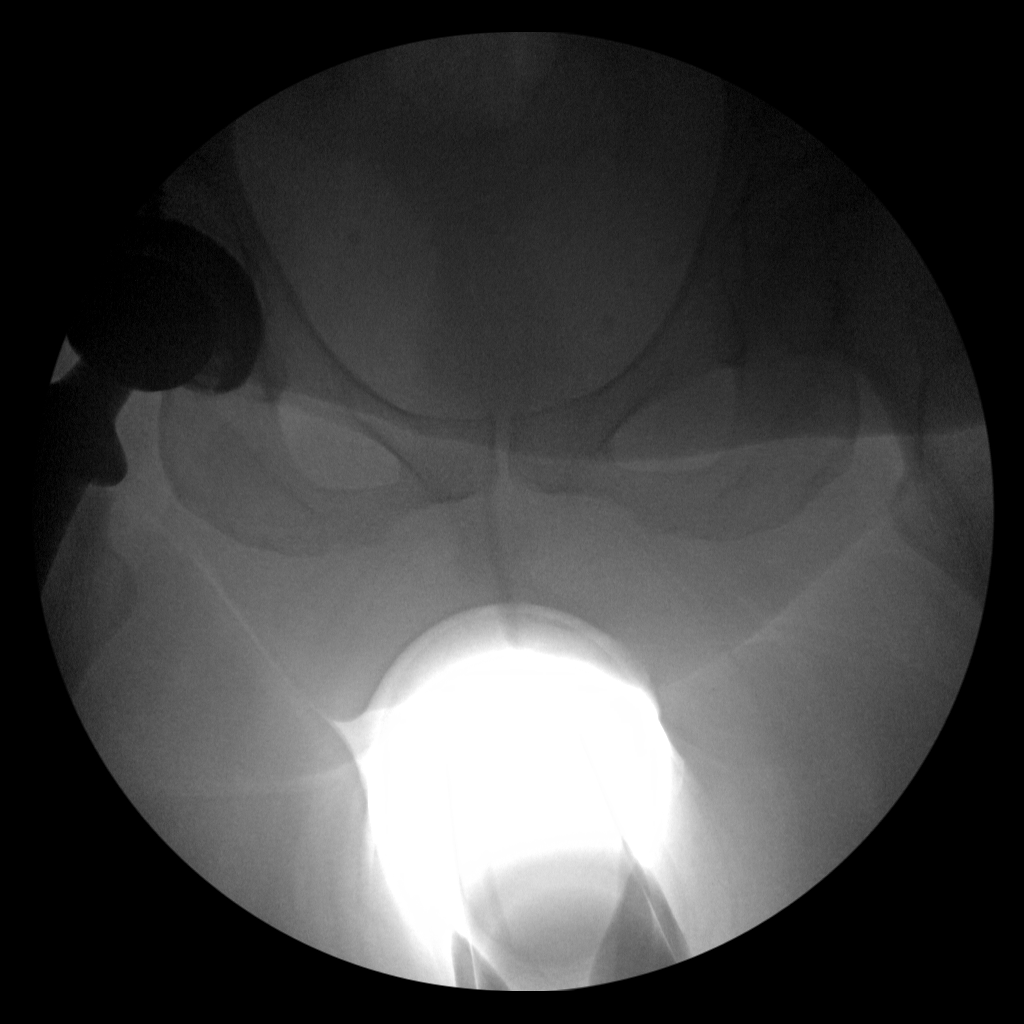

[2 of 2 positions shown; findings below may reference images not displayed]

FINDINGS: Fluoroscopy for total right hip arthroplasty. Prosthesis is located
and there is no evidence of periprosthetic fracture.
IMPRESSION: Fluoroscopy for total right hip arthroplasty. No unexpected finding.
